# Patient Record
Sex: Female | Born: 1935
Health system: Southern US, Community
[De-identification: ages and names within clinical notes are randomized; demographics above are authoritative.]

## PROBLEM LIST (undated history)

## (undated) DIAGNOSIS — I82409 Acute embolism and thrombosis of unspecified deep veins of unspecified lower extremity: Secondary | ICD-10-CM

## (undated) DIAGNOSIS — S72009A Fracture of unspecified part of neck of unspecified femur, initial encounter for closed fracture: Secondary | ICD-10-CM

## (undated) DIAGNOSIS — M810 Age-related osteoporosis without current pathological fracture: Secondary | ICD-10-CM

## (undated) DIAGNOSIS — M51369 Other intervertebral disc degeneration, lumbar region without mention of lumbar back pain or lower extremity pain: Secondary | ICD-10-CM

## (undated) DIAGNOSIS — M5136 Other intervertebral disc degeneration, lumbar region: Secondary | ICD-10-CM

## (undated) DIAGNOSIS — K5730 Diverticulosis of large intestine without perforation or abscess without bleeding: Secondary | ICD-10-CM

## (undated) DIAGNOSIS — K56609 Unspecified intestinal obstruction, unspecified as to partial versus complete obstruction: Secondary | ICD-10-CM

## (undated) DIAGNOSIS — I2699 Other pulmonary embolism without acute cor pulmonale: Secondary | ICD-10-CM

## (undated) DIAGNOSIS — I10 Essential (primary) hypertension: Secondary | ICD-10-CM

## (undated) DIAGNOSIS — C55 Malignant neoplasm of uterus, part unspecified: Secondary | ICD-10-CM

---

## 1991-04-30 DIAGNOSIS — C55 Malignant neoplasm of uterus, part unspecified: Secondary | ICD-10-CM

## 1991-04-30 HISTORY — PX: ABDOMINAL HYSTERECTOMY: SHX81

## 1991-04-30 HISTORY — DX: Malignant neoplasm of uterus, part unspecified: C55

## 1997-10-18 ENCOUNTER — Other Ambulatory Visit: Admission: RE | Admit: 1997-10-18 | Discharge: 1997-10-18 | Payer: Self-pay | Admitting: *Deleted

## 2001-03-17 ENCOUNTER — Encounter: Payer: Self-pay | Admitting: Internal Medicine

## 2001-03-17 ENCOUNTER — Ambulatory Visit (HOSPITAL_COMMUNITY): Admission: RE | Admit: 2001-03-17 | Discharge: 2001-03-17 | Payer: Self-pay | Admitting: Internal Medicine

## 2001-05-05 ENCOUNTER — Encounter: Payer: Self-pay | Admitting: Internal Medicine

## 2001-05-05 ENCOUNTER — Ambulatory Visit (HOSPITAL_COMMUNITY): Admission: RE | Admit: 2001-05-05 | Discharge: 2001-05-05 | Payer: Self-pay | Admitting: Internal Medicine

## 2001-05-14 ENCOUNTER — Encounter: Payer: Self-pay | Admitting: Internal Medicine

## 2001-05-14 ENCOUNTER — Encounter: Admission: RE | Admit: 2001-05-14 | Discharge: 2001-05-14 | Payer: Self-pay | Admitting: Internal Medicine

## 2001-05-25 ENCOUNTER — Encounter: Admission: RE | Admit: 2001-05-25 | Discharge: 2001-05-25 | Payer: Self-pay | Admitting: Internal Medicine

## 2001-05-25 ENCOUNTER — Encounter: Payer: Self-pay | Admitting: Internal Medicine

## 2001-06-08 ENCOUNTER — Encounter: Payer: Self-pay | Admitting: Internal Medicine

## 2001-06-08 ENCOUNTER — Encounter: Admission: RE | Admit: 2001-06-08 | Discharge: 2001-06-08 | Payer: Self-pay | Admitting: Internal Medicine

## 2002-03-17 ENCOUNTER — Encounter: Payer: Self-pay | Admitting: Internal Medicine

## 2002-03-17 ENCOUNTER — Ambulatory Visit (HOSPITAL_COMMUNITY): Admission: RE | Admit: 2002-03-17 | Discharge: 2002-03-17 | Payer: Self-pay | Admitting: Internal Medicine

## 2002-09-01 ENCOUNTER — Encounter: Payer: Self-pay | Admitting: Radiology

## 2002-09-01 ENCOUNTER — Encounter: Payer: Self-pay | Admitting: Internal Medicine

## 2002-09-01 ENCOUNTER — Encounter: Admission: RE | Admit: 2002-09-01 | Discharge: 2002-09-01 | Payer: Self-pay | Admitting: Internal Medicine

## 2002-09-15 ENCOUNTER — Encounter: Admission: RE | Admit: 2002-09-15 | Discharge: 2002-09-15 | Payer: Self-pay | Admitting: Internal Medicine

## 2002-09-15 ENCOUNTER — Encounter: Payer: Self-pay | Admitting: Internal Medicine

## 2002-09-29 ENCOUNTER — Encounter: Admission: RE | Admit: 2002-09-29 | Discharge: 2002-09-29 | Payer: Self-pay | Admitting: Internal Medicine

## 2002-09-29 ENCOUNTER — Encounter: Payer: Self-pay | Admitting: Internal Medicine

## 2003-03-21 ENCOUNTER — Ambulatory Visit (HOSPITAL_COMMUNITY): Admission: RE | Admit: 2003-03-21 | Discharge: 2003-03-21 | Payer: Self-pay | Admitting: Internal Medicine

## 2003-03-30 ENCOUNTER — Ambulatory Visit (HOSPITAL_COMMUNITY): Admission: RE | Admit: 2003-03-30 | Discharge: 2003-03-30 | Payer: Self-pay | Admitting: Internal Medicine

## 2004-04-19 ENCOUNTER — Ambulatory Visit (HOSPITAL_COMMUNITY): Admission: RE | Admit: 2004-04-19 | Discharge: 2004-04-19 | Payer: Self-pay | Admitting: Internal Medicine

## 2004-04-29 HISTORY — PX: CHOLECYSTECTOMY: SHX55

## 2004-10-04 ENCOUNTER — Ambulatory Visit (HOSPITAL_COMMUNITY): Admission: RE | Admit: 2004-10-04 | Discharge: 2004-10-04 | Payer: Self-pay | Admitting: Internal Medicine

## 2004-11-14 ENCOUNTER — Ambulatory Visit: Payer: Self-pay | Admitting: Internal Medicine

## 2004-11-19 ENCOUNTER — Ambulatory Visit (HOSPITAL_COMMUNITY): Admission: RE | Admit: 2004-11-19 | Discharge: 2004-11-19 | Payer: Self-pay | Admitting: Internal Medicine

## 2004-12-17 ENCOUNTER — Ambulatory Visit: Payer: Self-pay | Admitting: Internal Medicine

## 2004-12-17 ENCOUNTER — Observation Stay (HOSPITAL_COMMUNITY): Admission: AD | Admit: 2004-12-17 | Discharge: 2004-12-18 | Payer: Self-pay | Admitting: Internal Medicine

## 2004-12-17 ENCOUNTER — Ambulatory Visit (HOSPITAL_COMMUNITY): Admission: RE | Admit: 2004-12-17 | Discharge: 2004-12-17 | Payer: Self-pay | Admitting: Ophthalmology

## 2004-12-21 ENCOUNTER — Emergency Department (HOSPITAL_COMMUNITY): Admission: EM | Admit: 2004-12-21 | Discharge: 2004-12-22 | Payer: Self-pay | Admitting: *Deleted

## 2004-12-26 ENCOUNTER — Ambulatory Visit: Payer: Self-pay | Admitting: Internal Medicine

## 2005-01-21 ENCOUNTER — Encounter (HOSPITAL_COMMUNITY): Admission: RE | Admit: 2005-01-21 | Discharge: 2005-01-21 | Payer: Self-pay | Admitting: Surgery

## 2005-02-11 ENCOUNTER — Ambulatory Visit (HOSPITAL_COMMUNITY): Admission: RE | Admit: 2005-02-11 | Discharge: 2005-02-11 | Payer: Self-pay | Admitting: Ophthalmology

## 2005-03-08 ENCOUNTER — Ambulatory Visit (HOSPITAL_COMMUNITY): Admission: RE | Admit: 2005-03-08 | Discharge: 2005-03-09 | Payer: Self-pay | Admitting: Surgery

## 2005-03-08 ENCOUNTER — Encounter (INDEPENDENT_AMBULATORY_CARE_PROVIDER_SITE_OTHER): Payer: Self-pay | Admitting: *Deleted

## 2005-04-25 ENCOUNTER — Ambulatory Visit (HOSPITAL_COMMUNITY): Admission: RE | Admit: 2005-04-25 | Discharge: 2005-04-25 | Payer: Self-pay | Admitting: Internal Medicine

## 2005-04-26 ENCOUNTER — Ambulatory Visit (HOSPITAL_COMMUNITY): Admission: RE | Admit: 2005-04-26 | Discharge: 2005-04-26 | Payer: Self-pay | Admitting: Internal Medicine

## 2005-04-29 DIAGNOSIS — K56609 Unspecified intestinal obstruction, unspecified as to partial versus complete obstruction: Secondary | ICD-10-CM

## 2005-04-29 HISTORY — PX: ABDOMINAL ADHESION SURGERY: SHX90

## 2005-04-29 HISTORY — PX: BOWEL RESECTION: SHX1257

## 2005-04-29 HISTORY — PX: APPENDECTOMY: SHX54

## 2005-04-29 HISTORY — DX: Unspecified intestinal obstruction, unspecified as to partial versus complete obstruction: K56.609

## 2005-04-29 HISTORY — PX: HIP SURGERY: SHX245

## 2005-08-27 ENCOUNTER — Ambulatory Visit (HOSPITAL_COMMUNITY): Admission: RE | Admit: 2005-08-27 | Discharge: 2005-08-27 | Payer: Self-pay | Admitting: Internal Medicine

## 2005-09-04 ENCOUNTER — Ambulatory Visit: Payer: Self-pay | Admitting: Internal Medicine

## 2005-09-12 ENCOUNTER — Ambulatory Visit: Payer: Self-pay | Admitting: Internal Medicine

## 2005-09-12 ENCOUNTER — Ambulatory Visit (HOSPITAL_COMMUNITY): Admission: RE | Admit: 2005-09-12 | Discharge: 2005-09-12 | Payer: Self-pay | Admitting: Internal Medicine

## 2005-09-17 ENCOUNTER — Ambulatory Visit (HOSPITAL_COMMUNITY): Admission: RE | Admit: 2005-09-17 | Discharge: 2005-09-17 | Payer: Self-pay | Admitting: Internal Medicine

## 2005-10-01 ENCOUNTER — Ambulatory Visit (HOSPITAL_COMMUNITY): Admission: RE | Admit: 2005-10-01 | Discharge: 2005-10-01 | Payer: Self-pay | Admitting: Internal Medicine

## 2005-10-02 ENCOUNTER — Ambulatory Visit (HOSPITAL_COMMUNITY): Admission: RE | Admit: 2005-10-02 | Discharge: 2005-10-02 | Payer: Self-pay | Admitting: Internal Medicine

## 2005-11-21 ENCOUNTER — Ambulatory Visit: Payer: Self-pay | Admitting: Internal Medicine

## 2006-02-03 ENCOUNTER — Encounter (INDEPENDENT_AMBULATORY_CARE_PROVIDER_SITE_OTHER): Payer: Self-pay | Admitting: *Deleted

## 2006-02-03 ENCOUNTER — Inpatient Hospital Stay (HOSPITAL_COMMUNITY): Admission: RE | Admit: 2006-02-03 | Discharge: 2006-02-10 | Payer: Self-pay | Admitting: Surgery

## 2006-03-10 IMAGING — NM NM HEPATO W/GB/PHARM/[PERSON_NAME]
2 series · 12 of 12 positions shown · non-contrast
Comparison: none

CLINICAL DATA: Epigastric pain.
 NUCLEAR MEDICINE HEPATOBILIARY SCAN WITH EJECTION FRACTION:
TECHNIQUE: Sequential abdominal images were obtained following intravenous injection of radiopharmaceutical.  Sequential images were continued following oral ingestion of 8 oz. half-and-half, and the gallbladder ejection fraction was calculated.
 Radiopharmaceutical:   5.0 mCi 2c-77m Choletec.

[Series 1: hepatobiliary · 3.20mm/px · 6 of 60 frames shown (1 of 2)]
[frame 6/60]
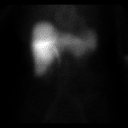
[frame 16/60]
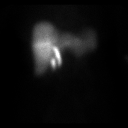
[frame 26/60]
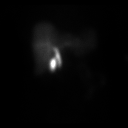
[frame 36/60]
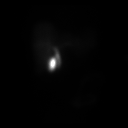
[frame 46/60]
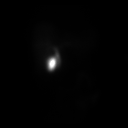
[frame 56/60]
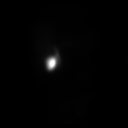

[Series 1: hepatobiliary · 3.20mm/px · 6 of 60 frames shown (2 of 2)]
[frame 6/60]
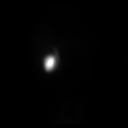
[frame 16/60]
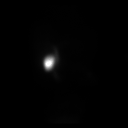
[frame 26/60]
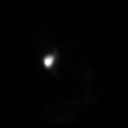
[frame 36/60]
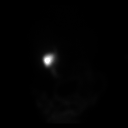
[frame 46/60]
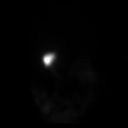
[frame 56/60]
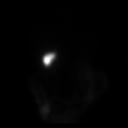

[12 of 12 positions shown; findings below may reference images not displayed]

FINDINGS: Prompt tracer extraction from blood stream indicating normal hepatocellular function.  
 Prompt excretion of tracer into the biliary tree.
 Gallbladder visualized by 15 minutes.
 Small bowel visualized by 25 minutes.
 No hepatic retention of tracer.  
 At one hour, the patient ingested Half and Half and imaging was continued for 60 minutes.
 Old mild emptying of tracer occurs from gallbladder following fatty meal stimulation.  
 Calculated gallbladder ejection fraction is decreased at 32%.
IMPRESSION: Patent biliary tree.
 Abnormal gallbladder response to fatty meal stimulation with decreased ejection fraction of 32%.

## 2006-04-25 ENCOUNTER — Ambulatory Visit: Payer: Self-pay | Admitting: Internal Medicine

## 2006-04-28 ENCOUNTER — Ambulatory Visit (HOSPITAL_COMMUNITY): Admission: RE | Admit: 2006-04-28 | Discharge: 2006-04-28 | Payer: Self-pay | Admitting: Internal Medicine

## 2006-08-27 ENCOUNTER — Ambulatory Visit: Payer: Self-pay | Admitting: Internal Medicine

## 2006-09-24 ENCOUNTER — Ambulatory Visit: Payer: Self-pay | Admitting: Internal Medicine

## 2007-05-21 ENCOUNTER — Ambulatory Visit (HOSPITAL_COMMUNITY): Admission: RE | Admit: 2007-05-21 | Discharge: 2007-05-21 | Payer: Self-pay | Admitting: Internal Medicine

## 2008-05-23 ENCOUNTER — Ambulatory Visit (HOSPITAL_COMMUNITY): Admission: RE | Admit: 2008-05-23 | Discharge: 2008-05-23 | Payer: Self-pay | Admitting: Internal Medicine

## 2008-12-31 ENCOUNTER — Inpatient Hospital Stay (HOSPITAL_COMMUNITY): Admission: EM | Admit: 2008-12-31 | Discharge: 2009-01-05 | Payer: Self-pay | Admitting: Orthopedic Surgery

## 2008-12-31 ENCOUNTER — Encounter: Payer: Self-pay | Admitting: Emergency Medicine

## 2009-01-05 ENCOUNTER — Inpatient Hospital Stay: Admission: AD | Admit: 2009-01-05 | Discharge: 2009-01-17 | Payer: Self-pay | Admitting: Internal Medicine

## 2009-06-05 ENCOUNTER — Ambulatory Visit (HOSPITAL_COMMUNITY): Admission: RE | Admit: 2009-06-05 | Discharge: 2009-06-05 | Payer: Self-pay | Admitting: Internal Medicine

## 2009-06-06 ENCOUNTER — Encounter (HOSPITAL_COMMUNITY): Admission: RE | Admit: 2009-06-06 | Discharge: 2009-07-06 | Payer: Self-pay | Admitting: Internal Medicine

## 2009-06-06 ENCOUNTER — Inpatient Hospital Stay (HOSPITAL_COMMUNITY): Admission: AD | Admit: 2009-06-06 | Discharge: 2009-06-10 | Payer: Self-pay | Admitting: Internal Medicine

## 2010-05-19 ENCOUNTER — Encounter: Payer: Self-pay | Admitting: Internal Medicine

## 2010-05-20 ENCOUNTER — Encounter: Payer: Self-pay | Admitting: Internal Medicine

## 2010-07-18 LAB — BETA-2-GLYCOPROTEIN I ABS, IGG/M/A
Beta-2 Glyco I IgG: <3 U/mL (ref <=15)
Beta-2-Glycoprotein I IgA: <3 U/mL (ref <=15)

## 2010-07-18 LAB — LUPUS ANTICOAGULANT PANEL: PTT Lupus Anticoagulant: 38.3 secs (ref 32.0–43.4)

## 2010-07-18 LAB — PROTIME-INR
INR: 1.1 (ref 0.00–1.49)
INR: 1.16 (ref 0.00–1.49)
INR: 2.14 — ABNORMAL HIGH (ref 0.00–1.49)
Prothrombin Time: 14.1 seconds (ref 11.6–15.2)
Prothrombin Time: 14.7 seconds (ref 11.6–15.2)
Prothrombin Time: 23.4 seconds — ABNORMAL HIGH (ref 11.6–15.2)

## 2010-07-18 LAB — BASIC METABOLIC PANEL
BUN: 25 mg/dL — ABNORMAL HIGH (ref 6–23)
CO2: 24 mEq/L (ref 19–32)
Calcium: 8.4 mg/dL (ref 8.4–10.5)
Chloride: 103 mEq/L (ref 96–112)
Creatinine, Ser: 1.27 mg/dL — ABNORMAL HIGH (ref 0.4–1.2)
GFR calc Af Amer: 48 mL/min — ABNORMAL LOW (ref >60)
Glucose, Bld: 103 mg/dL — ABNORMAL HIGH (ref 70–99)

## 2010-07-18 LAB — CBC
MCHC: 33.3 g/dL (ref 30.0–36.0)
MCV: 87.9 fL (ref 78.0–100.0)
RDW: 13.8 % (ref 11.5–15.5)

## 2010-07-18 LAB — PROTEIN C ACTIVITY: Protein C Activity: 160 % — ABNORMAL HIGH (ref 75–133)

## 2010-07-18 LAB — PROTEIN C, TOTAL: Protein C, Total: 98 % (ref 70–140)

## 2010-07-18 LAB — PROTEIN S, TOTAL: Protein S Ag, Total: 105 % (ref 70–140)

## 2010-07-18 LAB — PROTEIN S ACTIVITY: Protein S Activity: 87 % (ref 69–129)

## 2010-07-18 LAB — FACTOR 5 LEIDEN

## 2010-07-18 LAB — HOMOCYSTEINE: Homocysteine: 12.7 umol/L (ref 4.0–15.4)

## 2010-07-18 LAB — ANTITHROMBIN III: AntiThromb III Func: 95 % (ref 76–126)

## 2010-08-03 LAB — BASIC METABOLIC PANEL
BUN: 12 mg/dL (ref 6–23)
BUN: 13 mg/dL (ref 6–23)
CO2: 25 mEq/L (ref 19–32)
Chloride: 108 mEq/L (ref 96–112)
Creatinine, Ser: 1.24 mg/dL — ABNORMAL HIGH (ref 0.4–1.2)
Creatinine, Ser: 1.24 mg/dL — ABNORMAL HIGH (ref 0.4–1.2)
GFR calc non Af Amer: 41 mL/min — ABNORMAL LOW (ref >60)
Glucose, Bld: 96 mg/dL (ref 70–99)
Potassium: 3.8 mEq/L (ref 3.5–5.1)

## 2010-08-03 LAB — COMPREHENSIVE METABOLIC PANEL
AST: 35 U/L (ref 0–37)
Albumin: 3.9 g/dL (ref 3.5–5.2)
Alkaline Phosphatase: 48 U/L (ref 39–117)
BUN: 19 mg/dL (ref 6–23)
CO2: 20 mEq/L (ref 19–32)
Chloride: 107 mEq/L (ref 96–112)
Creatinine, Ser: 1.58 mg/dL — ABNORMAL HIGH (ref 0.4–1.2)
GFR calc Af Amer: 37 mL/min — ABNORMAL LOW (ref >60)
GFR calc non Af Amer: 31 mL/min — ABNORMAL LOW (ref >60)
Potassium: 3.9 mEq/L (ref 3.5–5.1)
Total Bilirubin: 0.7 mg/dL (ref 0.3–1.2)

## 2010-08-03 LAB — POCT CARDIAC MARKERS
CKMB, poc: 2.3 ng/mL (ref 1.0–8.0)
Myoglobin, poc: 286 ng/mL (ref 12–200)

## 2010-08-03 LAB — CBC
HCT: 26.4 % — ABNORMAL LOW (ref 36.0–46.0)
HCT: 36.7 % (ref 36.0–46.0)
MCHC: 33.3 g/dL (ref 30.0–36.0)
MCHC: 34.1 g/dL (ref 30.0–36.0)
MCV: 90.9 fL (ref 78.0–100.0)
MCV: 92 fL (ref 78.0–100.0)
MCV: 92.7 fL (ref 78.0–100.0)
Platelets: 161 10*3/uL (ref 150–400)
Platelets: 165 10*3/uL (ref 150–400)
Platelets: 189 10*3/uL (ref 150–400)
Platelets: 223 10*3/uL (ref 150–400)
RBC: 4.04 MIL/uL (ref 3.87–5.11)
WBC: 16.8 10*3/uL — ABNORMAL HIGH (ref 4.0–10.5)
WBC: 8.8 10*3/uL (ref 4.0–10.5)
WBC: 9.2 10*3/uL (ref 4.0–10.5)
WBC: 9.4 10*3/uL (ref 4.0–10.5)

## 2010-08-03 LAB — TYPE AND SCREEN: ABO/RH(D): AB POS

## 2010-08-03 LAB — URINALYSIS, ROUTINE W REFLEX MICROSCOPIC
Bilirubin Urine: NEGATIVE
Glucose, UA: NEGATIVE mg/dL
Ketones, ur: NEGATIVE mg/dL
Protein, ur: NEGATIVE mg/dL
pH: 5 (ref 5.0–8.0)

## 2010-08-03 LAB — DIFFERENTIAL
Basophils Absolute: 0 10*3/uL (ref 0.0–0.1)
Basophils Relative: 0 % (ref 0–1)
Eosinophils Relative: 0 % (ref 0–5)
Monocytes Absolute: 0.8 10*3/uL (ref 0.1–1.0)

## 2010-08-03 LAB — URINE MICROSCOPIC-ADD ON

## 2010-09-11 NOTE — Assessment & Plan Note (Signed)
NAME:  [name], GREENSPAN                 CHART#:  [id number]   DATE:  09/24/2006                       DOB:  Nov 24, 1918   PRESENTING COMPLAINT:  Follow up for diarrhea.   SUBJECTIVE:  The patient is an 77 year old Caucasian female who was last  seen on 08/27/2006 for diarrhea. She has had this problem since she had  resection of one foot of distal small bowel back in August 2007.  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  She has  been gradually losing weight.  She has lost 13 pounds in two years and 2  pounds in the last four weeks.   She had normal cbc, serum albumin and TSH levels.  She was given  prescription for Lomotil.  She was also given AnaMantle HC forte for  perianal application because of irritation due to her diarrhea.   She states she is starting to feel better.  She is still having as many  as six stools on her worst days.  She has had three on her best days and  averages three to four.  The first stool is always formed, second stool  is semi-formed and subsequent stools may be loose to watery.  She has  not passed any blood.  She denies abdominal pain.  She feels she has a  good appetite.  She is on low acid diet.  She has not experienced any  nausea or vomiting or abdominal pain that she was experiencing  intermittently prior to the surgery.  She reports no side effects with  Lomotil.   She is on MVI q.d., vitamin D 400 unites daily, calcium 1.8 gm daily,  ASA 81 mg daily, Benicar HCT 20 daily, Lomotil 1 b.i.d. p.r.n.   OBJECTIVE:  Weight 102 pounds, she is 5 feet 3 inches tall, pulse 88 per  minute, blood pressure 120/60, temperature 98.1. conjunctiva is pink,  sclera is nonicteric, oropharyngeal mucosa is normal.  No thyromegaly or  lymphadenopathy.  Bowel sounds are hyperactive.  Abdomen is soft and  nontender.  Aorta is easily palpated at epigastrium but not expanded.  She does not have clubbing, peripheral edema or skin rash.   ASSESSMENT:  Diarrhea since she had one foot of her distal small bowel  resected over six months ago.  Typically removal of this long a segment  should not cause diarrhea.  I believe the reason she has developed  diarrhea is because of underlying radiation enteritis.  It is reassuring  to know that she is improving with minimal intervention.  She is on low  dose and Lomotil and not having any problems.  I doubt that she will  need it chronically.  My only concern is gradual weight loss.  I would  like to see the trend reverse before we say that everything is back to  normal.  If weight loss continues, she may have to be evaluated for  malabsorption.   PLAN:  1. I would like for her to try to increase calorie intake.  She can      try yogurt, cheese, etc.  2. New prescription given for Lomotil 1 b.i.d. p.r.n. #60 with 1      refill.  She will stop by for a      weight check in one  month.  If weight loss continues, we will      proceed with B12, folate levels as well as trial with Xifaxan for      ten days prior to considering quantitative fecal fat analysis.       Hildred Laser, M.D.  Electronically Signed     NR/MEDQ  D:  09/24/2006  T:  09/25/2006  Job:  [id number]   cc:   Paula Compton. Willey Blade, MD

## 2010-09-14 NOTE — Op Note (Signed)
NAMELAKYSHA, RIVELLO                ACCOUNT NO.:  [id number]   MEDICAL RECORD NO.:  [id number]          PATIENT TYPE:  INP   LOCATION:  1410                         FACILITY:  [facility]   PHYSICIAN:  Haywood Lasso, M.D.DATE OF BIRTH:  Apr 05, 1919   DATE OF PROCEDURE:  02/03/2006  DATE OF DISCHARGE:                                 OPERATIVE REPORT   OFFICE MEDICAL RECORD NUMBER [id number] [phone].   PREOPERATIVE DIAGNOSIS:  Intermittent partial small bowel obstruction  secondary to adhesions.   POSTOPERATIVE DIAGNOSIS:  Intermittent partial small bowel obstruction  secondary to adhesions.  Plus radiation enteritis causing additional partial  small-bowel obstruction.   OPERATION:  Diagnostic laparoscopy with lysis of adhesions, accidental  enterotomy, exploratory laparotomy with repair of enterotomy, lysis of  additional adhesions, resection of terminal ileum with primary anastomosis,  incidental appendectomy.   SURGEON:  Dr. Margot Chimes   ASSISTANT:  Dr. Rise Patience   ANESTHESIA:  General endotracheal.   CLINICAL HISTORY:  Ms. Niebur is a 77 year old lady with intermittent  episodes that sounded suggestive of partial small-bowel obstruction and in  fact a series had shown a transition zone.  However, she would  intermittently be well and then had these episodes perhaps once every 6-8  weeks.  After having her most recent episode.  She elected to proceed to  surgical intervention.  We plan to do a diagnostic laparoscopy, lysis of  adhesions with possible laparotomy should that be needed.   DESCRIPTION OF PROCEDURE:  The patient was seen in the holding area.  She  had no further questions and we reviewed again the plans for surgery.  She  is taken to the operating room after satisfactory general endotracheal  anesthesia had been obtained, a Foley catheter was placed and the abdomen  prepped and draped.  The time-out occurred.   A small umbilical incision was made, a pursestring placed, Hasson  introduced, and the abdomen insufflated to 15.  The camera was placed.  I  could see multiple omental adhesions to the lower midline from the umbilicus  down.  I placed a 5-mm trocar in the right upper quadrant and one in the  right lower quadrant and put the camera with a 5 mm camera in the right  upper quadrant.  We began taking these down with the harmonic scalpel.  As  we got some of these down I went ahead and put a left lower quadrant 5 mm  trocar in so we could have better direction to work with and eventually got  all of these omental adhesions down.   At this point using [facility] dissectors, I was able to identify the terminal  ileum and went down to the pelvis and there seemed to be some adhesions down  there which initially I could not really evaluate any further.  I then began  to run the small bowel very gradually using the Glassman's in a hand-over-  hand technique.  Unfortunately about halfway up the small bowel we made a  tiny enterotomy.  At that point I elected to go ahead and open because I was  unclear about what I was going to need to do in the right lower quadrant and  felt this would be the best way to repair the small enterotomy.  A short  midline laparotomy was made.  The small area in the small bowel was  immediately identified and repaired with three sutures of 3-0 silk  Lambert's.   I continued running the small bowel proximally and found no other problems.  I then ran it distally and we encountered what we had noticed initially  which was radiation enteritis.  Near the terminal ileum there is a loop of  small bowel stuck down on another loop and appeared to be tethered.  Using  scissors I sharply divided the tether and freed up that loop and there was  what appeared to be a band-like across the small bowel here with an  impression on the small bowel. I think this was probably the majority of her  symptoms and intermittent obstruction at this point.  However  second loop of  more distal terminal ileum was also stuck down to what looked like the old  vaginal cuff or the bladder flap from her prior hysterectomy.  I went ahead  and sharply freed that up and it was very densely adherent to that bladder  flap.  Having gotten this up, this was very diseased bowel with severe  radiation enteritis and I thought probably was not best left in situ.  I  therefore decided to resect about a foot of her terminal ileum to get the  worst of this bowel out.   I made a small window in the mesentery of the small bowel at the ileocecal  junction and divided the small bowel with the GIA.  I then picked a site  proximal to where the tethering had been noted and divided the mesentery  with the LigaSure to that point.  I divided small bowel again with the GIA.   The appendix was sclerotic but I elected to go ahead and take this out at  this point and freed it up and divided it over a Kelly clamp, ligating the  base with 2-0 chromic and inverting it with a 3-0 silk.   Attention was then turned to doing an anastomosis.  I tacked the  antimesenteric border of the small bowel up against the cecum along the  tinea, impacted again further distally.  I opened up both the small bowel  and the colon, inserted the GIA and fired it.  This produced a nice long  staple line and there is no bleeding.   The common defect was tacked closed with a couple of 3-0 silks and the TA-60  used to close the common defect.  It was reinforced with some Lambert  sutures tucking this nicely.   At this point instruments and gloves were changed.  The mesenteric defect  was closed with some 3-0 silks.   I copiously irrigated, but there had been basically minimal contamination  with less than a mL of small bowel content having spilled from the  enterotomy that we had made earlier and essentially no enteric contents  spilled when we did the anastomosis.   I looked back at the raw surface down  by bladder flap and tried to  reperitonealize this with some 3-0 silk also to be sure we had good  hemostasis there.  At this point everything appeared to be dry.  We had a  nice patent anastomosis.   I used some  Seprafilm type material to place a small sheet up against the  raw surface where we had taken the small bowel off the bladder to see if we  could prevent another loop of small bowel sticking to that.  I then laid the  bowel gently back in its normal anatomic position and put the omentum down  over the small bowel so that this would protect the wound.  I also put the  other portion of the Seprafilm material across and on top of the omentum.  I  also inspected the omentum and there was no bleeding.   The wound was then closed with a running #1 fascial suture of Prolene.  The  wound was closed with staples on the skin with some Telfa wicks.   The patient tolerated procedure well.  There were no complications except  the single enterotomy made as mentioned above.  Estimated blood loss 150 mL.  All counts were correct.      Haywood Lasso, M.D.  Electronically Signed     CJS/MEDQ  D:  02/03/2006  T:  02/05/2006  Job:  [id number]   cc:   Paula Compton. Willey Blade, MD  [phone]   Hildred Laser, M.D.  [id number]  [place]  [place] [address]

## 2010-09-14 NOTE — Op Note (Signed)
NAMETARRIN, LECKNER                ACCOUNT NO.:  [id number]   MEDICAL RECORD NO.:  [id number]          PATIENT TYPE:  OIB   LOCATION:  [id number]                         FACILITY:  [facility]   PHYSICIAN:  Haywood Lasso, M.D.DATE OF BIRTH:  1918-12-31   DATE OF PROCEDURE:  03/08/2005  DATE OF DISCHARGE:                                 OPERATIVE REPORT   Office medical record #[id number].   PREOP DIAGNOSIS:  Biliary dyskinesia and biliary colic.   POSTOPERATIVE DIAGNOSIS:  Biliary dyskinesia and biliary colic.   OPERATION:  Laparoscopic cholecystectomy with operative cholangiogram.   SURGEON:  Haywood Lasso, M.D.   ASSISTANT:  Edsel Petrin. Dalbert Batman, M.D.   ANESTHESIA:  General.   CLINICAL HISTORY:  This is an 15-year lady who has intermittently been  having problems with abdominal pain, not too long ago.  Admitted, apparent  workup really could not confirmed gallstones, but she has had some decreased  gallbladder ejection fraction; and it was thought perhaps some of her  symptoms were biliary in origin. After discussion with the patient she  wished to proceed to cholecystectomy.   DESCRIPTION OF PROCEDURE:  The patient was seen in the holding area; and she  had no further questions. We confirmed that cholecystectomy was the planned  procedure.  The patient was taken to the operating room and after  satisfactory general endotracheal anesthesia had been obtained the abdomen  was prepped and draped. The time-out occurred.   I used 0.25% plain Marcaine for each incision. The umbilical incision was  made, the fascia opened; and the peritoneal cavity entered. A pursestring  was placed, the Hasson introduced; and the abdomen insufflated to 15. A  10/11 trocar was placed in the epigastrium and two 5 mm laterally under  direct vision. I was able to check and see that there were a few midline  adhesions but these had not interfered with the abdominal entry and did  appear to have any  bowel involved, just a few omental adhesions from her  prior surgery.   With the patient placed in reverse Trendelenburg and tilted to the left, the  gallbladder was grasped and retracted over the liver. The peritoneum over  the triangle of Calot was opened and the duct and artery dissected out; and  a large window made. There was a little delay waiting for the radiology tech  to come, so I freed up the peritoneum of the gallbladder in both directions;  and made a very large window while we were waiting on radiology. Once they  appeared, I went ahead and put a clip on the cystic duct at its junction  with the gallbladder, having dissected that out for its length. The main  cystic artery was also clipped and a small branch higher up also clipped.   The cystic duct was opened and a Cook catheter introduced percutaneously and  slipped into the cystic duct. It was held with a clip and operative  angiography done. There appeared to be some narrowing or spasm of the distal  duct or  perhaps a little bit of a  stricture, but ready flow of dye into the  duodenum. There were no stones noted; and no filling defects in the liver.   The cystic duct catheter was removed and three clips placed on the cystic  duct and it was divided. The cystic artery had additional clips placed on it  and it was divided. The small branch that we had previously clipped was then  divided. The gallbladder was then removed from below-to-above using  coagulation current of the cautery. Once this was disconnected, we irrigated  and made sure that everything was dry. The gallbladder was then brought out  the umbilical port. The abdomen was reinsufflated and the final check for  hemostasis made; and, again, everything appeared fine. The lateral ports  were removed under direct vision. The umbilical port was closed with a  pursestring. The abdomen was deflated through the epigastric port. Skin was  closed with 4-0 Monocryl  subcuticular plus Dermabond.   The patient tolerated the procedure well. There no operative complications.  All counts were correct.      Haywood Lasso, M.D.  Electronically Signed     CJS/MEDQ  D:  03/08/2005  T:  03/08/2005  Job:  [id number]   cc:   Paula Compton. Willey Blade, MD  Fax: [phone]

## 2010-09-14 NOTE — Op Note (Signed)
[name], [name]                ACCOUNT NO.:  [id number]   MEDICAL RECORD NO.:  [id number]          PATIENT TYPE:  AMB   LOCATION:  DAY                           FACILITY:  [facility]   PHYSICIAN:  Hildred Laser, M.D.    DATE OF BIRTH:  1919-02-26   DATE OF PROCEDURE:  09/12/2005  DATE OF DISCHARGE:                                 OPERATIVE REPORT   PROCEDURE:  Colonoscopy.   INDICATION:  [name] is an 76 year old Caucasian female who is undergoing  colonoscopy primarily for screening purposes.  Her last colonoscopy was over  ten years ago.  She is having intermittent episodes of severe abdominal pain  with nausea and vomiting.  She has undergone multiple studies without a  definite diagnosis.  She also had a laparoscopic cholecystectomy last year.  She had CT angio recently. She has disease to the celiac trunk and SMA,  possibly insignificant.  She also had dilated loops of small bowel with a  transition zone. Prior small bowel study was normal except for mildly  dilated loops of small bowel but this was over six months ago.  She will  have this study repeated in the future.  The procedure was reviewed with the  patient, informed consent was obtained.   MEDS FOR CONSCIOUS SEDATION:  Demerol 25 mg IV and Versed 3 mg IV.   FINDINGS:  The procedure was performed in the endoscopy suite.  The  patient's vital signs and O2 sat were monitored during the procedure and  remained stable.  The patient was placed in the left lateral position.  A  rectal examination was performed.  No abnormality was noted on external or  digital exam.  The Olympus videoscope was placed in the rectum and advanced  under vision into the sigmoid colon and beyond.  She had a few small  diverticula at the sigmoid colon.  Preparation was satisfactory to fair.  She had some areas noted with stool that had to be washed away.  The scope  was passed into the cecum which was identified by the ileocecal valve.  She  had to  be turned on the right side in order to look beyond the cecum.  A  picture of the ileocecal valve.  She had to be turned on the right side in  order to see beyond the cecum which was normal.  A picture of the  appendiceal orifice was taken for the record.  As the scope was withdrawn,  the colonic mucosa was examined for a second time and was normal throughout.  The rectal mucosa similarly was normal.  The scope was retroflexed to  examine the anorectal junction which was unremarkable.  The endoscope was  then withdrawn.  The patient tolerated the procedure well.   FINAL DIAGNOSIS:  A few tiny diverticula of the sigmoid colon, otherwise,  normal colonoscopy.   RECOMMENDATIONS:  She will have a small bowel follow through to further  evaluate the dilated loops of small bowel on recent CT angio.  I will also  have one of the interventional radiologists at [facility] to review  her CT angio  and see if it would be worthwhile to consider angiogram.      Hildred Laser, M.D.  Electronically Signed     NR/MEDQ  D:  09/12/2005  T:  09/12/2005  Job:  [id number]   cc:   Paula Compton. Willey Blade, MD  Fax: [phone]

## 2010-09-14 NOTE — H&P (Signed)
NAME:  [name], KOHEN                ACCOUNT NO.:  [id number]   MEDICAL RECORD NO.:  [id number]          PATIENT TYPE:  OBV   LOCATION:  A330                          FACILITY:  [facility]   PHYSICIAN:  Hildred Laser, M.D.    DATE OF BIRTH:  04/21/1919   DATE OF ADMISSION:  12/17/2004  DATE OF DISCHARGE:  LH                                HISTORY & PHYSICAL   PRESENTING COMPLAINT:  Abdominal pain and nausea and vomiting starting at 2  a.m. this morning.   HISTORY OF PRESENT ILLNESS:  [name] is an 77 year old Caucasian female  patient of Dr. Ria Comment who was last evaluated on November 14, 2004 for recurrent  episodes of upper and mid abdominal pain associated with nausea and  vomiting.  She has been getting these episodes every four-six months and  they have been going on for the last few years.  A prior evaluation by Dr.  Willey Blade had included an ultrasound on October 04, 2004, which was negative for  cholelithiasis or choledocholithiasis and she had mild fatty change  involving the liver and pancreas.  On October 04, 2004 she had had an MRI of her  brain because of an imbalance and she had extensive perivascular white  matter disease along with some lesions in the brain stem and cerebellum felt  to be microvascular ischemic change.  She did not have any intracranial  mass, etc.  Her CBC, comprehensive chemistry panel, and amylase were normal.  Following a visit to her office she had an upper GI with a small bowel  follow through on November 19, 2004, which was within normal limits.   The patient had cataract surgery by Dr. Iona Hansen this morning.  The patient  woke up around 2 a.m. with gripping pain in her upper and mid abdomen.  By  two hours later she started vomiting.  She has noticed some relief of her  discomfort every time she throws up.  She has been vomiting fluid and some  greenish-yellow material.  She has had chills previously but not with this  episode.  She had three normal bowel movements this  morning.  She denies  melena or rectal bleeding.  She states with previous episodes she may  actually develop constipation.  Each episode has lasted for several hours.   REVIEW OF SYSTEMS:  Negative for chest pain, dyspnea, or headache.  As far  as her weight is concerned, it has been unchanged since her last visit.   CURRENT MEDICATIONS:  She is on:  1.  MVI daily.  2.  Vitamin D 400 units daily.  3.  Calcium 500 or 600 mg daily.  4.  ASA 81 mg daily.   PAST MEDICAL HISTORY:  1.  History of uterine cancer status post hysterectomy and radiation therapy      in 1993.  2.  She had a colonoscopy by me in 1996 for heme-positive stools.  3.  She had two polyps removed, both of which are hyperplastic.  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   ALLERGIES:  NKDA.   FAMILY HISTORY:  Negative for colorectal carcinoma.   SOCIAL HISTORY:  She is widowed.  She is retired.  She has 3 children.  She  does not smoke cigarettes and does not drink alcohol.   PHYSICAL EXAMINATION:  GENERAL:  She does not appear to be in any distress.  She is holding a large pan just in case she vomits again.  VITAL SIGNS:  She weighs 116 pounds.  She is 6 feet 2-1/2 inches tall.  Pulse 100/minute, blood pressure 186/90, temperature is 98.  HEENT:  Conjunctivae are pink.  Sclerae is nonicteric.  Oral pharyngeal  mucosa is dry, otherwise normal.  NECK:  No neck masses or thyromegaly noted.  CARDIAC:  Regular rhythm.  Normal S1 and S2.  No murmur or gallop noted.  LUNGS:  Clear to auscultation.  ABDOMEN:  Symmetrical.  Bowel sounds are hyperactive.  On palpation she has  what appears to be a distended loop in the left mid abdomen.  She has mild-  to-moderate tenderness but no guarding or rebound noted.  No organomegaly.  RECTAL:  Reveals soft stool in the vault, which is guaiac-negative.  No  peripheral edema or clubbing noted.   ASSESSMENT:  [name] is an 77 year old  Caucasian female who presents with yet  another episode of upper and mid abdominal pain associated with nausea and  vomiting.  She has had these episodes every four-six months for the last few  years.  A small bowel obstruction is suspected secondary to adhesions and  possibly related to prior radiation therapy.  An evaluation in between these  episodes have been normal.  Now that she is symptomatic, maybe we can prove  that she has a partial small bowel obstruction.   PLAN:  1.  She will be admitted as an observation for IV fluids and pain control.  2.  We will try obtain an acute abdominal series as soon as possible.  We      will also check a CBC and Chem-20.  Further studies that are      interventional will depend on radiologic findings and her clinical      course.      Hildred Laser, M.D.  Electronically Signed     NR/MEDQ  D:  12/17/2004  T:  12/17/2004  Job:  [id number]   cc:   Paula Compton. Willey Blade, [place]  [place] [address]  Fax: [phone]

## 2010-09-14 NOTE — Discharge Summary (Signed)
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   CLINICAL HISTORY:  [name] is an 77 year old lady who has been having  intermittent episodes of abdominal pain with a very detailed workup.  Hour  while pain had been somewhat nonspecific, and she had a previous  laparoscopic cholecystectomy but no improvement in her symptoms.  One study  suggested that she had a partial distal small-bowel obstruction.  Because of  ongoing episodes of pain, she was admitted for laparoscopy and possible  laparotomy.   HOSPITAL COURSE:  The patient was admitted and taken to the operating room.  At the time of surgery, a laparoscopy was performed, but she had a very  dense adhesions in the pelvis and the right lower quadrant with two loops of  bowel stuck down, one tethered over the other, producing a partial small-  bowel obstruction.  In addition, this particular area had significant  radiation fibrosis-type changes secondary to her prior radiation therapy.   Once this was freed up, we resected from the segment terminal ileum that was  involved with the radiation enteritis and the partial obstruction and did a  primary ileocecal anastomosis.  The patient tolerated the procedure well.  At the time of surgery, there was a small accidental enterotomy made that  was repaired with a couple of sutures and produced no significant problem.   Postoperative course was fairly benign.  She had some usual postop soreness  and pain.  She was able to be ambulating.   Her NG was able to be  discontinued on the fourth postoperative day, and she was begun on liquids  and advanced diet.  She did develop some multiple loose stools, but this  thought most likely physiologic.  Two C. difficile tests were negative.  By  October 15, she was feeling better and wanted to go home.  She was  tolerating a solid diet.  She has only had a couple of stools over the last  12 hours and was having no abdominal pain.  On exam, she was afebrile, alert  and comfortable.  Her lungs were clear.  Her abdomen was soft and benign,  and her wound was healing nicely.   The patient was thought ready to be discharged.  She is to go home on her  usual home medications.  She is to follow up in my office in 2 weeks.  I  told her she can take some Kaopectate if she continues to have loose stools  and to call for any other problems or issues.   DATA REVIEWED:  Laboratory studies included preop hemoglobin of 13.8 and  postoperative of 10.8.  Electrolytes were basically unremarkable, and  urinalysis had been clear.   Pathology showed benign small bowel with luminal narrowing and adhesions.   Chest x-ray preoperatively had been normal.   EKG showed a normal EKG.   Of note is that we did take her appendix out at the time of her surgery.      Haywood Lasso, M.D.  Electronically Signed     CJS/MEDQ  D:  02/10/2006  T:  02/10/2006  Job:  [id number]   cc:   Paula Compton. Willey Blade, MD  [phone]   Hildred Laser, M.D.  [address]  [place]  [place] [address]

## 2010-09-14 NOTE — H&P (Signed)
[name], [name]                ACCOUNT NO.:  [id number]   MEDICAL RECORD NO.:  [id number]            PATIENT TYPE:   LOCATION:                                 FACILITY:   PHYSICIAN:  Hildred Laser, M.D.    DATE OF BIRTH:  12-Jul-1918   DATE OF ADMISSION:  DATE OF DISCHARGE:  LH                                HISTORY & PHYSICAL   PRESENTING COMPLAINT:  Follow-up for bouts of abdominal pain, nausea and  vomiting.   HISTORY OF PRESENT ILLNESS:  [name] is an 77 year old Caucasian female  patient of Dr. Ria Comment who is here for a scheduled visit.  She was initially  seen in July of2006 for these episodes.  The patient's symptoms started a  couple of years ago, but the beginning of last year she began to have them  on a monthly basis.  In between these episodes, she would be completely  asymptomatic.  She has undergone multiple studies which were negative  including ultrasound, abdominal CT, small-bowel follow-through.  Imaging  studies suggested mildly dilated small bowel without transition.  Hepatobiliary scan was abnormal.  She was sent over for a surgical  consultation.  She was seen by Dr. Margot Chimes in September of last year.  She  had a laparoscopic cholecystectomy.  He did not see any other abnormalities  at the time of surgery.  Pathology revealed the gallbladder to be normal.   The patient called 3 weeks ago stating that she was still having these  bouts.  She had one in [facility] 2006, another one in February of2007, and  most recently she had one episode about 4 weeks ago.  She says she has had a  few minor episodes in between which do not last long.  She indicated her  symptoms have not changed.  When she gets these spells, they may last for  several hours to a day or two.  Prior evaluations in the emergency room have  been non-revealing.  We decided to do a CT angio of the abdomen followed by  this visit.   Today, she is feeling fine.  She has a good appetite.  She denies  heartburn,  nausea, vomiting, melena or rectal bleeding.  Her bowels have changed since  she was on antihypertensives.  She has been having 4-5 semi-formed mushy  stools per day.  She feels that this is due to lisinopril.  She has cut back  the dose to 5 mg and states things are better, but the stools are still not  normal.  She is interested in having a colonoscopy.  Her last one was in  [place].  She is maintaining her weight.   MEDICATIONS:  1.  MVI daily.  2.  Vitamin D 400 units daily.  3.  Calcium daily.  4.  Aspirin 81 mg daily.  5.  Lisinopril 5 mg daily.   PAST MEDICAL HISTORY:  1.  She has hypertension.  2.  History of uterine cancer treated with hysterectomy followed by      radiation in 1993.  3.  She had  colonoscopy in 1996 which revealed 2 small polyps, but these      were non-edematous.  She had mild changes of irradiation colitis in the      sigmoid colon, but there was no stricture.  4.  She had a cholecystectomy in [facility] 2006.   ALLERGIES:  No known drug allergies.   FAMILY HISTORY:  Negative for colorectal carcinoma.   OBJECTIVE:  VITAL SIGNS:  Weight 115.5 pounds.  She is 5 feet, 3 inches  tall.  Pulse 84 per minute, blood pressure 120/70, temperature is 97.6.  HEENT:  Conjunctivae are pink.  Sclerae are nonicteric.  Oropharyngeal  mucosa is normal.  NECK:  No neck masses or thyromegaly noted.  CARDIAC:  Regular rhythm.  Normal S1 and S2.  No murmur or gallop noted.  LUNGS:  Clear to auscultation.  ABDOMEN:  Symmetrical.  Bowel sounds are normal.  No bruits noted.  On  palpation, it is soft and nontender.  No organomegaly or masses noted.  RECTAL:  Examination is deferred.  EXTREMITIES:  She does not have peripheral edema or clubbing.   CT angiogram of the abdomen was done Aug 27, 2005.  She has dilated loops of  small bowel within upper and mid abdomen, and transition appears to be a  distal small bowel.  High-grade narrowing to the origin of  the celiac artery  measuring 50-60% and narrowing to SMA proximally felt to be insignificant  and a patent IMA.  She had 2 small hypodense foci within the right _________  felt to be cysts.   She had a small-bowel study on December 22, 2004 which revealed mildly  prominent loops of small bowel with gradual transition to normal caliber but  no distinct lesion noted.   ASSESSMENT:  [name] is an 77 year old year old Caucasian female with intermittent  episodes of upper and mid abdominal pain with nausea and vomiting who has  undergone extensive evaluation without a definite diagnosis.  She even had a  cholecystectomy but without symptomatic improvement.  I suspect that she has  intermittent partial small bowel obstruction.  Unfortunately, we have not  been able to document that.  A recent CT angiogram shows narrowing to the  origin of the celiac artery, as well as SMA, but these lesions are not felt  to be critical.  Furthermore, her symptoms are not suggestive of abdominal  angina.   She is average risk for colon carcinoma.  The last colonoscopy was in  [phone], and I agree that this needs to be repeated.   PLAN:  Colonoscopy to be performed within the next 2 weeks, primarily for  screening purposes.   Should she have another episode of abdominal pain with nausea or vomiting,  she should report to the emergency room immediately for evaluation.      Hildred Laser, M.D.  Electronically Signed     NR/MEDQ  D:  09/04/2005  T:  09/05/2005  Job:  [id number]   cc:   Paula Compton. Willey Blade, MD  Fax: [phone]

## 2012-08-16 ENCOUNTER — Encounter (HOSPITAL_COMMUNITY): Payer: Self-pay | Admitting: *Deleted

## 2012-08-16 ENCOUNTER — Emergency Department (HOSPITAL_COMMUNITY): Payer: Medicare Other

## 2012-08-16 ENCOUNTER — Emergency Department (HOSPITAL_COMMUNITY)
Admission: EM | Admit: 2012-08-16 | Discharge: 2012-08-16 | Disposition: A | Discharge status: Home / Self Care | Payer: Medicare Other | Attending: Emergency Medicine | Admitting: Emergency Medicine

## 2012-08-16 DIAGNOSIS — Z79899 Other long term (current) drug therapy: Secondary | ICD-10-CM | POA: Insufficient documentation

## 2012-08-16 DIAGNOSIS — R109 Unspecified abdominal pain: Secondary | ICD-10-CM

## 2012-08-16 DIAGNOSIS — Z86711 Personal history of pulmonary embolism: Secondary | ICD-10-CM | POA: Insufficient documentation

## 2012-08-16 DIAGNOSIS — Z7901 Long term (current) use of anticoagulants: Secondary | ICD-10-CM | POA: Insufficient documentation

## 2012-08-16 DIAGNOSIS — M5137 Other intervertebral disc degeneration, lumbosacral region: Secondary | ICD-10-CM | POA: Insufficient documentation

## 2012-08-16 DIAGNOSIS — I10 Essential (primary) hypertension: Secondary | ICD-10-CM | POA: Insufficient documentation

## 2012-08-16 DIAGNOSIS — M810 Age-related osteoporosis without current pathological fracture: Secondary | ICD-10-CM | POA: Insufficient documentation

## 2012-08-16 DIAGNOSIS — N289 Disorder of kidney and ureter, unspecified: Secondary | ICD-10-CM

## 2012-08-16 DIAGNOSIS — Z8719 Personal history of other diseases of the digestive system: Secondary | ICD-10-CM | POA: Insufficient documentation

## 2012-08-16 DIAGNOSIS — Z8781 Personal history of (healed) traumatic fracture: Secondary | ICD-10-CM | POA: Insufficient documentation

## 2012-08-16 DIAGNOSIS — Z8542 Personal history of malignant neoplasm of other parts of uterus: Secondary | ICD-10-CM | POA: Insufficient documentation

## 2012-08-16 DIAGNOSIS — M51379 Other intervertebral disc degeneration, lumbosacral region without mention of lumbar back pain or lower extremity pain: Secondary | ICD-10-CM | POA: Insufficient documentation

## 2012-08-16 DIAGNOSIS — R110 Nausea: Secondary | ICD-10-CM | POA: Insufficient documentation

## 2012-08-16 DIAGNOSIS — R1032 Left lower quadrant pain: Secondary | ICD-10-CM | POA: Insufficient documentation

## 2012-08-16 HISTORY — DX: Essential (primary) hypertension: I10

## 2012-08-16 HISTORY — DX: Malignant neoplasm of uterus, part unspecified: C55

## 2012-08-16 HISTORY — DX: Other pulmonary embolism without acute cor pulmonale: I26.99

## 2012-08-16 HISTORY — DX: Diverticulosis of large intestine without perforation or abscess without bleeding: K57.30

## 2012-08-16 HISTORY — DX: Fracture of unspecified part of neck of unspecified femur, initial encounter for closed fracture: S72.009A

## 2012-08-16 HISTORY — DX: Unspecified intestinal obstruction, unspecified as to partial versus complete obstruction: K56.609

## 2012-08-16 HISTORY — DX: Other intervertebral disc degeneration, lumbar region: M51.36

## 2012-08-16 HISTORY — DX: Acute embolism and thrombosis of unspecified deep veins of unspecified lower extremity: I82.409

## 2012-08-16 HISTORY — DX: Other intervertebral disc degeneration, lumbar region without mention of lumbar back pain or lower extremity pain: M51.369

## 2012-08-16 HISTORY — DX: Age-related osteoporosis without current pathological fracture: M81.0

## 2012-08-16 LAB — CBC WITH DIFFERENTIAL/PLATELET
Basophils Absolute: 0 10*3/uL (ref 0.0–0.1)
Basophils Relative: 0 % (ref 0–1)
Eosinophils Relative: 0 % (ref 0–5)
HCT: 33 % — ABNORMAL LOW (ref 36.0–46.0)
MCHC: 34.5 g/dL (ref 30.0–36.0)
MCV: 86.6 fL (ref 78.0–100.0)
Monocytes Absolute: 0.3 10*3/uL (ref 0.1–1.0)
Neutro Abs: 7.4 10*3/uL (ref 1.7–7.7)
RDW: 13 % (ref 11.5–15.5)

## 2012-08-16 LAB — URINE MICROSCOPIC-ADD ON

## 2012-08-16 LAB — COMPREHENSIVE METABOLIC PANEL
AST: 25 U/L (ref 0–37)
Albumin: 3.6 g/dL (ref 3.5–5.2)
Calcium: 8.5 mg/dL (ref 8.4–10.5)
Creatinine, Ser: 1.86 mg/dL — ABNORMAL HIGH (ref 0.50–1.10)
Total Protein: 6.7 g/dL (ref 6.0–8.3)

## 2012-08-16 LAB — URINALYSIS, ROUTINE W REFLEX MICROSCOPIC
Bilirubin Urine: NEGATIVE
Glucose, UA: NEGATIVE mg/dL
Nitrite: NEGATIVE
Specific Gravity, Urine: 1.02 (ref 1.005–1.030)
pH: 5.5 (ref 5.0–8.0)

## 2012-08-16 LAB — LACTIC ACID, PLASMA: Lactic Acid, Venous: 1.4 mmol/L (ref 0.5–2.2)

## 2012-08-16 MED ORDER — SODIUM CHLORIDE 0.9 % IV SOLN
INTRAVENOUS | Status: DC
  Administered 2012-08-16: 09:00:00 via INTRAVENOUS

## 2012-08-16 MED ORDER — IOHEXOL 300 MG/ML  SOLN: 50.0000 mL | Freq: Once | INTRAMUSCULAR | Status: DC | PRN

## 2012-08-16 MED ORDER — IOHEXOL 300 MG/ML  SOLN: 100.0000 mL | Freq: Once | INTRAMUSCULAR | Status: DC | PRN

## 2012-08-16 MED ORDER — IOHEXOL 300 MG/ML  SOLN
50.0000 mL | Freq: Once | INTRAMUSCULAR | Status: AC | PRN
  Administered 2012-08-16: 50 mL via ORAL

## 2012-08-16 NOTE — ED Notes (Signed)
PO fluids offered, drinking without difficulty.  No complaints of nausea.

## 2012-08-16 NOTE — ED Notes (Signed)
[  pt c/o left lower abd pain associated with nausea, last bowel movement was during the night and "a little bit", also had a normal bowel movement yesterday per pt

## 2012-08-16 NOTE — ED Provider Notes (Signed)
History     CSN: [id number]  Arrival date & time 08/16/12  0827   First MD Initiated Contact with Patient 08/16/12 [phone]      Chief Complaint  Patient presents with  . Abdominal Pain     HPI Pt was seen at [facility].  Per pt, c/o gradual onset and resolution of one episode of left sided abd "pain" that began last night.  Pt describes the pain as "dull."  Abd pain was associated with nausea. States her last BM yesterday was normal for her. Denies any symptoms currently. Denies vomiting/diarrhea, no fevers, no back pain, no rash, no CP/SOB.    Past Medical History  Diagnosis Date  . Hypertension   . DVT (deep venous thrombosis)   . PE (pulmonary embolism)   . Hip fracture     left  . DDD (degenerative disc disease), lumbar   . Sigmoid diverticulosis   . SBO (small bowel obstruction) 2007  . Uterine cancer 1993    s/p radiation therapy and hysterectomy  . Osteoporosis     Past Surgical History  Procedure Laterality Date  . Hip surgery Left 2007    Total hip replacement  . Bowel resection  2007    terminal ileum (Dr. Margot Chimes)  . Abdominal adhesion surgery  2007    Dr. Margot Chimes  . Appendectomy  2007  . Abdominal hysterectomy  1993  . Cholecystectomy  2006     History  Substance Use Topics  . Smoking status: Never Smoker   . Smokeless tobacco: Not on file  . Alcohol Use: No      Review of Systems ROS: Statement: All systems negative except as marked or noted in the HPI; Constitutional: Negative for fever and chills. ; ; Eyes: Negative for eye pain, redness and discharge. ; ; ENMT: Negative for ear pain, hoarseness, nasal congestion, sinus pressure and sore throat. ; ; Cardiovascular: Negative for chest pain, palpitations, diaphoresis, dyspnea and peripheral edema. ; ; Respiratory: Negative for cough, wheezing and stridor. ; ; Gastrointestinal: +nausea, abd pain. Negative for vomiting, diarrhea, blood in stool, hematemesis, jaundice and rectal bleeding. . ; ; Genitourinary:  Negative for dysuria, flank pain and hematuria. ; ; Musculoskeletal: Negative for back pain and neck pain. Negative for swelling and trauma.; ; Skin: Negative for pruritus, rash, abrasions, blisters, bruising and skin lesion.; ; Neuro: Negative for headache, lightheadedness and neck stiffness. Negative for weakness, altered level of consciousness , altered mental status, extremity weakness, paresthesias, involuntary movement, seizure and syncope.       Allergies  Review of patient's allergies indicates no known allergies.  Home Medications   Current Outpatient Rx  Name  Route  Sig  Dispense  Refill  . diltiazem (DILACOR XR) 180 MG 24 hr capsule   Oral   Take 180 mg by mouth daily.         Marland Kitchen losartan-hydrochlorothiazide (HYZAAR) 100-25 MG per tablet   Oral   Take 1 tablet by mouth daily.         Marland Kitchen warfarin (COUMADIN) 3 MG tablet   Oral   Take 3 mg by mouth daily. Take 1.5 tablets PO Tuesday,  Wednesday, Friday and Saturday.  Take 1 tablet Monday and Thursday.           BP 175/48  Pulse 77  Temp(Src) 97 F (36.1 C) (Oral)  Resp 20  Ht 5\' 3"  (1.6 m)  Wt 105 lb (47.628 kg)  BMI 18.6 kg/m2  SpO2 98%  Physical Exam  0845: Physical examination:  Nursing notes reviewed; Vital signs and O2 SAT reviewed;  Constitutional: Well developed, Well nourished, Well hydrated, In no acute distress; Head:  Normocephalic, atraumatic; Eyes: EOMI, PERRL, No scleral icterus; ENMT: Mouth and pharynx normal, Mucous membranes moist; Neck: Supple, Full range of motion, No lymphadenopathy; Cardiovascular: Regular rate and rhythm, No gallop; Respiratory: Breath sounds clear & equal bilaterally, No rales, rhonchi, wheezes.  Speaking full sentences with ease, Normal respiratory effort/excursion; Chest: Nontender, Movement normal; Abdomen: Soft, Nontender, Nondistended, Normal bowel sounds; Genitourinary: No CVA tenderness; Extremities: Pulses normal, No tenderness, No edema, No calf edema or asymmetry.;  Neuro: AA&Ox3, Major CN grossly intact.  Speech clear. No gross focal motor or sensory deficits in extremities.; Skin: Color normal, Warm, Dry.   ED Course  Procedures    MDM  MDM Reviewed: previous chart, nursing note and vitals Reviewed previous: labs Interpretation: labs and CT scan   Results for orders placed during the hospital encounter of 08/16/12  URINALYSIS, ROUTINE W REFLEX MICROSCOPIC      Result Value Range   Color, Urine YELLOW  YELLOW   APPearance CLEAR  CLEAR   Specific Gravity, Urine 1.020  1.005 - 1.030   pH 5.5  5.0 - 8.0   Glucose, UA NEGATIVE  NEGATIVE mg/dL   Hgb urine dipstick LARGE (*) NEGATIVE   Bilirubin Urine NEGATIVE  NEGATIVE   Ketones, ur NEGATIVE  NEGATIVE mg/dL   Protein, ur NEGATIVE  NEGATIVE mg/dL   Urobilinogen, UA 0.2  0.0 - 1.0 mg/dL   Nitrite NEGATIVE  NEGATIVE   Leukocytes, UA NEGATIVE  NEGATIVE  CBC WITH DIFFERENTIAL      Result Value Range   WBC 8.6  4.0 - 10.5 K/uL   RBC 3.81 (*) 3.87 - 5.11 MIL/uL   Hemoglobin 11.4 (*) 12.0 - 15.0 g/dL   HCT 33.0 (*) 36.0 - 46.0 %   MCV 86.6  78.0 - 100.0 fL   MCH 29.9  26.0 - 34.0 pg   MCHC 34.5  30.0 - 36.0 g/dL   RDW 13.0  11.5 - 15.5 %   Platelets 253  150 - 400 K/uL   Neutrophils Relative 86 (*) 43 - 77 %   Neutro Abs 7.4  1.7 - 7.7 K/uL   Lymphocytes Relative 11 (*) 12 - 46 %   Lymphs Abs 0.9  0.7 - 4.0 K/uL   Monocytes Relative 3  3 - 12 %   Monocytes Absolute 0.3  0.1 - 1.0 K/uL   Eosinophils Relative 0  0 - 5 %   Eosinophils Absolute 0.0  0.0 - 0.7 K/uL   Basophils Relative 0  0 - 1 %   Basophils Absolute 0.0  0.0 - 0.1 K/uL  COMPREHENSIVE METABOLIC PANEL      Result Value Range   Sodium 135  135 - 145 mEq/L   Potassium 4.1  3.5 - 5.1 mEq/L   Chloride 98  96 - 112 mEq/L   CO2 24  19 - 32 mEq/L   Glucose, Bld 110 (*) 70 - 99 mg/dL   BUN 24 (*) 6 - 23 mg/dL   Creatinine, Ser 1.86 (*) 0.50 - 1.10 mg/dL   Calcium 8.5  8.4 - 10.5 mg/dL   Total Protein 6.7  6.0 - 8.3 g/dL    Albumin 3.6  3.5 - 5.2 g/dL   AST 25  0 - 37 U/L   ALT 12  0 - 35 U/L   Alkaline Phosphatase 53  39 -  117 U/L   Total Bilirubin 0.5  0.3 - 1.2 mg/dL   GFR calc non Af Amer 22 (*) >90 mL/min   GFR calc Af Amer 26 (*) >90 mL/min  LIPASE, BLOOD      Result Value Range   Lipase 39  11 - 59 U/L  LACTIC ACID, PLASMA      Result Value Range   Lactic Acid, Venous 1.4  0.5 - 2.2 mmol/L  URINE MICROSCOPIC-ADD ON      Result Value Range   Squamous Epithelial / LPF RARE  RARE   WBC, UA 0-2  <3 WBC/hpf   RBC / HPF TOO NUMEROUS TO COUNT  <3 RBC/hpf   Bacteria, UA FEW (*) RARE  PROTIME-INR      Result Value Range   Prothrombin Time 21.3 (*) 11.6 - 15.2 seconds   INR 1.93 (*) 0.00 - 1.49   Ct Abdomen Pelvis Wo Contrast 08/16/2012  *RADIOLOGY REPORT*  Clinical Data: Abdominal pain.  History of uterine cancer.  CT ABDOMEN AND PELVIS WITHOUT CONTRAST  Technique:  Multidetector CT imaging of the abdomen and pelvis was performed following the standard protocol without intravenous contrast.  Comparison: 06/09/2009  Findings: Scarring in the left lung base/lingula.  No pleural effusions.  Heart is normal size.  Prior cholecystectomy.  Small low-density lesion posteriorly in the right hepatic lobe is stable since prior study, likely small cyst. Spleen, pancreas, adrenals and kidneys have an unremarkable unenhanced appearance.  No renal or ureteral stones.  No hydronephrosis.  Prior hysterectomy and lymph node dissection.  No adnexal masses. No free fluid, free air or adenopathy.  Sigmoid diverticulosis.  No active diverticulitis.  Small bowel is decompressed.  Degenerative changes in the lumbar spine.  No acute bony abnormality.  IMPRESSION: No acute findings in the abdomen or pelvis.  Chronic changes as above.   Original Report Authenticated By: Rolm Baptise, M.D.    Results for METTA, PINGREE (MRN [id number]) as of 08/16/2012 13:31  Ref. Range 01/02/2009 05:42 06/07/2009 12:09 08/16/2012 08:55  BUN Latest Range: 6-23  mg/dL 12 25 (H) 24 (H)  Creatinine Latest Range: 0.50-1.10 mg/dL 1.24 (H) 1.27 (H) 1.86 (H)     1330:  BUN/Cr mildly elevated from previous.  Will need recheck of same by PMD this week.  Pt has tol PO well while in the ED without N/V.  Had one brown stool while in the ED.  Abd remains benign, VSS. Wants to go home now. Dx and testing d/w pt and family.  Questions answered.  Verb understanding, agreeable to d/c home with outpt f/u this week with PMD to recheck BUN/Cr.        Alfonzo Feller, DO 08/20/12 1646

## 2012-08-18 LAB — URINE CULTURE

## 2012-12-14 DIAGNOSIS — N200 Calculus of kidney: Secondary | ICD-10-CM | POA: Insufficient documentation

## 2012-12-14 DIAGNOSIS — I2699 Other pulmonary embolism without acute cor pulmonale: Secondary | ICD-10-CM | POA: Insufficient documentation

## 2012-12-14 DIAGNOSIS — J189 Pneumonia, unspecified organism: Secondary | ICD-10-CM | POA: Insufficient documentation

## 2012-12-14 DIAGNOSIS — R319 Hematuria, unspecified: Secondary | ICD-10-CM | POA: Insufficient documentation

## 2012-12-14 DIAGNOSIS — H9319 Tinnitus, unspecified ear: Secondary | ICD-10-CM | POA: Insufficient documentation

## 2012-12-14 DIAGNOSIS — R109 Unspecified abdominal pain: Secondary | ICD-10-CM | POA: Insufficient documentation

## 2012-12-14 DIAGNOSIS — I10 Essential (primary) hypertension: Secondary | ICD-10-CM | POA: Insufficient documentation

## 2012-12-15 ENCOUNTER — Other Ambulatory Visit (HOSPITAL_COMMUNITY): Payer: Self-pay | Admitting: Orthopedic Surgery

## 2012-12-15 DIAGNOSIS — M25511 Pain in right shoulder: Secondary | ICD-10-CM

## 2012-12-17 ENCOUNTER — Ambulatory Visit (HOSPITAL_COMMUNITY)
Admission: RE | Admit: 2012-12-17 | Discharge: 2012-12-17 | Disposition: A | Discharge status: Home / Self Care | Payer: Medicare Other | Source: Ambulatory Visit | Attending: Orthopedic Surgery | Admitting: Orthopedic Surgery

## 2012-12-17 DIAGNOSIS — M25619 Stiffness of unspecified shoulder, not elsewhere classified: Secondary | ICD-10-CM | POA: Insufficient documentation

## 2012-12-17 DIAGNOSIS — M25519 Pain in unspecified shoulder: Secondary | ICD-10-CM | POA: Insufficient documentation

## 2012-12-17 DIAGNOSIS — M25511 Pain in right shoulder: Secondary | ICD-10-CM

## 2012-12-17 DIAGNOSIS — M67919 Unspecified disorder of synovium and tendon, unspecified shoulder: Secondary | ICD-10-CM | POA: Insufficient documentation

## 2012-12-17 DIAGNOSIS — M719 Bursopathy, unspecified: Secondary | ICD-10-CM | POA: Insufficient documentation

## 2013-01-04 ENCOUNTER — Ambulatory Visit (HOSPITAL_COMMUNITY)
Admission: RE | Admit: 2013-01-04 | Discharge: 2013-01-04 | Disposition: A | Discharge status: Home / Self Care | Payer: Medicare Other | Source: Ambulatory Visit | Attending: Orthopedic Surgery | Admitting: Orthopedic Surgery

## 2013-01-04 DIAGNOSIS — I10 Essential (primary) hypertension: Secondary | ICD-10-CM | POA: Insufficient documentation

## 2013-01-04 DIAGNOSIS — IMO0001 Reserved for inherently not codable concepts without codable children: Secondary | ICD-10-CM | POA: Insufficient documentation

## 2013-01-04 DIAGNOSIS — M6281 Muscle weakness (generalized): Secondary | ICD-10-CM | POA: Insufficient documentation

## 2013-01-04 DIAGNOSIS — M75101 Unspecified rotator cuff tear or rupture of right shoulder, not specified as traumatic: Secondary | ICD-10-CM | POA: Insufficient documentation

## 2013-01-04 DIAGNOSIS — M25519 Pain in unspecified shoulder: Secondary | ICD-10-CM | POA: Insufficient documentation

## 2013-01-04 NOTE — Evaluation (Signed)
Occupational Therapy Evaluation  Patient Details  Name: [name] MRN: [id number] Date of Birth: 12-Mar-1919  Today's Date: 01/04/2013 Time: 1110-1140 OT Time Calculation (min): 30 min OT Evaluation 30' Visit#: 1 of 16  Re-eval: 02/01/13  Assessment Diagnosis: Right Chronic RTC Tear Prior Therapy: n/a  Authorization: [employer]  Authorization Time Period: before 10th visit  Authorization Visit#: 1 of 10   Past Medical History:  Past Medical History  Diagnosis Date  . Hypertension   . DVT (deep venous thrombosis)   . PE (pulmonary embolism)   . Hip fracture     left  . DDD (degenerative disc disease), lumbar   . Sigmoid diverticulosis   . SBO (small bowel obstruction) 2007  . Uterine cancer 1993    s/p radiation therapy and hysterectomy  . Osteoporosis    Past Surgical History:  Past Surgical History  Procedure Laterality Date  . Hip surgery Left 2007    Total hip replacement  . Bowel resection  2007    terminal ileum (Dr. Margot Chimes)  . Abdominal adhesion surgery  2007    Dr. Margot Chimes  . Appendectomy  2007  . Abdominal hysterectomy  1993  . Cholecystectomy  2006    Subjective S:  I fell against a chest of drawers over a year ago and hurt my shoulder.   Pertinent History: [name] has been experiencing decreased mobilty and increased pain in her right shoulder for more than a year.  She consulted with her PCP and was referred to Dr. Noemi Chapel.  Dr. Noemi Chapel has referred her to occupational therapy for evaluation and treatment.  Special Tests: FOTO Risk Adjusted Score is 51 Patient Stated Goals: " I want to get the pain out of my shoulder." Pain Assessment Currently in Pain?: Yes Pain Score: 7  Pain Location: Shoulder Pain Orientation: Right Pain Type: Acute pain  Precautions/Restrictions  Precautions Precautions: None Restrictions Weight Bearing Restrictions: No  Balance Screening Balance Screen Has the patient fallen in the past 6 months: No Has the  patient had a decrease in activity level because of a fear of falling? : No Is the patient reluctant to leave their home because of a fear of falling? 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   Dominant Hand: Right Vision - History Baseline Vision: Wears glasses all the time  Cognition/Observation Cognition Overall Cognitive Status: Within Functional Limits for tasks assessed  Sensation/Coordination/Edema Sensation Light Touch: Appears Intact Coordination Gross Motor Movements are Fluid and Coordinated: Yes Fine Motor Movements are Fluid and Coordinated: Yes  Additional Assessments RUE AROM (degrees) RUE Overall AROM Comments: assessed in supine, ER/IR with shoulder adducted Right Shoulder Flexion: 0 Degrees Right Shoulder ABduction: 0 Degrees Right Shoulder Internal Rotation: 60 Degrees Right Shoulder External Rotation: 60 Degrees RUE PROM (degrees) RUE Overall PROM Comments: assessed in supine ER/IR with shoulder adducted Right Shoulder Flexion: 95 Degrees Right Shoulder ABduction: 155 Degrees Right Shoulder Internal Rotation: 60 Degrees Right Shoulder External Rotation: 60 Degrees RUE Strength RUE Overall Strength Comments: strength not assessed this date.  Palpation Palpation: mod fascial restrictions in her right shoulder and scapular region.  Occupational Therapy Assessment and Plan OT Assessment and Plan Clinical Impression Statement: A: Patient presents with decreased use of her RUE due to decreased  mobility, strength, and increased pain and tightness in her RUE.   Pt will benefit from skilled therapeutic intervention in order to improve on the following deficits: Decreased strength;Decreased range of motion;Increased muscle spasms;Increased fascial restricitons;Pain Rehab Potential: Good OT Frequency: Min 2X/week OT Duration: 8 weeks OT Treatment/Interventions: Self-care/ADL training;Therapeutic exercise;Patient/family education;Manual therapy;Therapeutic activities OT Plan: P:  Skilled OT intervention to decrease pain and restrictions and improve pain free mobility needed to return to prior level of I with all B/IADLs, and leisure activities.  Treatment Plan:  MFR and manual stretching, supine PROM progress to AAROM, therapy ball, elev, ext, row.  Progress as tolerated.    Goals Short Term Goals Time to Complete Short Term Goals: 4 weeks Short Term Goal 1: Patient will be educated on a HEP. Short Term Goal 2: Patient will increase PROM to [facility] in her right shoulder for increased ability to complete activities at waist height.  Short Term Goal 3: Patient will have 3+/5 strength in her right shoulder region in order to complete yard work. Short Term Goal 4: Patient will decrease right shoulder pain to 5/10 during functional activities.  Short Term Goal 5: Patient will decrease fascial restrictions to min-mod in her right shoulder region.  Long Term Goals Time to Complete Long Term Goals: 8 weeks Long Term Goal 1: Patient will use her right arm as dominant with all functional activities.  Long Term Goal 2: Patient will increase AROM to [facility] in her right shoulder for increased ability to complete activities at shoulder height and above.  Long Term Goal 3: Patient will have 4/5 strength in her right shoulder region in order to complete yard work. Long Term Goal 4: Patient will decrease right shoulder pain to 2/10 during functional activities.  Long Term Goal 5: Patient will decrease fascial  restrictions to min in her right shoulder region.   Problem List Patient Active Problem List   Diagnosis Date Noted  . Right rotator cuff tear 01/04/2013  . Pain in joint, shoulder region 01/04/2013  . Muscle weakness (generalized) 01/04/2013    End of Session Activity Tolerance: Patient tolerated treatment well General Behavior During Therapy: [facility] for tasks assessed/performed OT Plan of Care OT Home Exercise Plan: towel slides.  Consulted and Agree with Plan of Care: Patient  GO Functional Assessment Tool Used: FOTO Risk Adjusted Score 51 Functional Limitation: Carrying, moving and handling objects Carrying, Moving and Handling Objects Current Status [id number]): At least 40 percent but less than 60 percent impaired, limited or restricted Carrying, Moving and Handling Objects Goal Status [phone]): At least 1 percent but less than 20 percent impaired, limited or restricted  Vangie Bicker, OTR/L  01/04/2013, 5:45 PM  Physician Documentation Your signature is required to indicate approval of the treatment plan as stated above.  Please sign and either send electronically or make a copy of this report for your files and return this physician signed original.  Please mark one 1.__approve of plan  2. ___approve of plan with the following conditions.   ______________________________  _____________________ Physician Signature                                                                                                             Date

## 2013-01-06 ENCOUNTER — Ambulatory Visit (HOSPITAL_COMMUNITY)
Admission: RE | Admit: 2013-01-06 | Discharge: 2013-01-06 | Disposition: A | Discharge status: Home / Self Care | Payer: Medicare Other | Source: Ambulatory Visit | Attending: Orthopedic Surgery | Admitting: Orthopedic Surgery

## 2013-01-06 DIAGNOSIS — M6281 Muscle weakness (generalized): Secondary | ICD-10-CM

## 2013-01-06 DIAGNOSIS — M75101 Unspecified rotator cuff tear or rupture of right shoulder, not specified as traumatic: Secondary | ICD-10-CM

## 2013-01-06 DIAGNOSIS — M25511 Pain in right shoulder: Secondary | ICD-10-CM

## 2013-01-06 NOTE — Progress Notes (Signed)
Occupational Therapy Treatment Patient Details  Name: [name] MRN: [id number] Date of Birth: 14-Feb-1919  Today's Date: 01/06/2013 Time: [id number] OT Time Calculation (min): 36 min MFR [id number] 11' Therex [id number] 25'  Visit#: 2 of 16  Re-eval: 02/01/13    Authorization: [employer]  Authorization Time Period: before 10th visit  Authorization Visit#: 2 of 10  Subjective Symptoms/Limitations Symptoms: S: I try to relax. I think I'm doing it but I'm not. Pain Assessment Currently in Pain?: Yes Pain Score: 7  Pain Location: Shoulder Pain Orientation: Right Pain Type: Acute pain  Precautions/Restrictions  Precautions Precautions: None  Exercise/Treatments Supine Protraction: PROM;10 reps Horizontal ABduction: PROM;10 reps External Rotation: PROM;10 reps Internal Rotation: PROM;10 reps Flexion: PROM;10 reps ABduction: PROM;10 reps Seated Elevation: AROM;10 reps Extension: AROM;10 reps Row: AROM;10 reps Therapy Ball Flexion: 10 reps ABduction: 10 reps ROM / Strengthening / Isometric Strengthening Thumb Tacks: 1' with therapist providing support Prot/Ret//Elev/Dep: 1' tactile cues needed       Manual Therapy Manual Therapy: Myofascial release Myofascial Release: MFR and manual stretching to right upper arm, scapularis, and trapezius to decrease fascial restrictions and increase joint mobility in a pain free zone.   Occupational Therapy Assessment and Plan OT Assessment and Plan Clinical Impression Statement: A: Pt with increased pain at end stretch. Patient required assist during thumb tacks and pro/ret/elev/dep for proper form and technique.  Rehab Potential: Good OT Frequency: Min 2X/week OT Duration: 8 weeks OT Treatment/Interventions: Self-care/ADL training;Therapeutic exercise;Patient/family education;Manual therapy;Therapeutic activities OT Plan: P: Add bridges and isometric exercises supine.    Goals Short Term Goals Time to Complete Short  Term Goals: 4 weeks Short Term Goal 1: Patient will be educated on a HEP. Short Term Goal 1 Progress: Progressing toward goal Short Term Goal 2: Patient will increase PROM to [facility] in her right shoulder for increased ability to complete activities at waist height.  Short Term Goal 2 Progress: Progressing toward goal Short Term Goal 3: Patient will have 3+/5 strength in her right shoulder region in order to complete yard work. Short Term Goal 3 Progress: Progressing toward goal Short Term Goal 4: Patient will decrease right shoulder pain to 5/10 during functional activities.  Short Term Goal 4 Progress: Progressing toward goal Short Term Goal 5: Patient will decrease fascial restrictions to min-mod in her right shoulder region.  Short Term Goal 5 Progress: Progressing toward goal Long Term Goals Time to Complete Long Term Goals: 8 weeks Long Term Goal 1: Patient will use her right arm as dominant with all functional activities.  Long Term Goal 1 Progress: Progressing toward goal Long Term Goal 2: Patient will increase AROM to [facility] in her right shoulder for increased ability to complete activities at shoulder height and above.  Long Term Goal 2 Progress: Progressing toward goal Long Term Goal 3: Patient will have 4/5 strength in her right shoulder region in order to complete yard work. Long Term Goal 3 Progress: Progressing toward goal Long Term Goal 4: Patient will decrease right shoulder pain to 2/10 during functional activities.  Long Term Goal 4 Progress: Progressing toward goal Long Term Goal 5: Patient will decrease fascial restrictions to min in her right shoulder region.  Long Term Goal 5 Progress: Progressing toward goal  Problem List Patient Active Problem List   Diagnosis Date Noted  . Right rotator cuff tear 01/04/2013  . Pain in joint, shoulder region 01/04/2013  . Muscle weakness (generalized) 01/04/2013    End of Session Activity Tolerance: Patient tolerated  treatment  well General Behavior During Therapy: [facility] for tasks assessed/performed   Ailene Ravel, OTR/L,CBIS   01/06/2013, 1:45 PM

## 2013-01-11 ENCOUNTER — Ambulatory Visit (HOSPITAL_COMMUNITY)
Admission: RE | Admit: 2013-01-11 | Discharge: 2013-01-11 | Disposition: A | Discharge status: Home / Self Care | Payer: Medicare Other | Source: Ambulatory Visit | Attending: Orthopedic Surgery | Admitting: Orthopedic Surgery

## 2013-01-11 DIAGNOSIS — M75101 Unspecified rotator cuff tear or rupture of right shoulder, not specified as traumatic: Secondary | ICD-10-CM

## 2013-01-11 DIAGNOSIS — M6281 Muscle weakness (generalized): Secondary | ICD-10-CM

## 2013-01-11 NOTE — Progress Notes (Signed)
Occupational Therapy Treatment Patient Details  Name: MAECIE KILZER MRN: [id number] Date of Birth: 02-Apr-1919  Today's Date: 01/11/2013 Time: [id number] OT Time Calculation (min): 31 min Manual Therapy [id number] 10' THerapetuic exercisese [id number] 21' Visit#: 3 of 16  Re-eval: 02/01/13    Authorization: [employer]  Authorization Time Period: before 10th visit  Authorization Visit#: 3 of 10  Subjective  S:  Its not a sharp pain any longer.  Pain Assessment Currently in Pain?: Yes Pain Score: 5  Pain Location: Shoulder Pain Orientation: Right Pain Type: Acute pain  Precautions/Restrictions   progress as tolerated  Exercise/Treatments Supine Protraction: PROM;AAROM;10 reps (AAROM for all supine given by OT rather than dowel rod ) Horizontal ABduction: PROM;AAROM;10 reps External Rotation: PROM;AAROM;10 reps Internal Rotation: PROM;AAROM;10 reps Flexion: PROM;10 reps;AAROM;5 reps ABduction: PROM;10 reps Seated Elevation: AROM;15 reps Extension: AROM;15 reps Row: AROM;15 reps Pulleys Flexion: 1 minute Therapy Ball Flexion: 15 reps ABduction: 15 reps ROM / Strengthening / Isometric Strengthening Thumb Tacks: 1' with therapist providing support Prot/Ret//Elev/Dep: 1' tactile cues needed      Manual Therapy Manual Therapy: Myofascial release Myofascial Release: MFR and manual stretching to right upper arm, scapularis, and trapezius to decrease fascial restrictions and increase joint mobility in a pain free zone.  Occupational Therapy Assessment and Plan OT Assessment and Plan Clinical Impression Statement: A:  Increased pain with eccentric flexion and abduction contraction in supine PROM and AAROM.  OT Plan: P: Add bridges and isometric exercises supine.    Goals Short Term Goals Time to Complete Short Term Goals: 4 weeks Short Term Goal 1: Patient will be educated on a HEP. Short Term Goal 1 Progress: Progressing toward goal Short Term Goal 2: Patient will  increase PROM to [facility] in her right shoulder for increased ability to complete activities at waist height.  Short Term Goal 2 Progress: Progressing toward goal Short Term Goal 3: Patient will have 3+/5 strength in her right shoulder region in order to complete yard work. Short Term Goal 3 Progress: Progressing toward goal Short Term Goal 4: Patient will decrease right shoulder pain to 5/10 during functional activities.  Short Term Goal 4 Progress: Progressing toward goal Short Term Goal 5: Patient will decrease fascial restrictions to min-mod in her right shoulder region.  Short Term Goal 5 Progress: Progressing toward goal Long Term Goals Time to Complete Long Term Goals: 8 weeks Long Term Goal 1: Patient will use her right arm as dominant with all functional activities.  Long Term Goal 1 Progress: Progressing toward goal Long Term Goal 2: Patient will increase AROM to [facility] in her right shoulder for increased ability to complete activities at shoulder height and above.  Long Term Goal 2 Progress: Progressing toward goal Long Term Goal 3: Patient will have 4/5 strength in her right shoulder region in order to complete yard work. Long Term Goal 3 Progress: Progressing toward goal Long Term Goal 4: Patient will decrease right shoulder pain to 2/10 during functional activities.  Long Term Goal 4 Progress: Progressing toward goal Long Term Goal 5: Patient will decrease fascial restrictions to min in her right shoulder region.  Long Term Goal 5 Progress: Progressing toward goal  Problem List Patient Active Problem List   Diagnosis Date Noted  . Right rotator cuff tear 01/04/2013  . Pain in joint, shoulder region 01/04/2013  . Muscle weakness (generalized) 01/04/2013    End of Session Activity Tolerance: Patient tolerated treatment well General Behavior During Therapy: [facility] for tasks assessed/performed  GO  Vangie Bicker, OTR/L  01/11/2013, 1:41 PM

## 2013-01-13 ENCOUNTER — Ambulatory Visit (HOSPITAL_COMMUNITY)
Admission: RE | Admit: 2013-01-13 | Discharge: 2013-01-13 | Disposition: A | Discharge status: Home / Self Care | Payer: Medicare Other | Source: Ambulatory Visit | Attending: Orthopedic Surgery | Admitting: Orthopedic Surgery

## 2013-01-13 DIAGNOSIS — M75101 Unspecified rotator cuff tear or rupture of right shoulder, not specified as traumatic: Secondary | ICD-10-CM

## 2013-01-13 DIAGNOSIS — M25511 Pain in right shoulder: Secondary | ICD-10-CM

## 2013-01-13 DIAGNOSIS — M6281 Muscle weakness (generalized): Secondary | ICD-10-CM

## 2013-01-13 NOTE — Progress Notes (Signed)
Occupational Therapy Treatment Patient Details  Name: [name] MRN: [id number] Date of Birth: 09/07/1918  Today's Date: 01/13/2013 Time: [id number] OT Time Calculation (min): 41 min MFR [id number] 27' Therex [id number] 14'  Visit#: 4 of 16  Re-eval: 02/01/13    Authorization: [employer]  Authorization Time Period: before 10th visit  Authorization Visit#: 4 of 10  Subjective Symptoms/Limitations Symptoms: S: I don't have any pain if I'm not moving it.  Pain Assessment Currently in Pain?: No/denies  Precautions/Restrictions  Precautions Precautions: None  Exercise/Treatments Supine Protraction: PROM;AAROM;10 reps Horizontal ABduction: PROM;AAROM;10 reps External Rotation: PROM;AAROM;10 reps Internal Rotation: PROM;AAROM;10 reps Flexion: PROM;10 reps;AAROM;5 reps ABduction: PROM;10 reps Other Supine Exercises: bridging 20X Seated Elevation: AROM;15 reps Extension: AROM;15 reps Row: AROM;15 reps Pulleys Flexion: 1 minute ABduction: 1 minute Therapy Ball Flexion: 15 reps ABduction: 15 reps ROM / Strengthening / Isometric Strengthening Thumb Tacks: 1' with therapist providing support Flexion: 3X3" Extension: 3X3" External Rotation: 3X3" Internal Rotation: 3X3" ABduction: 3X3" ADduction: 3X3"      Manual Therapy Manual Therapy: Myofascial release Myofascial Release: MFR and manual stretching to right upper arm, scapularis, and trapezius to decrease fascial restrictions and increase joint mobility in a pain free zone  Occupational Therapy Assessment and Plan OT Assessment and Plan Clinical Impression Statement: A: patient experienced a painful catching sensation when completing PROM and AAROM flexion and abduction. Pain slightly subsided when therapist applied gentle traction to shoulder during downward motion of movement. Added isometric exercises supine and bridging. Patient tolerated well.  OT Plan: P: work on independence with thumb tacks and  pro/ret/elev/dep exercises.    Goals Short Term Goals Time to Complete Short Term Goals: 4 weeks Short Term Goal 1: Patient will be educated on a HEP. Short Term Goal 2: Patient will increase PROM to [facility] in her right shoulder for increased ability to complete activities at waist height.  Short Term Goal 3: Patient will have 3+/5 strength in her right shoulder region in order to complete yard work. Short Term Goal 4: Patient will decrease right shoulder pain to 5/10 during functional activities.  Short Term Goal 5: Patient will decrease fascial restrictions to min-mod in her right shoulder region.  Long Term Goals Time to Complete Long Term Goals: 8 weeks Long Term Goal 1: Patient will use her right arm as dominant with all functional activities.  Long Term Goal 2: Patient will increase AROM to [facility] in her right shoulder for increased ability to complete activities at shoulder height and above.  Long Term Goal 3: Patient will have 4/5 strength in her right shoulder region in order to complete yard work. Long Term Goal 4: Patient will decrease right shoulder pain to 2/10 during functional activities.  Long Term Goal 5: Patient will decrease fascial restrictions to min in her right shoulder region.   Problem List Patient Active Problem List   Diagnosis Date Noted  . Right rotator cuff tear 01/04/2013  . Pain in joint, shoulder region 01/04/2013  . Muscle weakness (generalized) 01/04/2013    End of Session Activity Tolerance: Patient tolerated treatment well General Behavior During Therapy: [facility] for tasks assessed/performed   Ailene Ravel, OTR/L,CBIS   01/13/2013, 3:42 PM

## 2013-01-18 ENCOUNTER — Ambulatory Visit (HOSPITAL_COMMUNITY)
Admission: RE | Admit: 2013-01-18 | Discharge: 2013-01-18 | Disposition: A | Discharge status: Home / Self Care | Payer: Medicare Other | Source: Ambulatory Visit | Attending: Internal Medicine | Admitting: Internal Medicine

## 2013-01-18 DIAGNOSIS — M75101 Unspecified rotator cuff tear or rupture of right shoulder, not specified as traumatic: Secondary | ICD-10-CM

## 2013-01-18 DIAGNOSIS — M25511 Pain in right shoulder: Secondary | ICD-10-CM

## 2013-01-18 DIAGNOSIS — M6281 Muscle weakness (generalized): Secondary | ICD-10-CM

## 2013-01-18 NOTE — Progress Notes (Signed)
Occupational Therapy Treatment Patient Details  Name: [name] MRN: [id number] Date of Birth: 1919-01-03  Today's Date: 01/18/2013 Time: [id number] OT Time Calculation (min): 41 min MFR [id number] 8' Therex [id number] 33'  Visit#: 5 of 16  Re-eval: 02/01/13    Authorization: [employer]  Authorization Time Period: before 10th visit  Authorization Visit#: 5 of 10  Subjective Symptoms/Limitations Symptoms: S: I can't rememeber if I ever had a catch in my shoulder before.   Precautions/Restrictions  Precautions Precautions: None  Exercise/Treatments Supine Protraction: PROM;AAROM;10 reps Horizontal ABduction: PROM;AAROM;10 reps External Rotation: PROM;AAROM;10 reps Internal Rotation: PROM;AAROM;10 reps Flexion: PROM;AAROM;10 reps ABduction: PROM;AAROM;10 reps (required therapist assistance) Other Supine Exercises: bridging 20X Seated Elevation: AROM;15 reps Extension: AROM;15 reps Row: AROM;15 reps Therapy Ball Flexion: 15 reps ABduction: 15 reps ROM / Strengthening / Isometric Strengthening Prot/Ret//Elev/Dep: 1' tactile cues needed       Manual Therapy Manual Therapy: Myofascial release Myofascial Release: MFR and manual stretching to right upper arm, scapularis, and trapezius to decrease fascial restrictions and increase joint mobility in a pain free zone  Occupational Therapy Assessment and Plan OT Assessment and Plan Clinical Impression Statement: A: Patient used dowel rod today to assist with supine AAROM. Patient needed therapist assist with AAROM abduction supine.  OT Plan: P: work on independence with thumb tacks.   Goals Short Term Goals Time to Complete Short Term Goals: 4 weeks Short Term Goal 1: Patient will be educated on a HEP. Short Term Goal 1 Progress: Progressing toward goal Short Term Goal 2: Patient will increase PROM to [facility] in her right shoulder for increased ability to complete activities at waist height.  Short Term Goal 2  Progress: Progressing toward goal Short Term Goal 3: Patient will have 3+/5 strength in her right shoulder region in order to complete yard work. Short Term Goal 3 Progress: Progressing toward goal Short Term Goal 4: Patient will decrease right shoulder pain to 5/10 during functional activities.  Short Term Goal 4 Progress: Progressing toward goal Short Term Goal 5: Patient will decrease fascial restrictions to min-mod in her right shoulder region.  Short Term Goal 5 Progress: Progressing toward goal Long Term Goals Time to Complete Long Term Goals: 8 weeks Long Term Goal 1: Patient will use her right arm as dominant with all functional activities.  Long Term Goal 1 Progress: Progressing toward goal Long Term Goal 2: Patient will increase AROM to [facility] in her right shoulder for increased ability to complete activities at shoulder height and above.  Long Term Goal 2 Progress: Progressing toward goal Long Term Goal 3: Patient will have 4/5 strength in her right shoulder region in order to complete yard work. Long Term Goal 3 Progress: Progressing toward goal Long Term Goal 4: Patient will decrease right shoulder pain to 2/10 during functional activities.  Long Term Goal 4 Progress: Progressing toward goal Long Term Goal 5: Patient will decrease fascial restrictions to min in her right shoulder region.  Long Term Goal 5 Progress: Progressing toward goal  Problem List Patient Active Problem List   Diagnosis Date Noted  . Right rotator cuff tear 01/04/2013  . Pain in joint, shoulder region 01/04/2013  . Muscle weakness (generalized) 01/04/2013    End of Session Activity Tolerance: Patient tolerated treatment well General Behavior During Therapy: [facility] for tasks assessed/performed   Ailene Ravel, OTR/L,CBIS   01/18/2013, 2:16 PM

## 2013-01-20 ENCOUNTER — Ambulatory Visit (HOSPITAL_COMMUNITY)
Admission: RE | Admit: 2013-01-20 | Discharge: 2013-01-20 | Disposition: A | Discharge status: Home / Self Care | Payer: Medicare Other | Source: Ambulatory Visit | Attending: Orthopedic Surgery | Admitting: Orthopedic Surgery

## 2013-01-20 DIAGNOSIS — M75101 Unspecified rotator cuff tear or rupture of right shoulder, not specified as traumatic: Secondary | ICD-10-CM

## 2013-01-20 DIAGNOSIS — M6281 Muscle weakness (generalized): Secondary | ICD-10-CM

## 2013-01-20 DIAGNOSIS — M25511 Pain in right shoulder: Secondary | ICD-10-CM

## 2013-01-20 NOTE — Progress Notes (Signed)
Occupational Therapy Treatment Patient Details  Name: [name] MRN: [id number] Date of Birth: 1919-01-29  Today's Date: 01/20/2013 Time: [id number] OT Time Calculation (min): 38 min MFR [id number] 13' Therex [id number] 25'  Visit#: 6 of 16  Re-eval: 02/01/13    Authorization: [employer]  Authorization Time Period: before 10th visit  Authorization Visit#: 6 of 10  Subjective Symptoms/Limitations Symptoms: S: My shoulder only hurts me when I'm moving it a certain way.  Pain Assessment Currently in Pain?: Yes Pain Score: 5  Pain Location: Shoulder Pain Orientation: Right Pain Type: Acute pain  Precautions/Restrictions  Precautions Precautions: None  Exercise/Treatments Supine Protraction: PROM;AAROM;10 reps Horizontal ABduction: PROM;AAROM;10 reps External Rotation: PROM;AAROM;10 reps Internal Rotation: PROM;AAROM;10 reps Flexion: PROM;AAROM;10 reps ABduction: PROM;AAROM;10 reps (therapist assisted) Other Supine Exercises: bridging 20X Seated Elevation: AROM;15 reps Extension: AROM;15 reps Row: AROM;15 reps Therapy Ball Flexion: 15 reps ABduction: 15 reps ROM / Strengthening / Isometric Strengthening         Manual Therapy Manual Therapy: Myofascial release Myofascial Release: MFR and manual stretching to right upper arm, scapularis, and trapezius to decrease fascial restrictions and increase joint mobility in a pain free zone  Occupational Therapy Assessment and Plan OT Assessment and Plan Clinical Impression Statement: A: Patient performed pulleys with less difficulty this date. Still experiencing occassional "popping and catching" in shoulder during active and passive movement.  OT Plan: P: work on independence with thumb tacks.   Goals Short Term Goals Time to Complete Short Term Goals: 4 weeks Short Term Goal 1: Patient will be educated on a HEP. Short Term Goal 2: Patient will increase PROM to [facility] in her right shoulder for increased ability to  complete activities at waist height.  Short Term Goal 3: Patient will have 3+/5 strength in her right shoulder region in order to complete yard work. Short Term Goal 4: Patient will decrease right shoulder pain to 5/10 during functional activities.  Short Term Goal 5: Patient will decrease fascial restrictions to min-mod in her right shoulder region.  Long Term Goals Time to Complete Long Term Goals: 8 weeks Long Term Goal 1: Patient will use her right arm as dominant with all functional activities.  Long Term Goal 2: Patient will increase AROM to [facility] in her right shoulder for increased ability to complete activities at shoulder height and above.  Long Term Goal 3: Patient will have 4/5 strength in her right shoulder region in order to complete yard work. Long Term Goal 4: Patient will decrease right shoulder pain to 2/10 during functional activities.  Long Term Goal 5: Patient will decrease fascial restrictions to min in her right shoulder region.   Problem List Patient Active Problem List   Diagnosis Date Noted  . Right rotator cuff tear 01/04/2013  . Pain in joint, shoulder region 01/04/2013  . Muscle weakness (generalized) 01/04/2013    End of Session Activity Tolerance: Patient tolerated treatment well General Behavior During Therapy: [facility] for tasks assessed/performed   Ailene Ravel, OTR/L,CBIS   01/20/2013, 2:18 PM

## 2013-01-25 ENCOUNTER — Ambulatory Visit (HOSPITAL_COMMUNITY)
Admission: RE | Admit: 2013-01-25 | Discharge: 2013-01-25 | Disposition: A | Discharge status: Home / Self Care | Payer: Medicare Other | Source: Ambulatory Visit | Attending: Orthopedic Surgery | Admitting: Orthopedic Surgery

## 2013-01-25 DIAGNOSIS — M75101 Unspecified rotator cuff tear or rupture of right shoulder, not specified as traumatic: Secondary | ICD-10-CM

## 2013-01-25 DIAGNOSIS — M6281 Muscle weakness (generalized): Secondary | ICD-10-CM

## 2013-01-25 DIAGNOSIS — M25511 Pain in right shoulder: Secondary | ICD-10-CM

## 2013-01-25 NOTE — Progress Notes (Signed)
Occupational Therapy Treatment Patient Details  Name: [name] MRN: [id number] Date of Birth: [birth year]  Today's Date: 01/25/2013 Time: [id number] OT Time Calculation (min): 35 min Manual Therapy 1305 - 1320 15' Therapeutic Exercises  1320-1340 20'  Visit#: 7 of 16  Re-eval: 02/01/13    Authorization: [employer]  Authorization Time Period: before 10th visit  Authorization Visit#: 7 of 10  Subjective  S:  Its feeling alot better. Pain Assessment Currently in Pain?: Yes Pain Score: 3  Pain Location: Shoulder Pain Orientation: Right Pain Type: Acute pain  Precautions/Restrictions   progress as tolerated  Exercise/Treatments Supine Protraction: PROM;AROM;10 reps Horizontal ABduction: PROM;AROM;10 reps External Rotation: PROM;AROM;10 reps Internal Rotation: PROM;AROM;10 reps Flexion: PROM;AROM;10 reps ABduction: PROM;AROM;10 reps Seated Elevation: AROM;15 reps Extension: AROM;15 reps Row: AROM;15 reps Protraction: AAROM;10 reps Horizontal ABduction: AAROM;10 reps External Rotation: AAROM;10 reps Internal Rotation: AAROM;10 reps Flexion: AAROM;10 reps Abduction: AAROM;10 reps Therapy Ball Flexion: 20 reps ABduction: 20 reps ROM / Strengthening / Isometric Strengthening Wall Wash: 1' with hand over hand facilitation from OTR/L Thumb Tacks: 1' with hand over hand assistance      Manual Therapy Manual Therapy: Myofascial release Myofascial Release: MFR and manual stretching to right upper arm, scapularis, and trapezius to decrease fascial restrictions and increase joint mobility in a pain free zone  Occupational Therapy Assessment and Plan OT Assessment and Plan Clinical Impression Statement: A:  Patient completed wall wash for 1' and required mod facilitation to maintain positioning by end of 1'.  Transitioned from [facility] to AROM in supine and added AAROM in seated.  OT Plan: P:  Reassess, improve I with AAROM in seated.     Goals Short Term Goals Time  to Complete Short Term Goals: 4 weeks Short Term Goal 1: Patient will be educated on a HEP. Short Term Goal 1 Progress: Progressing toward goal Short Term Goal 2: Patient will increase PROM to [facility] in her right shoulder for increased ability to complete activities at waist height.  Short Term Goal 2 Progress: Progressing toward goal Short Term Goal 3: Patient will have 3+/5 strength in her right shoulder region in order to complete yard work. Short Term Goal 3 Progress: Progressing toward goal Short Term Goal 4: Patient will decrease right shoulder pain to 5/10 during functional activities.  Short Term Goal 4 Progress: Progressing toward goal Short Term Goal 5: Patient will decrease fascial restrictions to min-mod in her right shoulder region.  Short Term Goal 5 Progress: Progressing toward goal Long Term Goals Time to Complete Long Term Goals: 8 weeks Long Term Goal 1: Patient will use her right arm as dominant with all functional activities.  Long Term Goal 1 Progress: Progressing toward goal Long Term Goal 2: Patient will increase AROM to [facility] in her right shoulder for increased ability to complete activities at shoulder height and above.  Long Term Goal 2 Progress: Progressing toward goal Long Term Goal 3: Patient will have 4/5 strength in her right shoulder region in order to complete yard work. Long Term Goal 3 Progress: Progressing toward goal Long Term Goal 4: Patient will decrease right shoulder pain to 2/10 during functional activities.  Long Term Goal 4 Progress: Progressing toward goal Long Term Goal 5: Patient will decrease fascial restrictions to min in her right shoulder region.  Long Term Goal 5 Progress: Progressing toward goal  Problem List Patient Active Problem List   Diagnosis Date Noted  . Right rotator cuff tear 01/04/2013  . Pain in joint, shoulder  region 01/04/2013  . Muscle weakness (generalized) 01/04/2013    End of Session Activity Tolerance: Patient  tolerated treatment well General Behavior During Therapy: [facility] for tasks assessed/performed  [place], OTR/L  01/25/2013, 1:46 PM

## 2013-01-27 ENCOUNTER — Ambulatory Visit (HOSPITAL_COMMUNITY)
Admission: RE | Admit: 2013-01-27 | Discharge: 2013-01-27 | Disposition: A | Discharge status: Home / Self Care | Payer: Medicare Other | Source: Ambulatory Visit | Attending: Internal Medicine | Admitting: Internal Medicine

## 2013-01-27 DIAGNOSIS — M75101 Unspecified rotator cuff tear or rupture of right shoulder, not specified as traumatic: Secondary | ICD-10-CM

## 2013-01-27 DIAGNOSIS — M25519 Pain in unspecified shoulder: Secondary | ICD-10-CM | POA: Insufficient documentation

## 2013-01-27 DIAGNOSIS — I10 Essential (primary) hypertension: Secondary | ICD-10-CM | POA: Insufficient documentation

## 2013-01-27 DIAGNOSIS — M6281 Muscle weakness (generalized): Secondary | ICD-10-CM | POA: Insufficient documentation

## 2013-01-27 DIAGNOSIS — IMO0001 Reserved for inherently not codable concepts without codable children: Secondary | ICD-10-CM | POA: Insufficient documentation

## 2013-01-27 DIAGNOSIS — M25511 Pain in right shoulder: Secondary | ICD-10-CM

## 2013-01-27 NOTE — Evaluation (Addendum)
Occupational Therapy Reassessment and Discharge  Patient Details  Name: [name] MRN: [id number] Date of Birth: 07/07/1918  Today's Date: 01/27/2013 Time: [id number] OT Time Calculation (min): 42 min MFR 1353-1406 13' Therex [id number] 10' Reassess 1426-1435 19'  Visit#: 8 of 16  Re-eval: 02/01/13  Assessment Diagnosis: Right Chronic RTC Tear  Authorization: [employer]  Authorization Time Period: before 10th visit  Authorization Visit#: 8 of 10   Past Medical History:  Past Medical History  Diagnosis Date  . Hypertension   . DVT (deep venous thrombosis)   . PE (pulmonary embolism)   . Hip fracture     left  . DDD (degenerative disc disease), lumbar   . Sigmoid diverticulosis   . SBO (small bowel obstruction) 2007  . Uterine cancer 1993    s/p radiation therapy and hysterectomy  . Osteoporosis    Past Surgical History:  Past Surgical History  Procedure Laterality Date  . Hip surgery Left 2007    Total hip replacement  . Bowel resection  2007    terminal ileum (Dr. Margot Chimes)  . Abdominal adhesion surgery  2007    Dr. Margot Chimes  . Appendectomy  2007  . Abdominal hysterectomy  1993  . Cholecystectomy  2006    Subjective Symptoms/Limitations Symptoms: S: Today I'm having difficulty with my arm. I have good days and bad days.  Pain Assessment Currently in Pain?: No/denies  Precautions/Restrictions  Precautions Precautions: None  Assessment Additional Assessments RUE AROM (degrees) RUE Overall AROM Comments: assessed in supine, ER/IR with shoulder adducted Right Shoulder Flexion: 10 Degrees (on eval: 0) Right Shoulder ABduction: 10 Degrees (on eval: 0) Right Shoulder Internal Rotation: 90 Degrees (on eval: 60) Right Shoulder External Rotation: 90 Degrees (on eval; 60) RUE PROM (degrees) Right Shoulder Flexion: 165 Degrees (on eval: 95) Right Shoulder ABduction: 170 Degrees (on eval: 170) Right Shoulder Internal Rotation: 90 Degrees (on eval: 90) Right  Shoulder External Rotation: 90 Degrees (on eval: 90) RUE Strength RUE Overall Strength Comments: strength not assessed this date. Unable to perform against gravity.  Palpation Palpation: Min fascial restrictions in right upper arm region.      Exercise/Treatments Supine Protraction: PROM;AAROM;10 reps Horizontal ABduction: PROM;AAROM;10 reps External Rotation: PROM;AAROM;10 reps Internal Rotation: PROM;AAROM;10 reps Flexion: PROM;AAROM;10 reps ABduction: PROM;AAROM;10 reps     Manual Therapy Manual Therapy: Myofascial release Myofascial Release: MFR and manual stretching to right upper arm, scapularis, and trapezius to decrease fascial restrictions and increase joint mobility in a pain free zone  Occupational Therapy Assessment and Plan OT Assessment and Plan Clinical Impression Statement: A: Patient has met 4/5 short term goals. No long term goals met this date. Patient was unable to complete AROM supine as she did at last tx session. Patient requests to be discharged this date as she was hoping for more improvement at 4 weeks.  OT Plan: P: D/C from therapy per patient's request.   Goals Short Term Goals Time to Complete Short Term Goals: 4 weeks Short Term Goal 1: Patient will be educated on a HEP. Short Term Goal 1 Progress: Met Short Term Goal 2: Patient will increase PROM to [facility] in her right shoulder for increased ability to complete activities at waist height.  Short Term Goal 2 Progress: Met Short Term Goal 3: Patient will have 3+/5 strength in her right shoulder region in order to complete yard work. Short Term Goal 3 Progress: Not met Short Term Goal 4: Patient will decrease right shoulder pain to 5/10 during functional activities.  Short Term Goal 4 Progress: Met Short Term Goal 5: Patient will decrease fascial restrictions to min-mod in her right shoulder region.  Short Term Goal 5 Progress: Met Long Term Goals Time to Complete Long Term Goals: 8 weeks Long Term  Goal 1: Patient will use her right arm as dominant with all functional activities.  Long Term Goal 1 Progress: Not met Long Term Goal 2: Patient will increase AROM to [facility] in her right shoulder for increased ability to complete activities at shoulder height and above.  Long Term Goal 2 Progress: Not met Long Term Goal 3: Patient will have 4/5 strength in her right shoulder region in order to complete yard work. Long Term Goal 3 Progress: Not met Long Term Goal 4: Patient will decrease right shoulder pain to 2/10 during functional activities.  Long Term Goal 4 Progress: Not met Long Term Goal 5: Patient will decrease fascial restrictions to min in her right shoulder region.  Long Term Goal 5 Progress: Not met  Problem List Patient Active Problem List   Diagnosis Date Noted  . Right rotator cuff tear 01/04/2013  . Pain in joint, shoulder region 01/04/2013  . Muscle weakness (generalized) 01/04/2013    End of Session Activity Tolerance: Patient tolerated treatment well General Behavior During Therapy: WFL for tasks assessed/performed  GO Functional Assessment Tool Used: FOTO score: 35/100. (on eval: 27/100) Functional Limitation: Carrying, moving and handling objects Carrying, Moving and Handling Objects Current Status [phone]): At least 40 percent but less than 60 percent impaired, limited or restricted Carrying, Moving and Handling Objects Goal Status [phone]): At least 1 percent but less than 20 percent impaired, limited or restricted Carrying, Moving and Handling Objects Discharge Status [phone]): At least 40 percent but less than 60 percent impaired, limited or restricted  Ailene Ravel, OTR/L,CBIS   01/27/2013, 3:09 PM  Physician Documentation Your signature is required to indicate approval of the treatment plan as stated above.  Please sign and either send electronically or make a copy of this report for your files and return this physician signed original.  Please mark one  1.__approve of plan  2. ___approve of plan with the following conditions.   ______________________________                                                          _____________________ Physician Signature                                                                                                             Date

## 2014-02-25 ENCOUNTER — Other Ambulatory Visit (HOSPITAL_COMMUNITY): Payer: Self-pay | Admitting: Internal Medicine

## 2014-02-25 ENCOUNTER — Ambulatory Visit (HOSPITAL_COMMUNITY)
Admission: RE | Admit: 2014-02-25 | Discharge: 2014-02-25 | Disposition: A | Discharge status: Home / Self Care | Payer: Commercial Managed Care - HMO | Source: Ambulatory Visit | Attending: Internal Medicine | Admitting: Internal Medicine

## 2014-02-25 DIAGNOSIS — M25562 Pain in left knee: Secondary | ICD-10-CM

## 2014-09-19 ENCOUNTER — Other Ambulatory Visit (HOSPITAL_COMMUNITY): Payer: Self-pay | Admitting: Internal Medicine

## 2014-09-19 ENCOUNTER — Ambulatory Visit (HOSPITAL_COMMUNITY)
Admission: RE | Admit: 2014-09-19 | Discharge: 2014-09-19 | Disposition: A | Discharge status: Home / Self Care | Payer: Medicare HMO | Source: Ambulatory Visit | Attending: Internal Medicine | Admitting: Internal Medicine

## 2014-09-19 DIAGNOSIS — M545 Low back pain: Secondary | ICD-10-CM

## 2014-10-04 ENCOUNTER — Ambulatory Visit (INDEPENDENT_AMBULATORY_CARE_PROVIDER_SITE_OTHER): Payer: Medicare HMO | Admitting: Orthopedic Surgery

## 2014-10-04 ENCOUNTER — Encounter: Payer: Self-pay | Admitting: Orthopedic Surgery

## 2014-10-04 VITALS — BP 137/72 | Ht 63.0 in | Wt 98.0 lb

## 2014-10-04 DIAGNOSIS — M545 Low back pain: Secondary | ICD-10-CM

## 2014-10-04 MED ORDER — IBUPROFEN 600 MG PO TABS: 600.0000 mg | ORAL_TABLET | Freq: Three times a day (TID) | ORAL | Status: DC

## 2014-10-04 NOTE — Patient Instructions (Signed)
Ibuprofen sent to pharmacy  Call [facility] therapy dept to schedule therapy

## 2014-10-04 NOTE — Progress Notes (Signed)
Patient ID: JENISA LAUBSCHER, female   DOB: [birth year], 81 y.o.   MRN: [id number] Patient ID: NIYONNA MCFATE, female   DOB: 1919-02-17, 81 y.o.   MRN: [id number] New patient new problem Chief Complaint  Patient presents with  . Back Pain    back pain X 1 month, no known injury, refer [facility]   Referring physician Dr. Willey [facility]  GREELEY MIXSON is a 81 y.o. female.   HPI Today we have a 81 year old female who was treated for lumbar disc disease and radiculopathy back in 2003 which included injections of epidural steroid-induced. Her MRI at that time showed a paracentral disc herniation on the left at L4-5 and spinal stenosis at L2 and 3 and L3 and 4 she had multiple areas of ligamentum flavum hypertrophy and facet disease.  She presents now with a four-week history of increasing lower back pain across the small of her back with no history of trauma and no history of leg pain or bowel or bladder dysfunction. She has a dull sharp aching pain which she rates a 10 and is unrelieved by hydrocodone although somewhat better with lying flat. Sitting seems to hurt the worst.  She does remember leaning over to get something off the floor and after that her pain started  She has an extensive positive review of systems pertinent areas include numbness tingling burning pain in the legs dizziness weakness and lightheadedness, joint pain limb pain muscle weakness back pain, she had a recent bout of urinary tract infection including hematuria. Frequent urination excessive nighttime urination difficulty urination and loss of bladder control was noted at that time but the urinary tract infection cleared as did those symptoms. She has chronic distention and abdominal pain constipation diarrhea nausea vomiting.  We do not note any extensive fevers after the bout of urinary tract infection she does note a recent weight loss and some night sweats Review of Systems See hpi  Past Medical History  Diagnosis  Date  . Hypertension   . DVT (deep venous thrombosis)   . PE (pulmonary embolism)   . Hip fracture     left  . DDD (degenerative disc disease), lumbar   . Sigmoid diverticulosis   . SBO (small bowel obstruction) 2007  . Uterine cancer 1993    s/p radiation therapy and hysterectomy  . Osteoporosis     Past Surgical History  Procedure Laterality Date  . Hip surgery Left 2007    Total hip replacement  . Bowel resection  2007    terminal ileum (Dr. Margot Chimes)  . Abdominal adhesion surgery  2007    Dr. Margot Chimes  . Appendectomy  2007  . Abdominal hysterectomy  1993  . Cholecystectomy  2006    History reviewed. No pertinent family history.  Social History History  Substance Use Topics  . Smoking status: Never Smoker   . Smokeless tobacco: Not on file  . Alcohol Use: No    No Known Allergies  Current Outpatient Prescriptions  Medication Sig Dispense Refill  . Calcium Carbonate-Vitamin D (CALCIUM + D PO) Take 1 tablet by mouth daily.    . cyanocobalamin (,VITAMIN B-12,) 1000 MCG/ML injection Inject 1,000 mcg into the muscle every 30 (thirty) days.    Marland Kitchen diltiazem (DILACOR XR) 180 MG 24 hr capsule Take 180 mg by mouth daily.    Marland Kitchen ibuprofen (ADVIL,MOTRIN) 600 MG tablet Take 1 tablet (600 mg total) by mouth 3 (three) times daily. 90 tablet 2  . loperamide (IMODIUM) 2  MG capsule Take 2 mg by mouth daily.    Marland Kitchen losartan-hydrochlorothiazide (HYZAAR) 100-25 MG per tablet Take 1 tablet by mouth daily.    Marland Kitchen warfarin (COUMADIN) 3 MG tablet Take 3-4.5 mg by mouth daily. Take 1.5 tablets PO Tuesday,  Wednesday, Friday and Saturday.  Take 1 tablet Monday and Thursday.     No current facility-administered medications for this visit.       Physical Exam Blood pressure 137/72, height 5\' 3"  (1.6 m), weight 98 lb (44.453 kg). Physical Exam The patient is well developed well nourished and well groomed. Orientation to person place and time is normal  Mood is pleasant. Ambulatory status she  ambulates with a walker. She has been fairly recumbent during this bout of back pain and recently over the last 2 days has been able to get up Lower extremities no abnormalities or visual or palpable leg lengths are equal. Hip flexion is normal knee range of motion is functional. Knees and hips are both stable on both sides. Motor exam is normal without atrophy or weakness. Skin normal sensation normal reflexes normal pulses normal  Back pain noted with tenderness over the lower back. Straight leg raises are negative.    Data Reviewed #1 imaging May 23 x-ray shows ALT levels of degenerative disc disease with scoliosis. Report read as degenerative disc disease diffuse with L4-L5 particularly bad and the convex lumbar curvature with a 16 curve associated with loss of bone density and osteopenia.  2 I described the MRI that I reviewed and is report in the history.    Assessment Encounter Diagnosis  Name Primary?  . Low back pain without sciatica, unspecified back pain laterality Yes    Plan At this point on medicine her to physical therapy on all see any surgical lesion. If she continues to have problems after therapy then MRI would be warranted and referral to spine surgeon would be appropriate at that point but at this point ibuprofen 600 mg 3 times a day #92 refills and physical therapy should be sufficient

## 2014-11-17 ENCOUNTER — Ambulatory Visit: Payer: Medicare HMO | Admitting: Orthopedic Surgery

## 2015-02-09 DIAGNOSIS — Z79899 Other long term (current) drug therapy: Secondary | ICD-10-CM | POA: Diagnosis not present

## 2015-02-09 DIAGNOSIS — I2699 Other pulmonary embolism without acute cor pulmonale: Secondary | ICD-10-CM | POA: Diagnosis not present

## 2015-02-09 DIAGNOSIS — I10 Essential (primary) hypertension: Secondary | ICD-10-CM | POA: Diagnosis not present

## 2015-02-16 DIAGNOSIS — N184 Chronic kidney disease, stage 4 (severe): Secondary | ICD-10-CM | POA: Diagnosis not present

## 2015-02-16 DIAGNOSIS — Z23 Encounter for immunization: Secondary | ICD-10-CM | POA: Diagnosis not present

## 2015-02-16 DIAGNOSIS — I10 Essential (primary) hypertension: Secondary | ICD-10-CM | POA: Diagnosis not present

## 2015-02-16 DIAGNOSIS — Z681 Body mass index (BMI) 19 or less, adult: Secondary | ICD-10-CM | POA: Diagnosis not present

## 2015-02-20 DIAGNOSIS — E538 Deficiency of other specified B group vitamins: Secondary | ICD-10-CM | POA: Diagnosis not present

## 2015-03-30 DIAGNOSIS — E538 Deficiency of other specified B group vitamins: Secondary | ICD-10-CM | POA: Diagnosis not present

## 2015-04-10 DIAGNOSIS — R69 Illness, unspecified: Secondary | ICD-10-CM | POA: Diagnosis not present

## 2015-05-02 DIAGNOSIS — E538 Deficiency of other specified B group vitamins: Secondary | ICD-10-CM | POA: Diagnosis not present

## 2015-06-06 DIAGNOSIS — E538 Deficiency of other specified B group vitamins: Secondary | ICD-10-CM | POA: Diagnosis not present

## 2015-06-13 DIAGNOSIS — Z79899 Other long term (current) drug therapy: Secondary | ICD-10-CM | POA: Diagnosis not present

## 2015-06-13 DIAGNOSIS — I10 Essential (primary) hypertension: Secondary | ICD-10-CM | POA: Diagnosis not present

## 2015-06-20 DIAGNOSIS — Z681 Body mass index (BMI) 19 or less, adult: Secondary | ICD-10-CM | POA: Diagnosis not present

## 2015-06-20 DIAGNOSIS — I10 Essential (primary) hypertension: Secondary | ICD-10-CM | POA: Diagnosis not present

## 2015-06-20 DIAGNOSIS — N184 Chronic kidney disease, stage 4 (severe): Secondary | ICD-10-CM | POA: Diagnosis not present

## 2015-07-06 DIAGNOSIS — E538 Deficiency of other specified B group vitamins: Secondary | ICD-10-CM | POA: Diagnosis not present

## 2015-08-07 DIAGNOSIS — E538 Deficiency of other specified B group vitamins: Secondary | ICD-10-CM | POA: Diagnosis not present

## 2015-09-11 DIAGNOSIS — E538 Deficiency of other specified B group vitamins: Secondary | ICD-10-CM | POA: Diagnosis not present

## 2015-10-10 DIAGNOSIS — N184 Chronic kidney disease, stage 4 (severe): Secondary | ICD-10-CM | POA: Diagnosis not present

## 2015-10-10 DIAGNOSIS — I10 Essential (primary) hypertension: Secondary | ICD-10-CM | POA: Diagnosis not present

## 2015-10-10 DIAGNOSIS — Z79899 Other long term (current) drug therapy: Secondary | ICD-10-CM | POA: Diagnosis not present

## 2015-10-12 DIAGNOSIS — E538 Deficiency of other specified B group vitamins: Secondary | ICD-10-CM | POA: Diagnosis not present

## 2015-10-19 DIAGNOSIS — Z681 Body mass index (BMI) 19 or less, adult: Secondary | ICD-10-CM | POA: Diagnosis not present

## 2015-10-19 DIAGNOSIS — M21372 Foot drop, left foot: Secondary | ICD-10-CM | POA: Diagnosis not present

## 2015-10-19 DIAGNOSIS — I10 Essential (primary) hypertension: Secondary | ICD-10-CM | POA: Diagnosis not present

## 2015-10-19 DIAGNOSIS — N184 Chronic kidney disease, stage 4 (severe): Secondary | ICD-10-CM | POA: Diagnosis not present

## 2015-10-23 DIAGNOSIS — N184 Chronic kidney disease, stage 4 (severe): Secondary | ICD-10-CM | POA: Diagnosis not present

## 2015-10-23 DIAGNOSIS — D649 Anemia, unspecified: Secondary | ICD-10-CM | POA: Diagnosis not present

## 2015-10-23 DIAGNOSIS — M150 Primary generalized (osteo)arthritis: Secondary | ICD-10-CM | POA: Diagnosis not present

## 2015-10-23 DIAGNOSIS — M2170 Unequal limb length (acquired), unspecified site: Secondary | ICD-10-CM | POA: Diagnosis not present

## 2015-10-23 DIAGNOSIS — M419 Scoliosis, unspecified: Secondary | ICD-10-CM | POA: Diagnosis not present

## 2015-10-23 DIAGNOSIS — I129 Hypertensive chronic kidney disease with stage 1 through stage 4 chronic kidney disease, or unspecified chronic kidney disease: Secondary | ICD-10-CM | POA: Diagnosis not present

## 2015-10-23 DIAGNOSIS — M21371 Foot drop, right foot: Secondary | ICD-10-CM | POA: Diagnosis not present

## 2015-10-26 DIAGNOSIS — I129 Hypertensive chronic kidney disease with stage 1 through stage 4 chronic kidney disease, or unspecified chronic kidney disease: Secondary | ICD-10-CM | POA: Diagnosis not present

## 2015-10-26 DIAGNOSIS — M150 Primary generalized (osteo)arthritis: Secondary | ICD-10-CM | POA: Diagnosis not present

## 2015-10-26 DIAGNOSIS — N184 Chronic kidney disease, stage 4 (severe): Secondary | ICD-10-CM | POA: Diagnosis not present

## 2015-10-26 DIAGNOSIS — M21371 Foot drop, right foot: Secondary | ICD-10-CM | POA: Diagnosis not present

## 2015-10-26 DIAGNOSIS — D649 Anemia, unspecified: Secondary | ICD-10-CM | POA: Diagnosis not present

## 2015-10-26 DIAGNOSIS — M419 Scoliosis, unspecified: Secondary | ICD-10-CM | POA: Diagnosis not present

## 2015-10-30 DIAGNOSIS — M419 Scoliosis, unspecified: Secondary | ICD-10-CM | POA: Diagnosis not present

## 2015-10-30 DIAGNOSIS — D649 Anemia, unspecified: Secondary | ICD-10-CM | POA: Diagnosis not present

## 2015-10-30 DIAGNOSIS — M150 Primary generalized (osteo)arthritis: Secondary | ICD-10-CM | POA: Diagnosis not present

## 2015-10-30 DIAGNOSIS — N184 Chronic kidney disease, stage 4 (severe): Secondary | ICD-10-CM | POA: Diagnosis not present

## 2015-10-30 DIAGNOSIS — M21371 Foot drop, right foot: Secondary | ICD-10-CM | POA: Diagnosis not present

## 2015-10-30 DIAGNOSIS — I129 Hypertensive chronic kidney disease with stage 1 through stage 4 chronic kidney disease, or unspecified chronic kidney disease: Secondary | ICD-10-CM | POA: Diagnosis not present

## 2015-11-01 DIAGNOSIS — I129 Hypertensive chronic kidney disease with stage 1 through stage 4 chronic kidney disease, or unspecified chronic kidney disease: Secondary | ICD-10-CM | POA: Diagnosis not present

## 2015-11-01 DIAGNOSIS — D649 Anemia, unspecified: Secondary | ICD-10-CM | POA: Diagnosis not present

## 2015-11-01 DIAGNOSIS — M150 Primary generalized (osteo)arthritis: Secondary | ICD-10-CM | POA: Diagnosis not present

## 2015-11-01 DIAGNOSIS — M419 Scoliosis, unspecified: Secondary | ICD-10-CM | POA: Diagnosis not present

## 2015-11-01 DIAGNOSIS — M21371 Foot drop, right foot: Secondary | ICD-10-CM | POA: Diagnosis not present

## 2015-11-01 DIAGNOSIS — N184 Chronic kidney disease, stage 4 (severe): Secondary | ICD-10-CM | POA: Diagnosis not present

## 2015-11-06 DIAGNOSIS — D649 Anemia, unspecified: Secondary | ICD-10-CM | POA: Diagnosis not present

## 2015-11-06 DIAGNOSIS — M21371 Foot drop, right foot: Secondary | ICD-10-CM | POA: Diagnosis not present

## 2015-11-06 DIAGNOSIS — M419 Scoliosis, unspecified: Secondary | ICD-10-CM | POA: Diagnosis not present

## 2015-11-06 DIAGNOSIS — I129 Hypertensive chronic kidney disease with stage 1 through stage 4 chronic kidney disease, or unspecified chronic kidney disease: Secondary | ICD-10-CM | POA: Diagnosis not present

## 2015-11-06 DIAGNOSIS — M150 Primary generalized (osteo)arthritis: Secondary | ICD-10-CM | POA: Diagnosis not present

## 2015-11-06 DIAGNOSIS — N184 Chronic kidney disease, stage 4 (severe): Secondary | ICD-10-CM | POA: Diagnosis not present

## 2015-11-08 DIAGNOSIS — R69 Illness, unspecified: Secondary | ICD-10-CM | POA: Diagnosis not present

## 2015-11-09 DIAGNOSIS — D649 Anemia, unspecified: Secondary | ICD-10-CM | POA: Diagnosis not present

## 2015-11-09 DIAGNOSIS — M21371 Foot drop, right foot: Secondary | ICD-10-CM | POA: Diagnosis not present

## 2015-11-09 DIAGNOSIS — M150 Primary generalized (osteo)arthritis: Secondary | ICD-10-CM | POA: Diagnosis not present

## 2015-11-09 DIAGNOSIS — M419 Scoliosis, unspecified: Secondary | ICD-10-CM | POA: Diagnosis not present

## 2015-11-09 DIAGNOSIS — I129 Hypertensive chronic kidney disease with stage 1 through stage 4 chronic kidney disease, or unspecified chronic kidney disease: Secondary | ICD-10-CM | POA: Diagnosis not present

## 2015-11-09 DIAGNOSIS — N184 Chronic kidney disease, stage 4 (severe): Secondary | ICD-10-CM | POA: Diagnosis not present

## 2015-11-13 DIAGNOSIS — E538 Deficiency of other specified B group vitamins: Secondary | ICD-10-CM | POA: Diagnosis not present

## 2015-11-14 DIAGNOSIS — M21371 Foot drop, right foot: Secondary | ICD-10-CM | POA: Diagnosis not present

## 2015-11-14 DIAGNOSIS — I129 Hypertensive chronic kidney disease with stage 1 through stage 4 chronic kidney disease, or unspecified chronic kidney disease: Secondary | ICD-10-CM | POA: Diagnosis not present

## 2015-11-14 DIAGNOSIS — N184 Chronic kidney disease, stage 4 (severe): Secondary | ICD-10-CM | POA: Diagnosis not present

## 2015-11-14 DIAGNOSIS — D649 Anemia, unspecified: Secondary | ICD-10-CM | POA: Diagnosis not present

## 2015-11-14 DIAGNOSIS — M150 Primary generalized (osteo)arthritis: Secondary | ICD-10-CM | POA: Diagnosis not present

## 2015-11-14 DIAGNOSIS — M419 Scoliosis, unspecified: Secondary | ICD-10-CM | POA: Diagnosis not present

## 2015-11-16 DIAGNOSIS — I129 Hypertensive chronic kidney disease with stage 1 through stage 4 chronic kidney disease, or unspecified chronic kidney disease: Secondary | ICD-10-CM | POA: Diagnosis not present

## 2015-11-16 DIAGNOSIS — M419 Scoliosis, unspecified: Secondary | ICD-10-CM | POA: Diagnosis not present

## 2015-11-16 DIAGNOSIS — M21371 Foot drop, right foot: Secondary | ICD-10-CM | POA: Diagnosis not present

## 2015-11-16 DIAGNOSIS — N184 Chronic kidney disease, stage 4 (severe): Secondary | ICD-10-CM | POA: Diagnosis not present

## 2015-11-16 DIAGNOSIS — M150 Primary generalized (osteo)arthritis: Secondary | ICD-10-CM | POA: Diagnosis not present

## 2015-11-16 DIAGNOSIS — D649 Anemia, unspecified: Secondary | ICD-10-CM | POA: Diagnosis not present

## 2015-11-20 DIAGNOSIS — M150 Primary generalized (osteo)arthritis: Secondary | ICD-10-CM | POA: Diagnosis not present

## 2015-11-20 DIAGNOSIS — M21371 Foot drop, right foot: Secondary | ICD-10-CM | POA: Diagnosis not present

## 2015-11-20 DIAGNOSIS — N184 Chronic kidney disease, stage 4 (severe): Secondary | ICD-10-CM | POA: Diagnosis not present

## 2015-11-20 DIAGNOSIS — I129 Hypertensive chronic kidney disease with stage 1 through stage 4 chronic kidney disease, or unspecified chronic kidney disease: Secondary | ICD-10-CM | POA: Diagnosis not present

## 2015-11-20 DIAGNOSIS — D649 Anemia, unspecified: Secondary | ICD-10-CM | POA: Diagnosis not present

## 2015-11-20 DIAGNOSIS — M419 Scoliosis, unspecified: Secondary | ICD-10-CM | POA: Diagnosis not present

## 2015-11-23 DIAGNOSIS — M21371 Foot drop, right foot: Secondary | ICD-10-CM | POA: Diagnosis not present

## 2015-11-23 DIAGNOSIS — N184 Chronic kidney disease, stage 4 (severe): Secondary | ICD-10-CM | POA: Diagnosis not present

## 2015-11-23 DIAGNOSIS — M150 Primary generalized (osteo)arthritis: Secondary | ICD-10-CM | POA: Diagnosis not present

## 2015-11-23 DIAGNOSIS — M419 Scoliosis, unspecified: Secondary | ICD-10-CM | POA: Diagnosis not present

## 2015-11-23 DIAGNOSIS — D649 Anemia, unspecified: Secondary | ICD-10-CM | POA: Diagnosis not present

## 2015-11-23 DIAGNOSIS — I129 Hypertensive chronic kidney disease with stage 1 through stage 4 chronic kidney disease, or unspecified chronic kidney disease: Secondary | ICD-10-CM | POA: Diagnosis not present

## 2015-11-28 DIAGNOSIS — E538 Deficiency of other specified B group vitamins: Secondary | ICD-10-CM | POA: Diagnosis not present

## 2016-01-02 DIAGNOSIS — E538 Deficiency of other specified B group vitamins: Secondary | ICD-10-CM | POA: Diagnosis not present

## 2016-01-12 DIAGNOSIS — Z79899 Other long term (current) drug therapy: Secondary | ICD-10-CM | POA: Diagnosis not present

## 2016-01-12 DIAGNOSIS — N184 Chronic kidney disease, stage 4 (severe): Secondary | ICD-10-CM | POA: Diagnosis not present

## 2016-01-19 DIAGNOSIS — Z23 Encounter for immunization: Secondary | ICD-10-CM | POA: Diagnosis not present

## 2016-01-19 DIAGNOSIS — N2581 Secondary hyperparathyroidism of renal origin: Secondary | ICD-10-CM | POA: Diagnosis not present

## 2016-01-19 DIAGNOSIS — N184 Chronic kidney disease, stage 4 (severe): Secondary | ICD-10-CM | POA: Diagnosis not present

## 2016-01-19 DIAGNOSIS — E875 Hyperkalemia: Secondary | ICD-10-CM | POA: Diagnosis not present

## 2016-02-06 DIAGNOSIS — E538 Deficiency of other specified B group vitamins: Secondary | ICD-10-CM | POA: Diagnosis not present

## 2016-03-05 DIAGNOSIS — E538 Deficiency of other specified B group vitamins: Secondary | ICD-10-CM | POA: Diagnosis not present

## 2016-04-18 DIAGNOSIS — E538 Deficiency of other specified B group vitamins: Secondary | ICD-10-CM | POA: Diagnosis not present

## 2016-05-06 DIAGNOSIS — I10 Essential (primary) hypertension: Secondary | ICD-10-CM | POA: Diagnosis not present

## 2016-05-06 DIAGNOSIS — Z79899 Other long term (current) drug therapy: Secondary | ICD-10-CM | POA: Diagnosis not present

## 2016-05-06 DIAGNOSIS — N184 Chronic kidney disease, stage 4 (severe): Secondary | ICD-10-CM | POA: Diagnosis not present

## 2016-05-10 DIAGNOSIS — E875 Hyperkalemia: Secondary | ICD-10-CM | POA: Diagnosis not present

## 2016-05-10 DIAGNOSIS — N184 Chronic kidney disease, stage 4 (severe): Secondary | ICD-10-CM | POA: Diagnosis not present

## 2016-05-10 DIAGNOSIS — Z681 Body mass index (BMI) 19 or less, adult: Secondary | ICD-10-CM | POA: Diagnosis not present

## 2016-05-10 DIAGNOSIS — I10 Essential (primary) hypertension: Secondary | ICD-10-CM | POA: Diagnosis not present

## 2016-05-20 DIAGNOSIS — E538 Deficiency of other specified B group vitamins: Secondary | ICD-10-CM | POA: Diagnosis not present

## 2016-06-24 DIAGNOSIS — E538 Deficiency of other specified B group vitamins: Secondary | ICD-10-CM | POA: Diagnosis not present

## 2016-07-23 DIAGNOSIS — E538 Deficiency of other specified B group vitamins: Secondary | ICD-10-CM | POA: Diagnosis not present

## 2016-08-06 DIAGNOSIS — E875 Hyperkalemia: Secondary | ICD-10-CM | POA: Diagnosis not present

## 2016-08-06 DIAGNOSIS — I10 Essential (primary) hypertension: Secondary | ICD-10-CM | POA: Diagnosis not present

## 2016-08-06 DIAGNOSIS — N184 Chronic kidney disease, stage 4 (severe): Secondary | ICD-10-CM | POA: Diagnosis not present

## 2016-08-06 DIAGNOSIS — Z79899 Other long term (current) drug therapy: Secondary | ICD-10-CM | POA: Diagnosis not present

## 2016-08-15 DIAGNOSIS — N184 Chronic kidney disease, stage 4 (severe): Secondary | ICD-10-CM | POA: Diagnosis not present

## 2016-08-15 DIAGNOSIS — E875 Hyperkalemia: Secondary | ICD-10-CM | POA: Diagnosis not present

## 2016-08-15 DIAGNOSIS — I10 Essential (primary) hypertension: Secondary | ICD-10-CM | POA: Diagnosis not present

## 2016-08-26 DIAGNOSIS — E538 Deficiency of other specified B group vitamins: Secondary | ICD-10-CM | POA: Diagnosis not present

## 2016-09-05 DIAGNOSIS — R69 Illness, unspecified: Secondary | ICD-10-CM | POA: Diagnosis not present

## 2016-10-03 DIAGNOSIS — E538 Deficiency of other specified B group vitamins: Secondary | ICD-10-CM | POA: Diagnosis not present

## 2016-10-22 DIAGNOSIS — R69 Illness, unspecified: Secondary | ICD-10-CM | POA: Diagnosis not present

## 2016-11-15 DIAGNOSIS — Z79899 Other long term (current) drug therapy: Secondary | ICD-10-CM | POA: Diagnosis not present

## 2016-11-15 DIAGNOSIS — E875 Hyperkalemia: Secondary | ICD-10-CM | POA: Diagnosis not present

## 2016-11-15 DIAGNOSIS — E538 Deficiency of other specified B group vitamins: Secondary | ICD-10-CM | POA: Diagnosis not present

## 2016-11-15 DIAGNOSIS — N184 Chronic kidney disease, stage 4 (severe): Secondary | ICD-10-CM | POA: Diagnosis not present

## 2016-11-15 DIAGNOSIS — I10 Essential (primary) hypertension: Secondary | ICD-10-CM | POA: Diagnosis not present

## 2016-11-26 DIAGNOSIS — E875 Hyperkalemia: Secondary | ICD-10-CM | POA: Diagnosis not present

## 2016-11-26 DIAGNOSIS — N184 Chronic kidney disease, stage 4 (severe): Secondary | ICD-10-CM | POA: Diagnosis not present

## 2016-12-05 DIAGNOSIS — Z79899 Other long term (current) drug therapy: Secondary | ICD-10-CM | POA: Diagnosis not present

## 2016-12-05 DIAGNOSIS — E875 Hyperkalemia: Secondary | ICD-10-CM | POA: Diagnosis not present

## 2016-12-05 DIAGNOSIS — N184 Chronic kidney disease, stage 4 (severe): Secondary | ICD-10-CM | POA: Diagnosis not present

## 2016-12-10 DIAGNOSIS — N184 Chronic kidney disease, stage 4 (severe): Secondary | ICD-10-CM | POA: Diagnosis not present

## 2016-12-10 DIAGNOSIS — I10 Essential (primary) hypertension: Secondary | ICD-10-CM | POA: Diagnosis not present

## 2016-12-10 DIAGNOSIS — E875 Hyperkalemia: Secondary | ICD-10-CM | POA: Diagnosis not present

## 2016-12-17 DIAGNOSIS — E538 Deficiency of other specified B group vitamins: Secondary | ICD-10-CM | POA: Diagnosis not present

## 2016-12-31 DIAGNOSIS — N184 Chronic kidney disease, stage 4 (severe): Secondary | ICD-10-CM | POA: Diagnosis not present

## 2016-12-31 DIAGNOSIS — Z79899 Other long term (current) drug therapy: Secondary | ICD-10-CM | POA: Diagnosis not present

## 2017-01-07 DIAGNOSIS — E875 Hyperkalemia: Secondary | ICD-10-CM | POA: Diagnosis not present

## 2017-01-07 DIAGNOSIS — I10 Essential (primary) hypertension: Secondary | ICD-10-CM | POA: Diagnosis not present

## 2017-01-21 DIAGNOSIS — E538 Deficiency of other specified B group vitamins: Secondary | ICD-10-CM | POA: Diagnosis not present

## 2017-02-26 DIAGNOSIS — E538 Deficiency of other specified B group vitamins: Secondary | ICD-10-CM | POA: Diagnosis not present

## 2017-03-10 DIAGNOSIS — E875 Hyperkalemia: Secondary | ICD-10-CM | POA: Diagnosis not present

## 2017-03-10 DIAGNOSIS — I10 Essential (primary) hypertension: Secondary | ICD-10-CM | POA: Diagnosis not present

## 2017-03-18 DIAGNOSIS — E875 Hyperkalemia: Secondary | ICD-10-CM | POA: Diagnosis not present

## 2017-03-18 DIAGNOSIS — N184 Chronic kidney disease, stage 4 (severe): Secondary | ICD-10-CM | POA: Diagnosis not present

## 2017-03-18 DIAGNOSIS — Z23 Encounter for immunization: Secondary | ICD-10-CM | POA: Diagnosis not present

## 2017-03-31 DIAGNOSIS — D538 Other specified nutritional anemias: Secondary | ICD-10-CM | POA: Diagnosis not present

## 2017-05-05 DIAGNOSIS — E538 Deficiency of other specified B group vitamins: Secondary | ICD-10-CM | POA: Diagnosis not present

## 2017-06-05 DIAGNOSIS — E538 Deficiency of other specified B group vitamins: Secondary | ICD-10-CM | POA: Diagnosis not present

## 2017-06-05 DIAGNOSIS — N184 Chronic kidney disease, stage 4 (severe): Secondary | ICD-10-CM | POA: Diagnosis not present

## 2017-06-05 DIAGNOSIS — Z79899 Other long term (current) drug therapy: Secondary | ICD-10-CM | POA: Diagnosis not present

## 2017-06-05 DIAGNOSIS — E875 Hyperkalemia: Secondary | ICD-10-CM | POA: Diagnosis not present

## 2017-06-12 DIAGNOSIS — Z6820 Body mass index (BMI) 20.0-20.9, adult: Secondary | ICD-10-CM | POA: Diagnosis not present

## 2017-06-12 DIAGNOSIS — E875 Hyperkalemia: Secondary | ICD-10-CM | POA: Diagnosis not present

## 2017-06-12 DIAGNOSIS — N184 Chronic kidney disease, stage 4 (severe): Secondary | ICD-10-CM | POA: Diagnosis not present

## 2017-07-07 DIAGNOSIS — E539 Vitamin B deficiency, unspecified: Secondary | ICD-10-CM | POA: Diagnosis not present

## 2017-07-30 DIAGNOSIS — M10042 Idiopathic gout, left hand: Secondary | ICD-10-CM | POA: Diagnosis not present

## 2017-08-11 DIAGNOSIS — E539 Vitamin B deficiency, unspecified: Secondary | ICD-10-CM | POA: Diagnosis not present

## 2017-09-02 DIAGNOSIS — N184 Chronic kidney disease, stage 4 (severe): Secondary | ICD-10-CM | POA: Diagnosis not present

## 2017-09-02 DIAGNOSIS — E875 Hyperkalemia: Secondary | ICD-10-CM | POA: Diagnosis not present

## 2017-09-02 DIAGNOSIS — I10 Essential (primary) hypertension: Secondary | ICD-10-CM | POA: Diagnosis not present

## 2017-09-02 DIAGNOSIS — Z79899 Other long term (current) drug therapy: Secondary | ICD-10-CM | POA: Diagnosis not present

## 2017-09-11 DIAGNOSIS — I10 Essential (primary) hypertension: Secondary | ICD-10-CM | POA: Diagnosis not present

## 2017-09-11 DIAGNOSIS — N184 Chronic kidney disease, stage 4 (severe): Secondary | ICD-10-CM | POA: Diagnosis not present

## 2017-09-15 DIAGNOSIS — E539 Vitamin B deficiency, unspecified: Secondary | ICD-10-CM | POA: Diagnosis not present

## 2017-09-17 DIAGNOSIS — R269 Unspecified abnormalities of gait and mobility: Secondary | ICD-10-CM | POA: Diagnosis not present

## 2017-09-17 DIAGNOSIS — H5200 Hypermetropia, unspecified eye: Secondary | ICD-10-CM | POA: Diagnosis not present

## 2017-09-17 DIAGNOSIS — R2681 Unsteadiness on feet: Secondary | ICD-10-CM | POA: Diagnosis not present

## 2017-09-17 DIAGNOSIS — R69 Illness, unspecified: Secondary | ICD-10-CM | POA: Diagnosis not present

## 2017-10-27 DIAGNOSIS — E539 Vitamin B deficiency, unspecified: Secondary | ICD-10-CM | POA: Diagnosis not present

## 2017-11-13 DIAGNOSIS — R197 Diarrhea, unspecified: Secondary | ICD-10-CM | POA: Diagnosis not present

## 2017-11-13 DIAGNOSIS — J069 Acute upper respiratory infection, unspecified: Secondary | ICD-10-CM | POA: Diagnosis not present

## 2017-11-27 DIAGNOSIS — L570 Actinic keratosis: Secondary | ICD-10-CM | POA: Diagnosis not present

## 2017-11-27 DIAGNOSIS — E538 Deficiency of other specified B group vitamins: Secondary | ICD-10-CM | POA: Diagnosis not present

## 2017-11-27 DIAGNOSIS — X32XXXD Exposure to sunlight, subsequent encounter: Secondary | ICD-10-CM | POA: Diagnosis not present

## 2017-12-09 DIAGNOSIS — I10 Essential (primary) hypertension: Secondary | ICD-10-CM | POA: Diagnosis not present

## 2017-12-09 DIAGNOSIS — Z79899 Other long term (current) drug therapy: Secondary | ICD-10-CM | POA: Diagnosis not present

## 2017-12-09 DIAGNOSIS — N184 Chronic kidney disease, stage 4 (severe): Secondary | ICD-10-CM | POA: Diagnosis not present

## 2017-12-18 DIAGNOSIS — N184 Chronic kidney disease, stage 4 (severe): Secondary | ICD-10-CM | POA: Diagnosis not present

## 2017-12-18 DIAGNOSIS — I10 Essential (primary) hypertension: Secondary | ICD-10-CM | POA: Diagnosis not present

## 2017-12-31 DIAGNOSIS — E538 Deficiency of other specified B group vitamins: Secondary | ICD-10-CM | POA: Diagnosis not present

## 2018-01-12 DIAGNOSIS — I8001 Phlebitis and thrombophlebitis of superficial vessels of right lower extremity: Secondary | ICD-10-CM | POA: Diagnosis not present

## 2018-02-05 DIAGNOSIS — Z23 Encounter for immunization: Secondary | ICD-10-CM | POA: Diagnosis not present

## 2018-02-05 DIAGNOSIS — E538 Deficiency of other specified B group vitamins: Secondary | ICD-10-CM | POA: Diagnosis not present

## 2018-02-12 DIAGNOSIS — H353131 Nonexudative age-related macular degeneration, bilateral, early dry stage: Secondary | ICD-10-CM | POA: Diagnosis not present

## 2018-02-12 DIAGNOSIS — Z961 Presence of intraocular lens: Secondary | ICD-10-CM | POA: Diagnosis not present

## 2018-03-17 DIAGNOSIS — N184 Chronic kidney disease, stage 4 (severe): Secondary | ICD-10-CM | POA: Diagnosis not present

## 2018-03-17 DIAGNOSIS — I10 Essential (primary) hypertension: Secondary | ICD-10-CM | POA: Diagnosis not present

## 2018-03-17 DIAGNOSIS — E538 Deficiency of other specified B group vitamins: Secondary | ICD-10-CM | POA: Diagnosis not present

## 2018-03-17 DIAGNOSIS — Z79899 Other long term (current) drug therapy: Secondary | ICD-10-CM | POA: Diagnosis not present

## 2018-03-24 DIAGNOSIS — N184 Chronic kidney disease, stage 4 (severe): Secondary | ICD-10-CM | POA: Diagnosis not present

## 2018-03-24 DIAGNOSIS — I129 Hypertensive chronic kidney disease with stage 1 through stage 4 chronic kidney disease, or unspecified chronic kidney disease: Secondary | ICD-10-CM | POA: Diagnosis not present

## 2018-04-20 DIAGNOSIS — E538 Deficiency of other specified B group vitamins: Secondary | ICD-10-CM | POA: Diagnosis not present

## 2018-04-20 DIAGNOSIS — H6120 Impacted cerumen, unspecified ear: Secondary | ICD-10-CM | POA: Diagnosis not present

## 2018-05-25 DIAGNOSIS — E538 Deficiency of other specified B group vitamins: Secondary | ICD-10-CM | POA: Diagnosis not present

## 2018-06-12 DIAGNOSIS — R69 Illness, unspecified: Secondary | ICD-10-CM | POA: Diagnosis not present

## 2018-06-15 DIAGNOSIS — R69 Illness, unspecified: Secondary | ICD-10-CM | POA: Diagnosis not present

## 2018-06-20 DIAGNOSIS — Z79899 Other long term (current) drug therapy: Secondary | ICD-10-CM | POA: Diagnosis not present

## 2018-06-20 DIAGNOSIS — N184 Chronic kidney disease, stage 4 (severe): Secondary | ICD-10-CM | POA: Diagnosis not present

## 2018-06-20 DIAGNOSIS — I10 Essential (primary) hypertension: Secondary | ICD-10-CM | POA: Diagnosis not present

## 2018-06-25 DIAGNOSIS — N184 Chronic kidney disease, stage 4 (severe): Secondary | ICD-10-CM | POA: Diagnosis not present

## 2018-06-25 DIAGNOSIS — I129 Hypertensive chronic kidney disease with stage 1 through stage 4 chronic kidney disease, or unspecified chronic kidney disease: Secondary | ICD-10-CM | POA: Diagnosis not present

## 2018-06-25 DIAGNOSIS — Z681 Body mass index (BMI) 19 or less, adult: Secondary | ICD-10-CM | POA: Diagnosis not present

## 2018-07-28 DIAGNOSIS — E538 Deficiency of other specified B group vitamins: Secondary | ICD-10-CM | POA: Diagnosis not present

## 2018-09-03 DIAGNOSIS — E538 Deficiency of other specified B group vitamins: Secondary | ICD-10-CM | POA: Diagnosis not present

## 2018-09-18 DIAGNOSIS — N184 Chronic kidney disease, stage 4 (severe): Secondary | ICD-10-CM | POA: Diagnosis not present

## 2018-09-18 DIAGNOSIS — Z79899 Other long term (current) drug therapy: Secondary | ICD-10-CM | POA: Diagnosis not present

## 2018-09-18 DIAGNOSIS — I10 Essential (primary) hypertension: Secondary | ICD-10-CM | POA: Diagnosis not present

## 2018-09-25 DIAGNOSIS — N184 Chronic kidney disease, stage 4 (severe): Secondary | ICD-10-CM | POA: Diagnosis not present

## 2018-09-25 DIAGNOSIS — I129 Hypertensive chronic kidney disease with stage 1 through stage 4 chronic kidney disease, or unspecified chronic kidney disease: Secondary | ICD-10-CM | POA: Diagnosis not present

## 2018-09-30 DIAGNOSIS — H524 Presbyopia: Secondary | ICD-10-CM | POA: Diagnosis not present

## 2018-09-30 DIAGNOSIS — Z9849 Cataract extraction status, unspecified eye: Secondary | ICD-10-CM | POA: Diagnosis not present

## 2018-09-30 DIAGNOSIS — H52223 Regular astigmatism, bilateral: Secondary | ICD-10-CM | POA: Diagnosis not present

## 2018-09-30 DIAGNOSIS — H3530 Unspecified macular degeneration: Secondary | ICD-10-CM | POA: Diagnosis not present

## 2018-09-30 DIAGNOSIS — H5212 Myopia, left eye: Secondary | ICD-10-CM | POA: Diagnosis not present

## 2018-09-30 DIAGNOSIS — Z961 Presence of intraocular lens: Secondary | ICD-10-CM | POA: Diagnosis not present

## 2018-09-30 DIAGNOSIS — H3552 Pigmentary retinal dystrophy: Secondary | ICD-10-CM | POA: Diagnosis not present

## 2018-09-30 DIAGNOSIS — H353131 Nonexudative age-related macular degeneration, bilateral, early dry stage: Secondary | ICD-10-CM | POA: Diagnosis not present

## 2018-09-30 DIAGNOSIS — H5201 Hypermetropia, right eye: Secondary | ICD-10-CM | POA: Diagnosis not present

## 2018-10-08 DIAGNOSIS — E538 Deficiency of other specified B group vitamins: Secondary | ICD-10-CM | POA: Diagnosis not present

## 2018-11-10 DIAGNOSIS — E538 Deficiency of other specified B group vitamins: Secondary | ICD-10-CM | POA: Diagnosis not present

## 2018-12-17 DIAGNOSIS — E538 Deficiency of other specified B group vitamins: Secondary | ICD-10-CM | POA: Diagnosis not present

## 2018-12-22 DIAGNOSIS — Z79899 Other long term (current) drug therapy: Secondary | ICD-10-CM | POA: Diagnosis not present

## 2018-12-22 DIAGNOSIS — I10 Essential (primary) hypertension: Secondary | ICD-10-CM | POA: Diagnosis not present

## 2018-12-22 DIAGNOSIS — N184 Chronic kidney disease, stage 4 (severe): Secondary | ICD-10-CM | POA: Diagnosis not present

## 2018-12-28 DIAGNOSIS — N184 Chronic kidney disease, stage 4 (severe): Secondary | ICD-10-CM | POA: Diagnosis not present

## 2018-12-28 DIAGNOSIS — E875 Hyperkalemia: Secondary | ICD-10-CM | POA: Diagnosis not present

## 2019-01-22 DIAGNOSIS — Z23 Encounter for immunization: Secondary | ICD-10-CM | POA: Diagnosis not present

## 2019-01-22 DIAGNOSIS — E538 Deficiency of other specified B group vitamins: Secondary | ICD-10-CM | POA: Diagnosis not present

## 2019-02-23 DIAGNOSIS — E538 Deficiency of other specified B group vitamins: Secondary | ICD-10-CM | POA: Diagnosis not present

## 2019-03-19 ENCOUNTER — Other Ambulatory Visit: Payer: Self-pay

## 2019-03-19 ENCOUNTER — Ambulatory Visit (HOSPITAL_COMMUNITY)
Admission: RE | Admit: 2019-03-19 | Discharge: 2019-03-19 | Disposition: A | Discharge status: Home / Self Care | Payer: Medicare HMO | Source: Ambulatory Visit | Attending: Internal Medicine | Admitting: Internal Medicine

## 2019-03-19 ENCOUNTER — Other Ambulatory Visit: Payer: Self-pay | Admitting: Internal Medicine

## 2019-03-19 DIAGNOSIS — M7989 Other specified soft tissue disorders: Secondary | ICD-10-CM | POA: Diagnosis not present

## 2019-03-19 DIAGNOSIS — M79605 Pain in left leg: Secondary | ICD-10-CM | POA: Diagnosis not present

## 2019-03-19 DIAGNOSIS — M79661 Pain in right lower leg: Secondary | ICD-10-CM | POA: Diagnosis not present

## 2019-03-31 DIAGNOSIS — Z79899 Other long term (current) drug therapy: Secondary | ICD-10-CM | POA: Diagnosis not present

## 2019-03-31 DIAGNOSIS — N184 Chronic kidney disease, stage 4 (severe): Secondary | ICD-10-CM | POA: Diagnosis not present

## 2019-03-31 DIAGNOSIS — E538 Deficiency of other specified B group vitamins: Secondary | ICD-10-CM | POA: Diagnosis not present

## 2019-04-08 DIAGNOSIS — R1319 Other dysphagia: Secondary | ICD-10-CM | POA: Diagnosis not present

## 2019-04-08 DIAGNOSIS — N184 Chronic kidney disease, stage 4 (severe): Secondary | ICD-10-CM | POA: Diagnosis not present

## 2019-04-08 DIAGNOSIS — I10 Essential (primary) hypertension: Secondary | ICD-10-CM | POA: Diagnosis not present

## 2019-05-04 DIAGNOSIS — E538 Deficiency of other specified B group vitamins: Secondary | ICD-10-CM | POA: Diagnosis not present

## 2019-05-12 DIAGNOSIS — I10 Essential (primary) hypertension: Secondary | ICD-10-CM | POA: Diagnosis not present

## 2019-05-12 DIAGNOSIS — R1310 Dysphagia, unspecified: Secondary | ICD-10-CM | POA: Diagnosis not present

## 2019-06-08 DIAGNOSIS — E538 Deficiency of other specified B group vitamins: Secondary | ICD-10-CM | POA: Diagnosis not present

## 2019-07-06 ENCOUNTER — Encounter: Payer: Self-pay | Admitting: Orthopaedic Surgery

## 2019-07-06 ENCOUNTER — Ambulatory Visit: Payer: Medicare HMO

## 2019-07-06 ENCOUNTER — Other Ambulatory Visit: Payer: Self-pay

## 2019-07-06 ENCOUNTER — Ambulatory Visit: Payer: Medicare HMO | Admitting: Orthopaedic Surgery

## 2019-07-06 VITALS — BP 150/83 | HR 80 | Temp 98.4°F | Wt 100.0 lb

## 2019-07-06 DIAGNOSIS — M25561 Pain in right knee: Secondary | ICD-10-CM | POA: Diagnosis not present

## 2019-07-06 DIAGNOSIS — G8929 Other chronic pain: Secondary | ICD-10-CM

## 2019-07-06 DIAGNOSIS — M25551 Pain in right hip: Secondary | ICD-10-CM | POA: Diagnosis not present

## 2019-07-06 NOTE — Progress Notes (Signed)
Subjective:    Patient ID: [name], female    DOB: 1918/08/31, 86 y.o.   MRN: [id number]  HPI She has several month history of right knee pain with no trauma no swelling.  She has some right hip pain as well.  She has no redness, no numbness.  The pain is worse with the cold weather.  She is very active and is accompanied by her son.  She does not like medicine.  I have reviewed Dr. Ria Comment notes.   Review of Systems  Constitutional: Positive for activity change.  Musculoskeletal: Positive for arthralgias, gait problem and joint swelling.  All other systems reviewed and are negative.  For Review of Systems, all other systems reviewed and are negative.  The following is a summary of the past history medically, past history surgically, known current medicines, social history and family history.  This information is gathered electronically by the computer from prior information and documentation.  I review this each visit and have found including this information at this point in the chart is beneficial and informative.   Past Medical History:  Diagnosis Date  . DDD (degenerative disc disease), lumbar   . DVT (deep venous thrombosis) ([place])   . Hip fracture ([place])    left  . Hypertension   . Osteoporosis   . PE (pulmonary embolism)   . SBO (small bowel obstruction) ([place]) 2007  . Sigmoid diverticulosis   . Uterine cancer ([place]) 1993   s/p radiation therapy and hysterectomy    Past Surgical History:  Procedure Laterality Date  . ABDOMINAL ADHESION SURGERY  2007   Dr. Margot Chimes  . ABDOMINAL HYSTERECTOMY  1993  . APPENDECTOMY  2007  . BOWEL RESECTION  2007   terminal ileum (Dr. Margot Chimes)  . CHOLECYSTECTOMY  2006  . HIP SURGERY Left 2007   Total hip replacement    Current Outpatient Medications on File Prior to Visit  Medication Sig Dispense Refill  . Calcium Carbonate-Vitamin D (CALCIUM + D PO) Take 1 tablet by mouth daily.    . cyanocobalamin (,VITAMIN B-12,) 1000 MCG/ML  injection Inject 1,000 mcg into the muscle every 30 (thirty) days.    Marland Kitchen diltiazem (DILACOR XR) 180 MG 24 hr capsule Take 180 mg by mouth daily.    Marland Kitchen ibuprofen (ADVIL,MOTRIN) 600 MG tablet Take 1 tablet (600 mg total) by mouth 3 (three) times daily. 90 tablet 2  . loperamide (IMODIUM) 2 MG capsule Take 2 mg by mouth daily.    Marland Kitchen losartan-hydrochlorothiazide (HYZAAR) 100-25 MG per tablet Take 1 tablet by mouth daily.    Marland Kitchen warfarin (COUMADIN) 3 MG tablet Take 3-4.5 mg by mouth daily. Take 1.5 tablets PO Tuesday,  Wednesday, Friday and Saturday.  Take 1 tablet Monday and Thursday.     No current facility-administered medications on file prior to visit.    Social History   Socioeconomic History  . Marital status: Widowed    Spouse name: Not on file  . Number of children: Not on file  . Years of education: Not on file  . Highest education level: Not on file  Occupational History  . Not on file  Tobacco Use  . Smoking status: Never Smoker  . Smokeless tobacco: Never Used  Substance and Sexual Activity  . Alcohol use: No  . Drug use: No  . Sexual activity: Not on file  Other Topics Concern  . Not on file  Social History Narrative  . Not on file   Social Determinants  of Health   Financial Resource Strain:   . Difficulty of Paying Living Expenses: Not on file  Food Insecurity:   . Worried About Charity fundraiser in the Last Year: Not on file  . Ran Out of Food in the Last Year: Not on file  Transportation Needs:   . Lack of Transportation (Medical): Not on file  . Lack of Transportation (Non-Medical): Not on file  Physical Activity:   . Days of Exercise per Week: Not on file  . Minutes of Exercise per Session: Not on file  Stress:   . Feeling of Stress : Not on file  Social Connections:   . Frequency of Communication with Friends and Family: Not on file  . Frequency of Social Gatherings with Friends and Family: Not on file  . Attends Religious Services: Not on file  . Active  Member of Clubs or Organizations: Not on file  . Attends Archivist Meetings: Not on file  . Marital Status: Not on file  Intimate Partner Violence:   . Fear of Current or Ex-Partner: Not on file  . Emotionally Abused: Not on file  . Physically Abused: Not on file  . Sexually Abused: Not on file    History reviewed. No pertinent family history.  BP (!) 150/83   Pulse 80   Temp 98.4 F (36.9 C)   Wt 100 lb (45.4 kg)   BMI 17.71 kg/m   Body mass index is 17.71 kg/m.      Objective:   Physical Exam Vitals and nursing note reviewed.  Constitutional:      Appearance: She is well-developed.  HENT:     Head: Normocephalic and atraumatic.  Eyes:     Conjunctiva/sclera: Conjunctivae normal.     Pupils: Pupils are equal, round, and reactive to light.  Cardiovascular:     Rate and Rhythm: Normal rate and regular rhythm.  Pulmonary:     Effort: Pulmonary effort is normal.  Abdominal:     Palpations: Abdomen is soft.  Musculoskeletal:     Cervical back: Normal range of motion and neck supple.       Legs:  Skin:    General: Skin is warm and dry.  Neurological:     Mental Status: She is alert and oriented to person, place, and time.     Cranial Nerves: No cranial nerve deficit.     Motor: No abnormal muscle tone.     Coordination: Coordination normal.     Deep Tendon Reflexes: Reflexes are normal and symmetric. Reflexes normal.  Psychiatric:        Behavior: Behavior normal.        Thought Content: Thought content normal.        Judgment: Judgment normal.    X-rays were done of the right hip and knee, reported separately.       Assessment & Plan:   Encounter Diagnoses  Name Primary?  . Chronic pain of right knee Yes  . Pain in right hip    PROCEDURE NOTE:  The patient requests injections of the right knee , verbal consent was obtained.  The right knee was prepped appropriately after time out was performed.   Sterile technique was observed and  injection of 1 cc of Depo-Medrol 40 mg with several cc's of plain xylocaine. Anesthesia was provided by ethyl chloride and a 20-gauge needle was used to inject the knee area. The injection was tolerated well.  A band aid dressing was applied.  The  patient was advised to apply ice later today and tomorrow to the injection sight as needed.  I have explained the injection should help but will not "cure" her arthritis.  Return in three to four weeks.  Call if any problem.  Precautions discussed.   Electronically Signed Sanjuana Kava, MD 3/9/20212:57 PM

## 2019-07-22 DIAGNOSIS — X32XXXD Exposure to sunlight, subsequent encounter: Secondary | ICD-10-CM | POA: Diagnosis not present

## 2019-07-22 DIAGNOSIS — L570 Actinic keratosis: Secondary | ICD-10-CM | POA: Diagnosis not present

## 2019-08-03 ENCOUNTER — Other Ambulatory Visit: Payer: Self-pay

## 2019-08-03 ENCOUNTER — Ambulatory Visit (INDEPENDENT_AMBULATORY_CARE_PROVIDER_SITE_OTHER): Payer: Medicare HMO | Admitting: Orthopaedic Surgery

## 2019-08-03 ENCOUNTER — Encounter: Payer: Self-pay | Admitting: Orthopaedic Surgery

## 2019-08-03 VITALS — BP 161/56 | HR 77 | Temp 98.3°F | Ht 62.0 in | Wt 100.0 lb

## 2019-08-03 DIAGNOSIS — G8929 Other chronic pain: Secondary | ICD-10-CM

## 2019-08-03 DIAGNOSIS — M7061 Trochanteric bursitis, right hip: Secondary | ICD-10-CM | POA: Diagnosis not present

## 2019-08-03 DIAGNOSIS — N184 Chronic kidney disease, stage 4 (severe): Secondary | ICD-10-CM | POA: Diagnosis not present

## 2019-08-03 DIAGNOSIS — M25561 Pain in right knee: Secondary | ICD-10-CM

## 2019-08-03 DIAGNOSIS — I10 Essential (primary) hypertension: Secondary | ICD-10-CM | POA: Diagnosis not present

## 2019-08-03 DIAGNOSIS — Z79899 Other long term (current) drug therapy: Secondary | ICD-10-CM | POA: Diagnosis not present

## 2019-08-03 DIAGNOSIS — M25551 Pain in right hip: Secondary | ICD-10-CM

## 2019-08-03 NOTE — Progress Notes (Signed)
Patient [name], female DOB:[birth year], 86 y.o. CZY:[id number]  Chief Complaint  Patient presents with  . Knee Pain    R/when walking it hurts/sitting doesn't bother it    HPI  [name] is a 86 y.o. female who had right knee pain.  That is better but her right hip is tender over the trochanteric area.  She is very thin.  She has no redness or swelling here.  The pain goes down the lateral side of the right thigh.    Body mass index is 18.29 kg/m.  ROS  Review of Systems  Constitutional: Positive for activity change.  Musculoskeletal: Positive for arthralgias, gait problem and joint swelling.  All other systems reviewed and are negative.   All other systems reviewed and are negative.  The following is a summary of the past history medically, past history surgically, known current medicines, social history and family history.  This information is gathered electronically by the computer from prior information and documentation.  I review this each visit and have found including this information at this point in the chart is beneficial and informative.    Past Medical History:  Diagnosis Date  . DDD (degenerative disc disease), lumbar   . DVT (deep venous thrombosis) ([place])   . Hip fracture ([place])    left  . Hypertension   . Osteoporosis   . PE (pulmonary embolism)   . SBO (small bowel obstruction) ([place]) 2007  . Sigmoid diverticulosis   . Uterine cancer ([place]) 1993   s/p radiation therapy and hysterectomy    Past Surgical History:  Procedure Laterality Date  . ABDOMINAL ADHESION SURGERY  2007   Dr. Margot Chimes  . ABDOMINAL HYSTERECTOMY  1993  . APPENDECTOMY  2007  . BOWEL RESECTION  2007   terminal ileum (Dr. Margot Chimes)  . CHOLECYSTECTOMY  2006  . HIP SURGERY Left 2007   Total hip replacement    History reviewed. No pertinent family history.  Social History Social History   Tobacco Use  . Smoking status: Never Smoker  . Smokeless tobacco: Never Used   Substance Use Topics  . Alcohol use: No  . Drug use: No    No Known Allergies  Current Outpatient Medications  Medication Sig Dispense Refill  . Calcium Carbonate-Vitamin D (CALCIUM + D PO) Take 1 tablet by mouth daily.    . cyanocobalamin (,VITAMIN B-12,) 1000 MCG/ML injection Inject 1,000 mcg into the muscle every 30 (thirty) days.    Marland Kitchen diltiazem (DILACOR XR) 180 MG 24 hr capsule Take 180 mg by mouth daily.    Marland Kitchen ibuprofen (ADVIL,MOTRIN) 600 MG tablet Take 1 tablet (600 mg total) by mouth 3 (three) times daily. 90 tablet 2  . loperamide (IMODIUM) 2 MG capsule Take 2 mg by mouth daily.    Marland Kitchen losartan-hydrochlorothiazide (HYZAAR) 100-25 MG per tablet Take 1 tablet by mouth daily.    Marland Kitchen warfarin (COUMADIN) 3 MG tablet Take 3-4.5 mg by mouth daily. Take 1.5 tablets PO Tuesday,  Wednesday, Friday and Saturday.  Take 1 tablet Monday and Thursday.     No current facility-administered medications for this visit.     Physical Exam  Blood pressure (!) 161/56, pulse 77, temperature 98.3 F (36.8 C), height 5\' 2"  (1.575 m), weight 100 lb (45.4 kg).  Constitutional: overall normal hygiene, normal nutrition, well developed, normal grooming, normal body habitus. Assistive device:none  Musculoskeletal: gait and station Limp limp right, muscle tone and strength are normal, no tremors or atrophy is present.  Marland Kitchen  Neurological: coordination overall normal.  Deep tendon reflex/nerve stretch intact.  Sensation normal.  Cranial nerves II-XII intact.   Skin:   Normal overall no scars, lesions, ulcers or rashes. No psoriasis.  Psychiatric: Alert and oriented x 3.  Recent memory intact, remote memory unclear.  Normal mood and affect. Well groomed.  Good eye contact.  Cardiovascular: overall no swelling, no varicosities, no edema bilaterally, normal temperatures of the legs and arms, no clubbing, cyanosis and good capillary refill.  Lymphatic: palpation is normal.  Right knee has good motion and no  pain today.  She is very tender over the right trochanteric area of the hip. ROM is good.  All other systems reviewed and are negative   The patient has been educated about the nature of the problem(s) and counseled on treatment options.  The patient appeared to understand what I have discussed and is in agreement with it.  Encounter Diagnoses  Name Primary?  . Pain in right hip Yes  . Trochanteric bursitis, right hip   . Chronic pain of right knee     PROCEDURE NOTE:  The patient request injection, verbal consent was obtained.  The right trochanteric area of the hip was prepped appropriately after time out was performed.   Sterile technique was observed and injection of 1 cc of Depo-Medrol 40 mg with several cc's of plain xylocaine. Anesthesia was provided by ethyl chloride and a 20-gauge needle was used to inject the hip area. The injection was tolerated well.  A band aid dressing was applied.  The patient was advised to apply ice later today and tomorrow to the injection sight as needed.  PLAN Call if any problems.  Precautions discussed.  Continue current medications.   Return to clinic 1 month   Electronically Signed Sanjuana Kava, MD 4/6/20212:54 PM

## 2019-08-10 DIAGNOSIS — E875 Hyperkalemia: Secondary | ICD-10-CM | POA: Diagnosis not present

## 2019-08-10 DIAGNOSIS — I129 Hypertensive chronic kidney disease with stage 1 through stage 4 chronic kidney disease, or unspecified chronic kidney disease: Secondary | ICD-10-CM | POA: Diagnosis not present

## 2019-08-10 DIAGNOSIS — N184 Chronic kidney disease, stage 4 (severe): Secondary | ICD-10-CM | POA: Diagnosis not present

## 2019-08-17 DIAGNOSIS — E538 Deficiency of other specified B group vitamins: Secondary | ICD-10-CM | POA: Diagnosis not present

## 2019-08-31 ENCOUNTER — Ambulatory Visit (INDEPENDENT_AMBULATORY_CARE_PROVIDER_SITE_OTHER): Payer: Medicare HMO | Admitting: Orthopaedic Surgery

## 2019-08-31 ENCOUNTER — Other Ambulatory Visit: Payer: Self-pay

## 2019-08-31 ENCOUNTER — Encounter: Payer: Self-pay | Admitting: Orthopaedic Surgery

## 2019-08-31 VITALS — BP 119/52 | HR 81 | Ht 62.0 in | Wt 100.0 lb

## 2019-08-31 DIAGNOSIS — M25551 Pain in right hip: Secondary | ICD-10-CM

## 2019-08-31 DIAGNOSIS — Z7901 Long term (current) use of anticoagulants: Secondary | ICD-10-CM

## 2019-08-31 DIAGNOSIS — M7061 Trochanteric bursitis, right hip: Secondary | ICD-10-CM | POA: Diagnosis not present

## 2019-08-31 NOTE — Progress Notes (Signed)
Patient [name], female DOB:1919/04/07, 86 y.o. [id number]  Chief Complaint  Patient presents with  . Hip Pain    right / injection did not help much     HPI  [name] is a 86 y.o. female who has pain of the right hip and trochanteric area.  The injection last time only helped a little.  She is on Coumadin and cannot take NSAIDs. She has no new trauma.  She has good and bad days.  I have told her to use Aspercreme, BioFreeze or Voltaren Gel as well as heat.  I will see her in one month.   Body mass index is 18.29 kg/m.  ROS  Review of Systems  Constitutional: Positive for activity change.  Musculoskeletal: Positive for arthralgias, gait problem and joint swelling.  All other systems reviewed and are negative.   All other systems reviewed and are negative.  The following is a summary of the past history medically, past history surgically, known current medicines, social history and family history.  This information is gathered electronically by the computer from prior information and documentation.  I review this each visit and have found including this information at this point in the chart is beneficial and informative.    Past Medical History:  Diagnosis Date  . DDD (degenerative disc disease), lumbar   . DVT (deep venous thrombosis) ([place])   . Hip fracture ([place])    left  . Hypertension   . Osteoporosis   . PE (pulmonary embolism)   . SBO (small bowel obstruction) ([place]) 2007  . Sigmoid diverticulosis   . Uterine cancer ([place]) 1993   s/p radiation therapy and hysterectomy    Past Surgical History:  Procedure Laterality Date  . ABDOMINAL ADHESION SURGERY  2007   Dr. Margot Chimes  . ABDOMINAL HYSTERECTOMY  1993  . APPENDECTOMY  2007  . BOWEL RESECTION  2007   terminal ileum (Dr. Margot Chimes)  . CHOLECYSTECTOMY  2006  . HIP SURGERY Left 2007   Total hip replacement    History reviewed. No pertinent family history.  Social History Social History    Tobacco Use  . Smoking status: Never Smoker  . Smokeless tobacco: Never Used  Substance Use Topics  . Alcohol use: No  . Drug use: No    No Known Allergies  Current Outpatient Medications  Medication Sig Dispense Refill  . Calcium Carbonate-Vitamin D (CALCIUM + D PO) Take 1 tablet by mouth daily.    . cyanocobalamin (,VITAMIN B-12,) 1000 MCG/ML injection Inject 1,000 mcg into the muscle every 30 (thirty) days.    Marland Kitchen diltiazem (DILACOR XR) 180 MG 24 hr capsule Take 180 mg by mouth daily.    Marland Kitchen ibuprofen (ADVIL,MOTRIN) 600 MG tablet Take 1 tablet (600 mg total) by mouth 3 (three) times daily. 90 tablet 2  . loperamide (IMODIUM) 2 MG capsule Take 2 mg by mouth daily.    Marland Kitchen losartan-hydrochlorothiazide (HYZAAR) 100-25 MG per tablet Take 1 tablet by mouth daily.    Marland Kitchen warfarin (COUMADIN) 3 MG tablet Take 3-4.5 mg by mouth daily. Take 1.5 tablets PO Tuesday,  Wednesday, Friday and Saturday.  Take 1 tablet Monday and Thursday.     No current facility-administered medications for this visit.     Physical Exam  Blood pressure (!) 119/52, pulse 81, height 5\' 2"  (1.575 m), weight 100 lb (45.4 kg).  Constitutional: overall normal hygiene, normal nutrition, well developed, normal grooming, normal body habitus. Assistive device:wheelchair  Musculoskeletal: gait and station  Limp she prefers not to stand, muscle tone and strength are normal, no tremors or atrophy is present.  .  Neurological: coordination overall normal.  Deep tendon reflex/nerve stretch intact.  Sensation normal.  Cranial nerves II-XII intact.   Skin:   Normal overall no scars, lesions, ulcers or rashes. No psoriasis.  Psychiatric: Alert and oriented x 3.  Recent memory intact, remote memory unclear.  Normal mood and affect. Well groomed.  Good eye contact.  Cardiovascular: overall no swelling, no varicosities, no edema bilaterally, normal temperatures of the legs and arms, no clubbing, cyanosis and good capillary  refill.  Lymphatic: palpation is normal.  Right hip is tender over the trochanteric area but not as much as before.  ROM is good  NV intact.  She is very hard of hearing.  All other systems reviewed and are negative   The patient has been educated about the nature of the problem(s) and counseled on treatment options.  The patient appeared to understand what I have discussed and is in agreement with it.  Encounter Diagnoses  Name Primary?  . Trochanteric bursitis, right hip Yes  . Pain in right hip   . Anticoagulated on Coumadin     PLAN Call if any problems.  Precautions discussed.  Continue current medications.   Return to clinic 1 month   Electronically Signed Sanjuana Kava, MD 5/4/202111:21 AM

## 2019-09-02 DIAGNOSIS — X32XXXD Exposure to sunlight, subsequent encounter: Secondary | ICD-10-CM | POA: Diagnosis not present

## 2019-09-02 DIAGNOSIS — L570 Actinic keratosis: Secondary | ICD-10-CM | POA: Diagnosis not present

## 2019-09-21 DIAGNOSIS — E538 Deficiency of other specified B group vitamins: Secondary | ICD-10-CM | POA: Diagnosis not present

## 2019-09-28 ENCOUNTER — Encounter: Payer: Self-pay | Admitting: Orthopaedic Surgery

## 2019-09-28 ENCOUNTER — Other Ambulatory Visit: Payer: Self-pay

## 2019-09-28 ENCOUNTER — Ambulatory Visit (INDEPENDENT_AMBULATORY_CARE_PROVIDER_SITE_OTHER): Payer: Medicare HMO | Admitting: Orthopaedic Surgery

## 2019-09-28 VITALS — Ht 62.0 in | Wt 100.0 lb

## 2019-09-28 DIAGNOSIS — M7061 Trochanteric bursitis, right hip: Secondary | ICD-10-CM

## 2019-09-28 DIAGNOSIS — Z7901 Long term (current) use of anticoagulants: Secondary | ICD-10-CM

## 2019-09-28 NOTE — Progress Notes (Signed)
Patient [name], female DOB:1918-07-26, 86 y.o. QIH:[id number]  Chief Complaint  Patient presents with  . Hip Pain    Rt hip pain     HPI  [name] is a 86 y.o. female who has chronic right hip pain trochanteric bursitis.  She is on Coumadin and cannot take NSAIDs.  The injection did not help much.  She is using Aspercreme which helps some.  I have recommended she use heat before using the Aspercreme rub.  I am limited in what I can do to treat her.     Body mass index is 18.29 kg/m.  ROS  Review of Systems  Constitutional: Positive for activity change.  Musculoskeletal: Positive for arthralgias, gait problem and joint swelling.  All other systems reviewed and are negative.   All other systems reviewed and are negative.  The following is a summary of the past history medically, past history surgically, known current medicines, social history and family history.  This information is gathered electronically by the computer from prior information and documentation.  I review this each visit and have found including this information at this point in the chart is beneficial and informative.    Past Medical History:  Diagnosis Date  . DDD (degenerative disc disease), lumbar   . DVT (deep venous thrombosis) ([place])   . Hip fracture ([place])    left  . Hypertension   . Osteoporosis   . PE (pulmonary embolism)   . SBO (small bowel obstruction) ([place]) 2007  . Sigmoid diverticulosis   . Uterine cancer ([place]) 1993   s/p radiation therapy and hysterectomy    Past Surgical History:  Procedure Laterality Date  . ABDOMINAL ADHESION SURGERY  2007   Dr. Margot Chimes  . ABDOMINAL HYSTERECTOMY  1993  . APPENDECTOMY  2007  . BOWEL RESECTION  2007   terminal ileum (Dr. Margot Chimes)  . CHOLECYSTECTOMY  2006  . HIP SURGERY Left 2007   Total hip replacement    History reviewed. No pertinent family history.  Social History Social History   Tobacco Use  . Smoking status: Never Smoker   . Smokeless tobacco: Never Used  Substance Use Topics  . Alcohol use: No  . Drug use: No    No Known Allergies  Current Outpatient Medications  Medication Sig Dispense Refill  . Calcium Carbonate-Vitamin D (CALCIUM + D PO) Take 1 tablet by mouth daily.    . cyanocobalamin (,VITAMIN B-12,) 1000 MCG/ML injection Inject 1,000 mcg into the muscle every 30 (thirty) days.    Marland Kitchen diltiazem (DILACOR XR) 180 MG 24 hr capsule Take 180 mg by mouth daily.    Marland Kitchen ibuprofen (ADVIL,MOTRIN) 600 MG tablet Take 1 tablet (600 mg total) by mouth 3 (three) times daily. 90 tablet 2  . loperamide (IMODIUM) 2 MG capsule Take 2 mg by mouth daily.    Marland Kitchen losartan-hydrochlorothiazide (HYZAAR) 100-25 MG per tablet Take 1 tablet by mouth daily.    Marland Kitchen warfarin (COUMADIN) 3 MG tablet Take 3-4.5 mg by mouth daily. Take 1.5 tablets PO Tuesday,  Wednesday, Friday and Saturday.  Take 1 tablet Monday and Thursday.     No current facility-administered medications for this visit.     Physical Exam  Height 5\' 2"  (1.575 m), weight 100 lb (45.4 kg).  Constitutional: overall normal hygiene, normal nutrition, well developed, normal grooming, normal body habitus. Assistive device:wheelchair  Musculoskeletal: gait and station Limp prefers not to stand, muscle tone and strength are normal, no tremors or atrophy is  present.  .  Neurological: coordination overall normal.  Deep tendon reflex/nerve stretch intact.  Sensation normal.  Cranial nerves II-XII intact.   Skin:   Normal overall no scars, lesions, ulcers or rashes. No psoriasis.  Psychiatric: Alert and oriented x 3.  Recent memory intact, remote memory unclear.  Normal mood and affect. Well groomed.  Good eye contact.  Cardiovascular: overall no swelling, no varicosities, no edema bilaterally, normal temperatures of the legs and arms, no clubbing, cyanosis and good capillary refill.  Lymphatic: palpation is normal.  ROM right hip is full.  NV intact.  All other systems  reviewed and are negative   The patient has been educated about the nature of the problem(s) and counseled on treatment options.  The patient appeared to understand what I have discussed and is in agreement with it.  Encounter Diagnoses  Name Primary?  . Trochanteric bursitis, right hip Yes  . Anticoagulated on Coumadin     PLAN Call if any problems.  Precautions discussed.  Continue current medications.   Return to clinic prn   Electronically [place], MD 6/1/20212:05 PM

## 2019-10-26 DIAGNOSIS — E538 Deficiency of other specified B group vitamins: Secondary | ICD-10-CM | POA: Diagnosis not present

## 2019-11-03 DIAGNOSIS — N184 Chronic kidney disease, stage 4 (severe): Secondary | ICD-10-CM | POA: Diagnosis not present

## 2019-11-03 DIAGNOSIS — Z79899 Other long term (current) drug therapy: Secondary | ICD-10-CM | POA: Diagnosis not present

## 2019-11-03 DIAGNOSIS — I10 Essential (primary) hypertension: Secondary | ICD-10-CM | POA: Diagnosis not present

## 2019-11-03 DIAGNOSIS — E875 Hyperkalemia: Secondary | ICD-10-CM | POA: Diagnosis not present

## 2019-11-10 DIAGNOSIS — N184 Chronic kidney disease, stage 4 (severe): Secondary | ICD-10-CM | POA: Diagnosis not present

## 2019-11-10 DIAGNOSIS — I129 Hypertensive chronic kidney disease with stage 1 through stage 4 chronic kidney disease, or unspecified chronic kidney disease: Secondary | ICD-10-CM | POA: Diagnosis not present

## 2019-11-30 DIAGNOSIS — E538 Deficiency of other specified B group vitamins: Secondary | ICD-10-CM | POA: Diagnosis not present

## 2019-12-31 DIAGNOSIS — H6120 Impacted cerumen, unspecified ear: Secondary | ICD-10-CM | POA: Diagnosis not present

## 2020-01-04 DIAGNOSIS — D519 Vitamin B12 deficiency anemia, unspecified: Secondary | ICD-10-CM | POA: Diagnosis not present

## 2020-01-13 ENCOUNTER — Ambulatory Visit: Payer: Medicare HMO

## 2020-01-20 ENCOUNTER — Ambulatory Visit: Payer: Medicare HMO | Attending: Internal Medicine

## 2020-01-20 DIAGNOSIS — Z23 Encounter for immunization: Secondary | ICD-10-CM

## 2020-01-20 NOTE — Progress Notes (Signed)
   Covid-19 Vaccination Clinic  Name:  AASIYA CREASEY    MRN: [id number] DOB: June 29, 1918  01/20/2020  Ms. Flud was observed post Covid-19 immunization for 15 minutes without incident. She was provided with Vaccine Information Sheet and instruction to access the V-Safe system.   Ms. Parks was instructed to call 911 with any severe reactions post vaccine: Marland Kitchen Difficulty breathing  . Swelling of face and throat  . A fast heartbeat  . A bad rash all over body  . Dizziness and weakness

## 2020-02-01 DIAGNOSIS — I10 Essential (primary) hypertension: Secondary | ICD-10-CM | POA: Diagnosis not present

## 2020-02-01 DIAGNOSIS — N184 Chronic kidney disease, stage 4 (severe): Secondary | ICD-10-CM | POA: Diagnosis not present

## 2020-02-01 DIAGNOSIS — Z79899 Other long term (current) drug therapy: Secondary | ICD-10-CM | POA: Diagnosis not present

## 2020-02-10 DIAGNOSIS — N184 Chronic kidney disease, stage 4 (severe): Secondary | ICD-10-CM | POA: Diagnosis not present

## 2020-02-10 DIAGNOSIS — I10 Essential (primary) hypertension: Secondary | ICD-10-CM | POA: Diagnosis not present

## 2020-02-14 DIAGNOSIS — D519 Vitamin B12 deficiency anemia, unspecified: Secondary | ICD-10-CM | POA: Diagnosis not present

## 2020-02-14 DIAGNOSIS — Z23 Encounter for immunization: Secondary | ICD-10-CM | POA: Diagnosis not present

## 2020-02-28 DIAGNOSIS — N185 Chronic kidney disease, stage 5: Secondary | ICD-10-CM | POA: Diagnosis not present

## 2020-02-28 DIAGNOSIS — R269 Unspecified abnormalities of gait and mobility: Secondary | ICD-10-CM | POA: Diagnosis not present

## 2020-03-16 DIAGNOSIS — H5201 Hypermetropia, right eye: Secondary | ICD-10-CM | POA: Diagnosis not present

## 2020-03-16 DIAGNOSIS — Z9849 Cataract extraction status, unspecified eye: Secondary | ICD-10-CM | POA: Diagnosis not present

## 2020-03-16 DIAGNOSIS — H5212 Myopia, left eye: Secondary | ICD-10-CM | POA: Diagnosis not present

## 2020-03-16 DIAGNOSIS — Z961 Presence of intraocular lens: Secondary | ICD-10-CM | POA: Diagnosis not present

## 2020-03-16 DIAGNOSIS — H52223 Regular astigmatism, bilateral: Secondary | ICD-10-CM | POA: Diagnosis not present

## 2020-03-16 DIAGNOSIS — H524 Presbyopia: Secondary | ICD-10-CM | POA: Diagnosis not present

## 2020-03-20 DIAGNOSIS — D519 Vitamin B12 deficiency anemia, unspecified: Secondary | ICD-10-CM | POA: Diagnosis not present

## 2020-04-17 DIAGNOSIS — D519 Vitamin B12 deficiency anemia, unspecified: Secondary | ICD-10-CM | POA: Diagnosis not present

## 2020-04-26 DIAGNOSIS — I10 Essential (primary) hypertension: Secondary | ICD-10-CM | POA: Diagnosis not present

## 2020-04-26 DIAGNOSIS — R2243 Localized swelling, mass and lump, lower limb, bilateral: Secondary | ICD-10-CM | POA: Diagnosis not present

## 2020-05-18 DIAGNOSIS — N184 Chronic kidney disease, stage 4 (severe): Secondary | ICD-10-CM | POA: Diagnosis not present

## 2020-05-18 DIAGNOSIS — R609 Edema, unspecified: Secondary | ICD-10-CM | POA: Diagnosis not present

## 2020-05-23 DIAGNOSIS — D519 Vitamin B12 deficiency anemia, unspecified: Secondary | ICD-10-CM | POA: Diagnosis not present

## 2020-06-06 DIAGNOSIS — H353133 Nonexudative age-related macular degeneration, bilateral, advanced atrophic without subfoveal involvement: Secondary | ICD-10-CM | POA: Diagnosis not present

## 2020-06-06 DIAGNOSIS — H35033 Hypertensive retinopathy, bilateral: Secondary | ICD-10-CM | POA: Diagnosis not present

## 2020-06-26 DIAGNOSIS — D519 Vitamin B12 deficiency anemia, unspecified: Secondary | ICD-10-CM | POA: Diagnosis not present

## 2020-07-10 DIAGNOSIS — I10 Essential (primary) hypertension: Secondary | ICD-10-CM | POA: Diagnosis not present

## 2020-07-10 DIAGNOSIS — R600 Localized edema: Secondary | ICD-10-CM | POA: Diagnosis not present

## 2020-07-10 DIAGNOSIS — N184 Chronic kidney disease, stage 4 (severe): Secondary | ICD-10-CM | POA: Diagnosis not present

## 2020-07-10 DIAGNOSIS — Z79899 Other long term (current) drug therapy: Secondary | ICD-10-CM | POA: Diagnosis not present

## 2020-07-17 DIAGNOSIS — R197 Diarrhea, unspecified: Secondary | ICD-10-CM | POA: Diagnosis not present

## 2020-07-17 DIAGNOSIS — D649 Anemia, unspecified: Secondary | ICD-10-CM | POA: Diagnosis not present

## 2020-07-17 DIAGNOSIS — N2581 Secondary hyperparathyroidism of renal origin: Secondary | ICD-10-CM | POA: Diagnosis not present

## 2020-07-17 DIAGNOSIS — I10 Essential (primary) hypertension: Secondary | ICD-10-CM | POA: Diagnosis not present

## 2020-07-17 DIAGNOSIS — N184 Chronic kidney disease, stage 4 (severe): Secondary | ICD-10-CM | POA: Diagnosis not present

## 2020-08-01 DIAGNOSIS — D519 Vitamin B12 deficiency anemia, unspecified: Secondary | ICD-10-CM | POA: Diagnosis not present

## 2020-09-05 DIAGNOSIS — D519 Vitamin B12 deficiency anemia, unspecified: Secondary | ICD-10-CM | POA: Diagnosis not present

## 2020-09-23 ENCOUNTER — Other Ambulatory Visit: Payer: Self-pay

## 2020-09-23 ENCOUNTER — Encounter: Payer: Self-pay | Admitting: Emergency Medicine

## 2020-09-23 ENCOUNTER — Ambulatory Visit
Admission: EM | Admit: 2020-09-23 | Discharge: 2020-09-23 | Disposition: A | Discharge status: Home / Self Care | Payer: Medicare HMO

## 2020-09-23 DIAGNOSIS — M25472 Effusion, left ankle: Secondary | ICD-10-CM | POA: Diagnosis not present

## 2020-09-23 NOTE — ED Triage Notes (Signed)
Left ankle swelling no known injury.  States ankle has been swelling ever since Christmas and she has seen her PCP  And told to wear compression hoses.

## 2020-09-23 NOTE — ED Provider Notes (Signed)
[facility]    CSN: [id number] Arrival date & time: 09/23/20  1520      History   Chief Complaint No chief complaint on file.   HPI [name] is a 87 y.o. female.   Reports left ankle swelling for the last few weeks.  Reports that this happened before in November or December 2021.  States that she was told to wear compression hose and has been doing so.  States that she stopped wearing the compression hose and that her left ankle swelling returned.  Denies known injury.  Denies pain.  Denies elevated blood pressures at home, bruising, radiating pain, nausea, vomiting, diarrhea, rash, fever, other symptoms.  ROS per HPI  The history is provided by the patient and a relative.    Past Medical History:  Diagnosis Date  . DDD (degenerative disc disease), lumbar   . DVT (deep venous thrombosis) ([place])   . Hip fracture ([place])    left  . Hypertension   . Osteoporosis   . PE (pulmonary embolism)   . SBO (small bowel obstruction) ([place]) 2007  . Sigmoid diverticulosis   . Uterine cancer ([place]) 1993   s/p radiation therapy and hysterectomy    Patient Active Problem List   Diagnosis Date Noted  . Right rotator cuff tear 01/04/2013  . Pain in joint, shoulder region 01/04/2013  . Muscle weakness (generalized) 01/04/2013  . Abdominal pain 12/14/2012  . Blood in the urine 12/14/2012  . Buzzing in ear 12/14/2012  . Calculus of kidney 12/14/2012  . Essential (primary) hypertension 12/14/2012  . PE (pulmonary embolism) 12/14/2012  . PNA (pneumonia) 12/14/2012    Past Surgical History:  Procedure Laterality Date  . ABDOMINAL ADHESION SURGERY  2007   Dr. Margot Chimes  . ABDOMINAL HYSTERECTOMY  1993  . APPENDECTOMY  2007  . BOWEL RESECTION  2007   terminal ileum (Dr. Margot Chimes)  . CHOLECYSTECTOMY  2006  . HIP SURGERY Left 2007   Total hip replacement    OB History   No obstetric history on file.      Home Medications    Prior to Admission medications    Medication Sig Start Date End Date Taking? Authorizing Provider  Calcium Carbonate-Vitamin D (CALCIUM + D PO) Take 1 tablet by mouth daily.    [provider]  cyanocobalamin (,VITAMIN B-12,) 1000 MCG/ML injection Inject 1,000 mcg into the muscle every 30 (thirty) days.    [provider]  diltiazem (DILACOR XR) 180 MG 24 hr capsule Take 180 mg by mouth daily.    [provider]  ibuprofen (ADVIL,MOTRIN) 600 MG tablet Take 1 tablet (600 mg total) by mouth 3 (three) times daily. 10/04/14   Carole Civil, MD  loperamide (IMODIUM) 2 MG capsule Take 2 mg by mouth daily.    [provider]  losartan-hydrochlorothiazide (HYZAAR) 100-25 MG per tablet Take 1 tablet by mouth daily.    [provider]  warfarin (COUMADIN) 3 MG tablet Take 3-4.5 mg by mouth daily. Take 1.5 tablets PO Tuesday,  Wednesday, Friday and Saturday.  Take 1 tablet Monday and Thursday.    [provider]    Family History History reviewed. No pertinent family history.  Social History Social History   Tobacco Use  . Smoking status: Never Smoker  . Smokeless tobacco: Never Used  Substance Use Topics  . Alcohol use: No  . Drug use: No     Allergies   Patient has no known allergies.  Review of Systems Review of Systems   Physical Exam Triage Vital Signs ED Triage Vitals  Enc Vitals Group     BP 09/23/20 1526 (!) 142/55     Pulse Rate 09/23/20 1526 85     Resp 09/23/20 1526 18     Temp 09/23/20 1526 97.9 F (36.6 C)     Temp Source 09/23/20 1526 Oral     SpO2 09/23/20 1526 94 %     Weight --      Height --      Head Circumference --      Peak Flow --      Pain Score 09/23/20 1528 0     Pain Loc --      Pain Edu? --      Excl. in [place]? --    No data found.  Updated Vital Signs BP (!) 142/55 (BP Location: Left Arm)   Pulse 85   Temp 97.9 F (36.6 C) (Oral)   Resp 18   SpO2 94%   Visual Acuity Right Eye Distance:   Left Eye Distance:    Bilateral Distance:    Right Eye Near:   Left Eye Near:    Bilateral Near:     Physical Exam Vitals and nursing note reviewed.  Constitutional:      General: She is not in acute distress.    Appearance: Normal appearance. She is well-developed. She is not ill-appearing.  HENT:     Head: Normocephalic and atraumatic.     Nose: Nose normal.     Mouth/Throat:     Mouth: Mucous membranes are moist.     Pharynx: Oropharynx is clear.  Eyes:     Extraocular Movements: Extraocular movements intact.     Conjunctiva/sclera: Conjunctivae normal.     Pupils: Pupils are equal, round, and reactive to light.  Cardiovascular:     Rate and Rhythm: Normal rate and regular rhythm.  Pulmonary:     Effort: Pulmonary effort is normal.  Musculoskeletal:        General: Normal range of motion.     Cervical back: Normal range of motion and neck supple.     Left lower leg: Edema present.  Skin:    General: Skin is warm and dry.     Capillary Refill: Capillary refill takes less than 2 seconds.  Neurological:     General: No focal deficit present.     Mental Status: She is alert and oriented to person, place, and time.  Psychiatric:        Mood and Affect: Mood normal.        Behavior: Behavior normal.        Thought Content: Thought content normal.      UC Treatments / Results  Labs (all labs ordered are listed, but only abnormal results are displayed) Labs Reviewed - No data to display  EKG   Radiology No results found.  Procedures Procedures (including critical care time)  Medications Ordered in UC Medications - No data to display  Initial Impression / Assessment and Plan / UC Course  I have reviewed the triage vital signs and the nursing notes.  Pertinent labs & imaging results that were available during my care of the patient were reviewed by me and considered in my medical decision making (see chart for details).    Left ankle swelling  Given the lack of injury,  bruising, pain, no x-ray needed at this time Given that she has her labs drawn every  3 months with primary care, do not feel the need to check labs as she is not having any other symptoms Continue again with compression hose If symptoms are persisting follow-up with Dr. Willey Blade or with this office Follow-up with the ER for high fever, trouble swallowing, trouble breathing, other concerning symptoms  Final Clinical Impressions(s) / UC Diagnoses   Final diagnoses:  Left ankle swelling     Discharge Instructions     Continue with compression hose on that side  Continue to get labs drawn and follow-up with Dr. Willey Blade  Continue to check blood pressures at home, if they are elevated, then I would follow-up before seeing Dr. Willey Blade again  Think this is one of the areas that her body is just going to collect fluid at this point  Follow up with this office or with primary care if symptoms are persisting.  Follow up in the ER for high fever, trouble swallowing, trouble breathing, other concerning symptoms.     ED Prescriptions    None     PDMP not reviewed this encounter.   Faustino Congress, NP 09/23/20 1621

## 2020-09-23 NOTE — Discharge Instructions (Signed)
Continue with compression hose on that side  Continue to get labs drawn and follow-up with Dr. Willey Blade  Continue to check blood pressures at home, if they are elevated, then I would follow-up before seeing Dr. Willey Blade again  Think this is one of the areas that her body is just going to collect fluid at this point  Follow up with this office or with primary care if symptoms are persisting.  Follow up in the ER for high fever, trouble swallowing, trouble breathing, other concerning symptoms.

## 2020-10-17 DIAGNOSIS — D519 Vitamin B12 deficiency anemia, unspecified: Secondary | ICD-10-CM | POA: Diagnosis not present

## 2020-11-13 DIAGNOSIS — D649 Anemia, unspecified: Secondary | ICD-10-CM | POA: Diagnosis not present

## 2020-11-13 DIAGNOSIS — Z79899 Other long term (current) drug therapy: Secondary | ICD-10-CM | POA: Diagnosis not present

## 2020-11-13 DIAGNOSIS — I10 Essential (primary) hypertension: Secondary | ICD-10-CM | POA: Diagnosis not present

## 2020-11-13 DIAGNOSIS — E213 Hyperparathyroidism, unspecified: Secondary | ICD-10-CM | POA: Diagnosis not present

## 2020-11-13 DIAGNOSIS — N184 Chronic kidney disease, stage 4 (severe): Secondary | ICD-10-CM | POA: Diagnosis not present

## 2020-11-17 DIAGNOSIS — D519 Vitamin B12 deficiency anemia, unspecified: Secondary | ICD-10-CM | POA: Diagnosis not present

## 2020-11-22 DIAGNOSIS — I10 Essential (primary) hypertension: Secondary | ICD-10-CM | POA: Diagnosis not present

## 2020-11-22 DIAGNOSIS — N184 Chronic kidney disease, stage 4 (severe): Secondary | ICD-10-CM | POA: Diagnosis not present

## 2020-12-19 DIAGNOSIS — E538 Deficiency of other specified B group vitamins: Secondary | ICD-10-CM | POA: Diagnosis not present

## 2021-01-22 DIAGNOSIS — D519 Vitamin B12 deficiency anemia, unspecified: Secondary | ICD-10-CM | POA: Diagnosis not present

## 2021-02-06 DIAGNOSIS — Z23 Encounter for immunization: Secondary | ICD-10-CM | POA: Diagnosis not present

## 2021-02-26 DIAGNOSIS — D519 Vitamin B12 deficiency anemia, unspecified: Secondary | ICD-10-CM | POA: Diagnosis not present

## 2021-03-20 DIAGNOSIS — Z79899 Other long term (current) drug therapy: Secondary | ICD-10-CM | POA: Diagnosis not present

## 2021-03-20 DIAGNOSIS — N184 Chronic kidney disease, stage 4 (severe): Secondary | ICD-10-CM | POA: Diagnosis not present

## 2021-03-20 DIAGNOSIS — I10 Essential (primary) hypertension: Secondary | ICD-10-CM | POA: Diagnosis not present

## 2021-03-26 DIAGNOSIS — N184 Chronic kidney disease, stage 4 (severe): Secondary | ICD-10-CM | POA: Diagnosis not present

## 2021-03-26 DIAGNOSIS — I10 Essential (primary) hypertension: Secondary | ICD-10-CM | POA: Diagnosis not present

## 2021-04-02 DIAGNOSIS — D519 Vitamin B12 deficiency anemia, unspecified: Secondary | ICD-10-CM | POA: Diagnosis not present

## 2021-06-11 DIAGNOSIS — I110 Hypertensive heart disease with heart failure: Secondary | ICD-10-CM | POA: Diagnosis not present

## 2021-06-11 DIAGNOSIS — R269 Unspecified abnormalities of gait and mobility: Secondary | ICD-10-CM | POA: Diagnosis not present

## 2021-06-11 DIAGNOSIS — G8929 Other chronic pain: Secondary | ICD-10-CM | POA: Diagnosis not present

## 2021-06-11 DIAGNOSIS — M1990 Unspecified osteoarthritis, unspecified site: Secondary | ICD-10-CM | POA: Diagnosis not present

## 2021-06-11 DIAGNOSIS — K5900 Constipation, unspecified: Secondary | ICD-10-CM | POA: Diagnosis not present

## 2021-06-11 DIAGNOSIS — E261 Secondary hyperaldosteronism: Secondary | ICD-10-CM | POA: Diagnosis not present

## 2021-06-11 DIAGNOSIS — R32 Unspecified urinary incontinence: Secondary | ICD-10-CM | POA: Diagnosis not present

## 2021-06-11 DIAGNOSIS — H9193 Unspecified hearing loss, bilateral: Secondary | ICD-10-CM | POA: Diagnosis not present

## 2021-07-27 DIAGNOSIS — R197 Diarrhea, unspecified: Secondary | ICD-10-CM | POA: Diagnosis not present

## 2021-07-27 DIAGNOSIS — N184 Chronic kidney disease, stage 4 (severe): Secondary | ICD-10-CM | POA: Diagnosis not present

## 2021-11-09 DIAGNOSIS — I10 Essential (primary) hypertension: Secondary | ICD-10-CM | POA: Diagnosis not present

## 2021-11-09 DIAGNOSIS — N184 Chronic kidney disease, stage 4 (severe): Secondary | ICD-10-CM | POA: Diagnosis not present

## 2022-02-06 DIAGNOSIS — Z23 Encounter for immunization: Secondary | ICD-10-CM | POA: Diagnosis not present

## 2022-03-13 DIAGNOSIS — Z23 Encounter for immunization: Secondary | ICD-10-CM | POA: Diagnosis not present

## 2022-03-15 DIAGNOSIS — I10 Essential (primary) hypertension: Secondary | ICD-10-CM | POA: Diagnosis not present

## 2022-03-15 DIAGNOSIS — R609 Edema, unspecified: Secondary | ICD-10-CM | POA: Diagnosis not present

## 2022-06-27 DIAGNOSIS — I10 Essential (primary) hypertension: Secondary | ICD-10-CM | POA: Diagnosis not present

## 2022-06-27 DIAGNOSIS — N184 Chronic kidney disease, stage 4 (severe): Secondary | ICD-10-CM | POA: Diagnosis not present

## 2022-07-23 DIAGNOSIS — R609 Edema, unspecified: Secondary | ICD-10-CM | POA: Diagnosis not present

## 2022-07-23 DIAGNOSIS — I10 Essential (primary) hypertension: Secondary | ICD-10-CM | POA: Diagnosis not present

## 2022-09-17 ENCOUNTER — Ambulatory Visit: Payer: Self-pay | Admitting: *Deleted

## 2022-09-17 ENCOUNTER — Encounter: Payer: Self-pay | Admitting: *Deleted

## 2022-09-17 NOTE — Patient Instructions (Addendum)
Visit Information  Thank you for taking time to visit with me today. Please don't hesitate to contact me if I can be of assistance to you.   Following are the goals we discussed today:   Goals Addressed             This Visit's Progress    COMPLETED: Assess Need for Social Work Involvement.   On track    Care Coordination Interventions:  Interventions Today    Flowsheet Row Most Recent Value  Chronic Disease   Chronic disease during today's visit Hypertension (HTN), Other  [Generalized Muscle Weakness]  General Interventions   General Interventions Discussed/Reviewed General Interventions Discussed, Labs, Vaccines, Health Screening, Doctor Visits, General Interventions Reviewed, Annual Eye Exam, Horticulturist, commercial (DME), [employer], Level of Care, Communication with, Annual Foot Exam  [Encouraged]  Labs Hgb A1c annually  [Encouraged]  Vaccines COVID-19, Flu, Pneumonia, RSV, Shingles, Tetanus/Pertussis/Diphtheria  [Encouraged]  Doctor Visits Discussed/Reviewed Doctor Visits Discussed, Specialist, Doctor Visits Reviewed, Annual Wellness Visits, PCP  [Encouraged]  Health Screening Bone Density, Colonoscopy, Mammogram  [Encouraged]  Durable Medical Equipment (DME) BP Cuff, Walker  [Encouraged]  PCP/Specialist Visits Compliance with follow-up visit  [Encouraged]  Communication with PCP/Specialists, RN  [Encouraged]  Level of Care Adult Daycare, Assisted Living, Personal Care Services, Applications  [Encouraged]  Applications Personal Care Services  [Encouraged]  Exercise Interventions   Exercise Discussed/Reviewed Exercise Discussed, Assistive device use and maintanence, Exercise Reviewed, Physical Activity, Weight Managment  [Encouraged]  Physical Activity Discussed/Reviewed Physical Activity Discussed, Home Exercise Program (HEP), Physical Activity Reviewed, Types of exercise  [Encouraged]  Weight Management Weight maintenance  [Encouraged]  Education Interventions    Education Provided Provided Education  Provided Verbal Education On Nutrition, Mental Health/Coping with Illness, When to see the doctor, Foot Care, Eye Care, Labs, Applications, Exercise, Medication, Development worker, community, [employer]  [Encouraged]  Forensic scientist  [Encouraged]  Mental Health Interventions   Mental Health Discussed/Reviewed Other, Crisis, Suicide, Coping Strategies, Substance Abuse, Grief and Loss, Mental Health Reviewed, Depression, Anxiety, Mental Health Discussed  [Encouraged]  Nutrition Interventions   Nutrition Discussed/Reviewed Nutrition Discussed, Adding fruits and vegetables, Increaing proteins, Decreasing fats, Decreasing salt, Supplmental nutrition, Decreasing sugar intake, Portion sizes, Carbohydrate meal planning, Fluid intake, Nutrition Reviewed  [Encouraged]  Pharmacy Interventions   Pharmacy Dicussed/Reviewed Pharmacy Topics Discussed, Pharmacy Topics Reviewed, Medication Adherence, Affording Medications  [Encouraged]  Safety Interventions   Safety Discussed/Reviewed Safety Discussed, Safety Reviewed, Fall Risk, Home Safety  [Encouraged]  Home Safety Assistive Devices, Need for home safety assessment, Refer for community resources  [Encouraged]  Advanced Directive Interventions   Advanced Directives Discussed/Reviewed Advanced Directives Discussed, Advanced Directives Reviewed  [Completed]     Danford Bad, BSW, MSW, LCSW  Licensed Clinical Social Worker  [facility]  Mailing [place] [address], [place], [place] [address] Physical Address[address], [place], [place] [address] Toll Free Main # [phone] Fax # [phone] Cell # [phone] [email or web address]      Please call the care guide team at [phone] if you need to cancel or reschedule your appointment.   If you are experiencing a Mental Health or Behavioral Health Crisis or need someone to talk  to, please call the Suicide and Crisis Lifeline: 988 call the [place] National Suicide Prevention Lifeline: (276)174-9740 or TTY: (240) 155-3374 TTY 5310710540) to talk to a trained counselor call 1-800-273-TALK (toll free, 24 hour hotline) go to [facility] [address], [place] [phone])  call the [facility]: [phone] call 911  Patient verbalizes understanding of instructions and care plan provided today and agrees to view in MyChart. Active MyChart status and patient understanding of how to access instructions and care plan via MyChart confirmed with patient.     No further follow up required.   Danford Bad, BSW, MSW, LCSW  Licensed Restaurant manager, [facility]  Mailing [place] [address], [place], [place] [address] Physical Address[address] [place], [place] [address] Toll Free Main # [phone] Fax # [phone] Cell # [phone] [email or web address]

## 2022-09-17 NOTE — Patient Outreach (Addendum)
Care Coordination   Initial Visit Note   09/17/2022  Name: [name] MRN: [id number] DOB: 02/03/1919  [name] is a 89 y.o. year old female who sees Carylon Perches, MD for primary care. I spoke with Ovidio Kin by phone today.  What matters to the patients health and wellness today?  No interventions identified. Patient denies need for social work involvement at this time.   Goals Addressed             This Visit's Progress    COMPLETED: Assess Need for Social Work Involvement.   On track    Care Coordination Interventions:  Interventions Today    Flowsheet Row Most Recent Value  Chronic Disease   Chronic disease during today's visit Hypertension (HTN), Other  [Generalized Muscle Weakness]  General Interventions   General Interventions Discussed/Reviewed General Interventions Discussed, Labs, Vaccines, Health Screening, Doctor Visits, General Interventions Reviewed, Annual Eye Exam, Horticulturist, commercial (DME), [employer], Level of Care, Communication with, Annual Foot Exam  [Encouraged]  Labs Hgb A1c annually  [Encouraged]  Vaccines COVID-19, Flu, Pneumonia, RSV, Shingles, Tetanus/Pertussis/Diphtheria  [Encouraged]  Doctor Visits Discussed/Reviewed Doctor Visits Discussed, Specialist, Doctor Visits Reviewed, Annual Wellness Visits, PCP  [Encouraged]  Health Screening Bone Density, Colonoscopy, Mammogram  [Encouraged]  Durable Medical Equipment (DME) BP Cuff, Walker  [Encouraged]  PCP/Specialist Visits Compliance with follow-up visit  [Encouraged]  Communication with PCP/Specialists, RN  [Encouraged]  Level of Care Adult Daycare, Assisted Living, Personal Care Services, Applications  [Encouraged]  Applications Personal Care Services  [Encouraged]  Exercise Interventions   Exercise Discussed/Reviewed Exercise Discussed, Assistive device use and maintanence, Exercise Reviewed, Physical Activity, Weight Managment  [Encouraged]  Physical Activity  Discussed/Reviewed Physical Activity Discussed, Home Exercise Program (HEP), Physical Activity Reviewed, Types of exercise  [Encouraged]  Weight Management Weight maintenance  [Encouraged]  Education Interventions   Education Provided Provided Education  Provided Verbal Education On Nutrition, Mental Health/Coping with Illness, When to see the doctor, Foot Care, Eye Care, Labs, Applications, Exercise, Medication, Development worker, community, [employer]  [Encouraged]  Forensic scientist  [Encouraged]  Mental Health Interventions   Mental Health Discussed/Reviewed Other, Crisis, Suicide, Coping Strategies, Substance Abuse, Grief and Loss, Mental Health Reviewed, Depression, Anxiety, Mental Health Discussed  [Encouraged]  Nutrition Interventions   Nutrition Discussed/Reviewed Nutrition Discussed, Adding fruits and vegetables, Increaing proteins, Decreasing fats, Decreasing salt, Supplmental nutrition, Decreasing sugar intake, Portion sizes, Carbohydrate meal planning, Fluid intake, Nutrition Reviewed  [Encouraged]  Pharmacy Interventions   Pharmacy Dicussed/Reviewed Pharmacy Topics Discussed, Pharmacy Topics Reviewed, Medication Adherence, Affording Medications  [Encouraged]  Safety Interventions   Safety Discussed/Reviewed Safety Discussed, Safety Reviewed, Fall Risk, Home Safety  [Encouraged]  Home Safety Assistive Devices, Need for home safety assessment, Refer for community resources  [Encouraged]  Advanced Directive Interventions   Advanced Directives Discussed/Reviewed Advanced Directives Discussed, Advanced Directives Reviewed  [Completed]     Danford Bad, BSW, MSW, LCSW  Licensed Clinical Social Worker  [facility]  Mailing [place] [address], [place], [place] [address] Physical Address[address] [place], [place] [address] Toll Free Main # [phone] Fax # [phone] Cell #  [phone] [email or web address]        SDOH assessments and interventions completed:  Yes.  SDOH Interventions Today    Flowsheet Row Most Recent Value  SDOH Interventions   Food Insecurity Interventions Intervention Not Indicated  Housing Interventions Intervention Not Indicated  Transportation Interventions Intervention Not Indicated, Patient Resources (Friends/Family), Payor  Benefit  Utilities Interventions Intervention Not Indicated  Alcohol Usage Interventions Intervention Not Indicated (Score <7)  Financial Strain Interventions Intervention Not Indicated  Physical Activity Interventions Intervention Not Indicated  Stress Interventions Intervention Not Indicated  Social Connections Interventions Intervention Not Indicated     Care Coordination Interventions:  Yes, provided.   Follow up plan: No further intervention required.   Encounter Outcome:  Pt. Visit Completed.   Danford Bad, BSW, MSW, LCSW  Licensed Restaurant manager, fast food [facility]  Mailing [place] [address] [place], [place] [address] Physical Address[address] [place], [place] [address] Toll Free Main # [phone] Fax # [phone] Cell # [phone] [email or web address]

## 2022-10-14 DIAGNOSIS — L89310 Pressure ulcer of right buttock, unstageable: Secondary | ICD-10-CM | POA: Diagnosis not present

## 2022-10-16 DIAGNOSIS — L89312 Pressure ulcer of right buttock, stage 2: Secondary | ICD-10-CM | POA: Diagnosis not present

## 2022-10-16 DIAGNOSIS — I129 Hypertensive chronic kidney disease with stage 1 through stage 4 chronic kidney disease, or unspecified chronic kidney disease: Secondary | ICD-10-CM | POA: Diagnosis not present

## 2022-10-16 DIAGNOSIS — I2699 Other pulmonary embolism without acute cor pulmonale: Secondary | ICD-10-CM | POA: Diagnosis not present

## 2022-10-16 DIAGNOSIS — Z556 Problems related to health literacy: Secondary | ICD-10-CM | POA: Diagnosis not present

## 2022-10-16 DIAGNOSIS — L89322 Pressure ulcer of left buttock, stage 2: Secondary | ICD-10-CM | POA: Diagnosis not present

## 2022-10-16 DIAGNOSIS — S41111D Laceration without foreign body of right upper arm, subsequent encounter: Secondary | ICD-10-CM | POA: Diagnosis not present

## 2022-10-16 DIAGNOSIS — N184 Chronic kidney disease, stage 4 (severe): Secondary | ICD-10-CM | POA: Diagnosis not present

## 2022-10-16 DIAGNOSIS — M810 Age-related osteoporosis without current pathological fracture: Secondary | ICD-10-CM | POA: Diagnosis not present

## 2022-10-16 DIAGNOSIS — R197 Diarrhea, unspecified: Secondary | ICD-10-CM | POA: Diagnosis not present

## 2022-10-21 ENCOUNTER — Encounter (HOSPITAL_COMMUNITY): Payer: Self-pay

## 2022-10-21 ENCOUNTER — Other Ambulatory Visit: Payer: Self-pay

## 2022-10-21 ENCOUNTER — Inpatient Hospital Stay (HOSPITAL_COMMUNITY)
Admission: EM | Admit: 2022-10-21 | Discharge: 2022-10-25 | DRG: 593 | Disposition: A | Discharge status: Skilled Nursing Facility | Payer: PPO | Attending: Internal Medicine | Admitting: Internal Medicine

## 2022-10-21 ENCOUNTER — Emergency Department (HOSPITAL_COMMUNITY): Payer: PPO

## 2022-10-21 DIAGNOSIS — Z7401 Bed confinement status: Secondary | ICD-10-CM | POA: Diagnosis not present

## 2022-10-21 DIAGNOSIS — Z86711 Personal history of pulmonary embolism: Secondary | ICD-10-CM | POA: Diagnosis not present

## 2022-10-21 DIAGNOSIS — Z9049 Acquired absence of other specified parts of digestive tract: Secondary | ICD-10-CM

## 2022-10-21 DIAGNOSIS — Z96642 Presence of left artificial hip joint: Secondary | ICD-10-CM | POA: Diagnosis present

## 2022-10-21 DIAGNOSIS — Z8542 Personal history of malignant neoplasm of other parts of uterus: Secondary | ICD-10-CM | POA: Diagnosis not present

## 2022-10-21 DIAGNOSIS — Z66 Do not resuscitate: Secondary | ICD-10-CM | POA: Diagnosis present

## 2022-10-21 DIAGNOSIS — Z791 Long term (current) use of non-steroidal anti-inflammatories (NSAID): Secondary | ICD-10-CM

## 2022-10-21 DIAGNOSIS — E872 Acidosis, unspecified: Secondary | ICD-10-CM | POA: Diagnosis present

## 2022-10-21 DIAGNOSIS — R531 Weakness: Principal | ICD-10-CM

## 2022-10-21 DIAGNOSIS — L089 Local infection of the skin and subcutaneous tissue, unspecified: Secondary | ICD-10-CM | POA: Diagnosis present

## 2022-10-21 DIAGNOSIS — L89159 Pressure ulcer of sacral region, unspecified stage: Secondary | ICD-10-CM | POA: Diagnosis not present

## 2022-10-21 DIAGNOSIS — Z7901 Long term (current) use of anticoagulants: Secondary | ICD-10-CM

## 2022-10-21 DIAGNOSIS — Z8719 Personal history of other diseases of the digestive system: Secondary | ICD-10-CM

## 2022-10-21 DIAGNOSIS — L89150 Pressure ulcer of sacral region, unstageable: Principal | ICD-10-CM | POA: Diagnosis present

## 2022-10-21 DIAGNOSIS — I959 Hypotension, unspecified: Secondary | ICD-10-CM | POA: Diagnosis not present

## 2022-10-21 DIAGNOSIS — N184 Chronic kidney disease, stage 4 (severe): Secondary | ICD-10-CM | POA: Diagnosis not present

## 2022-10-21 DIAGNOSIS — J449 Chronic obstructive pulmonary disease, unspecified: Secondary | ICD-10-CM | POA: Diagnosis not present

## 2022-10-21 DIAGNOSIS — A0472 Enterocolitis due to Clostridium difficile, not specified as recurrent: Secondary | ICD-10-CM | POA: Diagnosis not present

## 2022-10-21 DIAGNOSIS — Z79899 Other long term (current) drug therapy: Secondary | ICD-10-CM

## 2022-10-21 DIAGNOSIS — Z923 Personal history of irradiation: Secondary | ICD-10-CM

## 2022-10-21 DIAGNOSIS — E876 Hypokalemia: Secondary | ICD-10-CM | POA: Diagnosis not present

## 2022-10-21 DIAGNOSIS — K838 Other specified diseases of biliary tract: Secondary | ICD-10-CM | POA: Diagnosis not present

## 2022-10-21 DIAGNOSIS — I10 Essential (primary) hypertension: Secondary | ICD-10-CM | POA: Diagnosis present

## 2022-10-21 DIAGNOSIS — K5730 Diverticulosis of large intestine without perforation or abscess without bleeding: Secondary | ICD-10-CM | POA: Diagnosis not present

## 2022-10-21 DIAGNOSIS — I129 Hypertensive chronic kidney disease with stage 1 through stage 4 chronic kidney disease, or unspecified chronic kidney disease: Secondary | ICD-10-CM | POA: Diagnosis present

## 2022-10-21 DIAGNOSIS — Z8249 Family history of ischemic heart disease and other diseases of the circulatory system: Secondary | ICD-10-CM

## 2022-10-21 DIAGNOSIS — K529 Noninfective gastroenteritis and colitis, unspecified: Secondary | ICD-10-CM | POA: Diagnosis not present

## 2022-10-21 DIAGNOSIS — R197 Diarrhea, unspecified: Secondary | ICD-10-CM | POA: Diagnosis not present

## 2022-10-21 DIAGNOSIS — S31000A Unspecified open wound of lower back and pelvis without penetration into retroperitoneum, initial encounter: Secondary | ICD-10-CM | POA: Diagnosis not present

## 2022-10-21 DIAGNOSIS — M5136 Other intervertebral disc degeneration, lumbar region: Secondary | ICD-10-CM | POA: Diagnosis present

## 2022-10-21 DIAGNOSIS — Z90710 Acquired absence of both cervix and uterus: Secondary | ICD-10-CM | POA: Diagnosis not present

## 2022-10-21 DIAGNOSIS — M810 Age-related osteoporosis without current pathological fracture: Secondary | ICD-10-CM | POA: Diagnosis not present

## 2022-10-21 DIAGNOSIS — L8993 Pressure ulcer of unspecified site, stage 3: Secondary | ICD-10-CM | POA: Diagnosis not present

## 2022-10-21 DIAGNOSIS — M6281 Muscle weakness (generalized): Secondary | ICD-10-CM | POA: Diagnosis not present

## 2022-10-21 DIAGNOSIS — I700 Atherosclerosis of aorta: Secondary | ICD-10-CM | POA: Diagnosis not present

## 2022-10-21 DIAGNOSIS — L8990 Pressure ulcer of unspecified site, unspecified stage: Secondary | ICD-10-CM | POA: Diagnosis not present

## 2022-10-21 DIAGNOSIS — L8995 Pressure ulcer of unspecified site, unstageable: Secondary | ICD-10-CM | POA: Diagnosis not present

## 2022-10-21 LAB — COMPREHENSIVE METABOLIC PANEL
ALT: 9 U/L (ref 0–44)
AST: 15 U/L (ref 15–41)
Albumin: 2.9 g/dL — ABNORMAL LOW (ref 3.5–5.0)
Alkaline Phosphatase: 52 U/L (ref 38–126)
Anion gap: 12 (ref 5–15)
BUN: 48 mg/dL — ABNORMAL HIGH (ref 8–23)
CO2: 28 mmol/L (ref 22–32)
Calcium: 9.2 mg/dL (ref 8.9–10.3)
Chloride: 92 mmol/L — ABNORMAL LOW (ref 98–111)
Creatinine, Ser: 2.06 mg/dL — ABNORMAL HIGH (ref 0.44–1.00)
GFR, Estimated: 21 mL/min — ABNORMAL LOW (ref >60)
Glucose, Bld: 108 mg/dL — ABNORMAL HIGH (ref 70–99)
Potassium: 3.6 mmol/L (ref 3.5–5.1)
Sodium: 132 mmol/L — ABNORMAL LOW (ref 135–145)
Total Bilirubin: 0.7 mg/dL (ref 0.3–1.2)
Total Protein: 6.9 g/dL (ref 6.5–8.1)

## 2022-10-21 LAB — CBC WITH DIFFERENTIAL/PLATELET
Abs Immature Granulocytes: 0.08 10*3/uL — ABNORMAL HIGH (ref 0.00–0.07)
Basophils Absolute: 0.1 10*3/uL (ref 0.0–0.1)
Basophils Relative: 1 %
Eosinophils Absolute: 0.1 10*3/uL (ref 0.0–0.5)
Eosinophils Relative: 0 %
HCT: 30 % — ABNORMAL LOW (ref 36.0–46.0)
Hemoglobin: 9.6 g/dL — ABNORMAL LOW (ref 12.0–15.0)
Immature Granulocytes: 1 %
Lymphocytes Relative: 17 %
Lymphs Abs: 2.6 10*3/uL (ref 0.7–4.0)
MCH: 26.1 pg (ref 26.0–34.0)
MCHC: 32 g/dL (ref 30.0–36.0)
MCV: 81.5 fL (ref 80.0–100.0)
Monocytes Absolute: 1.1 10*3/uL — ABNORMAL HIGH (ref 0.1–1.0)
Monocytes Relative: 7 %
Neutro Abs: 11.8 10*3/uL — ABNORMAL HIGH (ref 1.7–7.7)
Neutrophils Relative %: 74 %
Platelets: 532 10*3/uL — ABNORMAL HIGH (ref 150–400)
RBC: 3.68 MIL/uL — ABNORMAL LOW (ref 3.87–5.11)
RDW: 17 % — ABNORMAL HIGH (ref 11.5–15.5)
WBC: 15.6 10*3/uL — ABNORMAL HIGH (ref 4.0–10.5)
nRBC: 0 % (ref 0.0–0.2)

## 2022-10-21 LAB — LACTIC ACID, PLASMA
Lactic Acid, Venous: 2.1 mmol/L (ref 0.5–1.9)
Lactic Acid, Venous: 1.1 mmol/L (ref 0.5–1.9)

## 2022-10-21 LAB — BRAIN NATRIURETIC PEPTIDE: B Natriuretic Peptide: 195 pg/mL — ABNORMAL HIGH (ref 0.0–100.0)

## 2022-10-21 LAB — PROTIME-INR
INR: 1.1 (ref 0.8–1.2)
Prothrombin Time: 14.3 seconds (ref 11.4–15.2)

## 2022-10-21 LAB — TROPONIN I (HIGH SENSITIVITY)
Troponin I (High Sensitivity): 19 ng/L — ABNORMAL HIGH (ref <18)
Troponin I (High Sensitivity): 17 ng/L (ref <18)

## 2022-10-21 MED ORDER — ONDANSETRON HCL 4 MG PO TABS: 4.0000 mg | ORAL_TABLET | Freq: Four times a day (QID) | ORAL | Status: DC | PRN

## 2022-10-21 MED ORDER — POLYETHYLENE GLYCOL 3350 17 G PO PACK: 17.0000 g | PACK | Freq: Every day | ORAL | Status: DC | PRN

## 2022-10-21 MED ORDER — POTASSIUM CHLORIDE 20 MEQ PO PACK
40.0000 meq | PACK | Freq: Once | ORAL | Status: AC
  Administered 2022-10-21: 40 meq via ORAL
  Filled 2022-10-21: qty 2

## 2022-10-21 MED ORDER — ONDANSETRON HCL 4 MG/2ML IJ SOLN: 4.0000 mg | Freq: Four times a day (QID) | INTRAMUSCULAR | Status: DC | PRN

## 2022-10-21 MED ORDER — SODIUM CHLORIDE 0.9 % IV SOLN
2.0000 g | Freq: Once | INTRAVENOUS | Status: AC
  Administered 2022-10-21: 2 g via INTRAVENOUS
  Filled 2022-10-21: qty 12.5

## 2022-10-21 MED ORDER — LACTATED RINGERS IV BOLUS (SEPSIS)
250.0000 mL | Freq: Once | INTRAVENOUS | Status: AC
  Administered 2022-10-21: 250 mL via INTRAVENOUS

## 2022-10-21 MED ORDER — LACTATED RINGERS IV BOLUS
1000.0000 mL | Freq: Once | INTRAVENOUS | Status: AC
  Administered 2022-10-21: 1000 mL via INTRAVENOUS

## 2022-10-21 MED ORDER — METRONIDAZOLE 500 MG/100ML IV SOLN
500.0000 mg | Freq: Once | INTRAVENOUS | Status: AC
  Administered 2022-10-21: 500 mg via INTRAVENOUS
  Filled 2022-10-21: qty 100

## 2022-10-21 MED ORDER — SODIUM CHLORIDE 0.9 % IV SOLN
1.0000 g | INTRAVENOUS | Status: DC
  Administered 2022-10-22 – 2022-10-23 (×2): 1 g via INTRAVENOUS
  Filled 2022-10-21 (×3): qty 10

## 2022-10-21 MED ORDER — MORPHINE SULFATE (PF) 2 MG/ML IV SOLN
1.0000 mg | INTRAVENOUS | Status: DC | PRN
  Administered 2022-10-23: 1 mg via INTRAVENOUS
  Filled 2022-10-21: qty 1

## 2022-10-21 MED ORDER — LACTATED RINGERS IV BOLUS
500.0000 mL | Freq: Once | INTRAVENOUS | Status: AC
  Administered 2022-10-21: 500 mL via INTRAVENOUS

## 2022-10-21 MED ORDER — GADOBUTROL 1 MMOL/ML IV SOLN
5.0000 mL | Freq: Once | INTRAVENOUS | Status: AC | PRN
  Administered 2022-10-21: 5 mL via INTRAVENOUS

## 2022-10-21 MED ORDER — VANCOMYCIN HCL 500 MG/100ML IV SOLN
500.0000 mg | INTRAVENOUS | Status: DC
  Administered 2022-10-23: 500 mg via INTRAVENOUS
  Filled 2022-10-21 (×2): qty 100

## 2022-10-21 MED ORDER — HEPARIN SODIUM (PORCINE) 5000 UNIT/ML IJ SOLN
5000.0000 [IU] | Freq: Three times a day (TID) | INTRAMUSCULAR | Status: DC
  Administered 2022-10-21 – 2022-10-25 (×12): 5000 [IU] via SUBCUTANEOUS
  Filled 2022-10-21 (×12): qty 1

## 2022-10-21 MED ORDER — ACETAMINOPHEN 650 MG RE SUPP: 650.0000 mg | Freq: Four times a day (QID) | RECTAL | Status: DC | PRN

## 2022-10-21 MED ORDER — VANCOMYCIN HCL IN DEXTROSE 1-5 GM/200ML-% IV SOLN
1000.0000 mg | Freq: Once | INTRAVENOUS | Status: AC
  Administered 2022-10-21: 1000 mg via INTRAVENOUS
  Filled 2022-10-21: qty 200

## 2022-10-21 MED ORDER — ACETAMINOPHEN 325 MG PO TABS
650.0000 mg | ORAL_TABLET | Freq: Four times a day (QID) | ORAL | Status: DC | PRN
  Administered 2022-10-23 – 2022-10-24 (×3): 650 mg via ORAL
  Filled 2022-10-21 (×3): qty 2

## 2022-10-21 NOTE — ED Notes (Signed)
Patient transported to MRI 

## 2022-10-21 NOTE — Assessment & Plan Note (Signed)
Cr- 2.06 today, last checked 10 years ago creatinine was 1.8. -1.725L was given,holding off on further fluids

## 2022-10-21 NOTE — ED Notes (Signed)
Pt transported to floor with NT.

## 2022-10-21 NOTE — ED Notes (Signed)
Daughter June states to call her at the time of D/C and she will pick pt up.

## 2022-10-21 NOTE — Assessment & Plan Note (Signed)
Stable. -Per medications at bedside, patient is only on Lasix 20 mg

## 2022-10-21 NOTE — ED Triage Notes (Signed)
Pt bib [facility] c/o weakness and diarrhea for about a week. Diarrhea is normally controlled with medication, but this past week it has not been. Pt is usually able to get up and move around with her walker but has been to weak to for the past week.

## 2022-10-21 NOTE — Progress Notes (Signed)
Pharmacy Antibiotic Note  [name] is a 89 y.o. female admitted on 10/21/2022 with  wound infection .  Pharmacy has been consulted for Vancomycin and cefepime dosing. Chronic sacral wound infection and worsened foot swelling.   Plan: Vancomycin 1000mg  loading dose, then 500 mg IV Q 60 hrs. Goal AUC 400-550. Expected AUC: ~550 SCr used: 2.06( baseline Scr 1.27) Cefepime 2gm IV loading dose, then 1gm IV q24h F/U cxs and clinical progress Monitor V/S, labs and levels as indicated    Height: 5\' 2"  (157.5 cm) Weight: 45.4 kg (100 lb 1.4 oz) IBW/kg (Calculated) : 50.1  Temp (24hrs), Avg:97.8 F (36.6 C), Min:97.8 F (36.6 C), Max:97.8 F (36.6 C)  Recent Labs  Lab 10/21/22 1245  WBC 15.6*  CREATININE 2.06*  LATICACIDVEN 2.1*    Estimated Creatinine Clearance: 9.9 mL/min (A) (by C-G formula based on SCr of 2.06 mg/dL (H)).    No Known Allergies  Antimicrobials this admission: cefepime 6/24 >>  vancomycin 6/24 >>   Microbiology results: 6/24 BCx: pending  MRSA PCR:   Thank you for allowing pharmacy to be a part of this patient's care.  Elder Cyphers, BS Pharm D, BCPS Clinical Pharmacist 10/21/2022 3:24 PM

## 2022-10-21 NOTE — Assessment & Plan Note (Signed)
Unchanged in severity, monitor.  On antidiarrheal medications PRN.

## 2022-10-21 NOTE — ED Provider Notes (Signed)
  Physical Exam  BP (!) 105/59   Pulse 88   Temp 97.8 F (36.6 C) (Rectal)   Resp 17   Ht 5\' 2"  (1.575 m)   Wt 45.4 kg   SpO2 94%   BMI 18.31 kg/m   Physical Exam Vitals and nursing note reviewed.  Constitutional:      General: She is not in acute distress.    Appearance: She is well-developed.  HENT:     Head: Normocephalic and atraumatic.  Eyes:     Conjunctiva/sclera: Conjunctivae normal.  Cardiovascular:     Rate and Rhythm: Normal rate and regular rhythm.     Heart sounds: No murmur heard. Pulmonary:     Effort: Pulmonary effort is normal. No respiratory distress.  Musculoskeletal:        General: No swelling.     Cervical back: Neck supple.  Skin:    General: Skin is warm and dry.     Capillary Refill: Capillary refill takes less than 2 seconds.     Findings: Lesion present.  Neurological:     Mental Status: She is alert.  Psychiatric:        Mood and Affect: Mood normal.     Procedures  Procedures  ED Course / MDM    Medical Decision Making Amount and/or Complexity of Data Reviewed Labs: ordered. Radiology: ordered. ECG/medicine tests: ordered.  Risk Prescription drug management.   Patient received in handoff.  Progressive worsening weakness and concern for infected sacral wound with new AKI and leukocytosis.  Pending MRI.  MRI reassuringly negative for osteomyelitis.  Gentle fluid resuscitation begun and patient require hospital admission for new AKI and wound infection       Glendora Score, MD 10/21/22 1659

## 2022-10-21 NOTE — ED Provider Notes (Signed)
[place] EMERGENCY DEPARTMENT AT [facility] Provider Note   CSN: [id number] Arrival date & time: 10/21/22  1220     History  Chief Complaint  Patient presents with   Weakness    [name] is a 89 y.o. female.   Weakness Patient presents for generalized weakness.  She arrives via EMS from home.  History is provided initially by EMS and subsequently by her son.  Son reports that she has been bedbound for the past several years.  Typically, she is able to assist with transfers.  She has chronic diarrhea.  She has a chronic wound to her sacral area.  She has recently had worsened foot swelling.  Diuretic dose was doubled.  Over the past several days, she has had generalized weakness.  She is no longer able to assist at all with transfers.  She has not complained of anything but son states that she never would. EMS reports normal vital signs during transit.     Home Medications Prior to Admission medications   Medication Sig Start Date End Date Taking? Authorizing Provider  Calcium Carbonate-Vitamin D (CALCIUM + D PO) Take 1 tablet by mouth daily.    [provider]  cyanocobalamin (,VITAMIN B-12,) 1000 MCG/ML injection Inject 1,000 mcg into the muscle every 30 (thirty) days.    [provider]  diltiazem (DILACOR XR) 180 MG 24 hr capsule Take 180 mg by mouth daily.    [provider]  ibuprofen (ADVIL,MOTRIN) 600 MG tablet Take 1 tablet (600 mg total) by mouth 3 (three) times daily. 10/04/14   Vickki Hearing, MD  loperamide (IMODIUM) 2 MG capsule Take 2 mg by mouth daily.    [provider]  losartan-hydrochlorothiazide (HYZAAR) 100-25 MG per tablet Take 1 tablet by mouth daily.    [provider]  warfarin (COUMADIN) 3 MG tablet Take 3-4.5 mg by mouth daily. Take 1.5 tablets PO Tuesday,  Wednesday, Friday and Saturday.  Take 1 tablet Monday and Thursday.    [provider]      Allergies    Patient has no  known allergies.    Review of Systems   Review of Systems  Cardiovascular:  Positive for leg swelling.  Skin:  Positive for wound.  Neurological:  Positive for weakness (Generalized).    Physical Exam Updated Vital Signs BP (!) 105/59   Pulse 88   Temp 97.8 F (36.6 C) (Rectal)   Resp 17   Ht 5\' 2"  (1.575 m)   Wt 45.4 kg   SpO2 94%   BMI 18.31 kg/m  Physical Exam Vitals and nursing note reviewed.  Constitutional:      General: She is not in acute distress.    Appearance: She is well-developed and underweight. She is not toxic-appearing or diaphoretic.  HENT:     Head: Normocephalic and atraumatic.     Right Ear: External ear normal.     Left Ear: External ear normal.     Nose: Nose normal.     Mouth/Throat:     Mouth: Mucous membranes are moist.  Eyes:     Extraocular Movements: Extraocular movements intact.     Conjunctiva/sclera: Conjunctivae normal.  Cardiovascular:     Rate and Rhythm: Normal rate and regular rhythm.     Heart sounds: No murmur heard. Pulmonary:     Effort: Pulmonary effort is normal. No respiratory distress.     Breath sounds: Normal breath sounds. No wheezing or rales.  Chest:  Chest wall: No tenderness.  Abdominal:     General: There is no distension.     Palpations: Abdomen is soft.     Tenderness: There is no abdominal tenderness.  Musculoskeletal:        General: No swelling.     Cervical back: Normal range of motion and neck supple.     Right lower leg: No edema.     Left lower leg: No edema.     Comments: Large left-sided sacral wound.  Skin:    General: Skin is warm and dry.     Findings: Bruising present.  Neurological:     General: No focal deficit present.     Mental Status: She is alert.  Psychiatric:        Mood and Affect: Mood normal.        Behavior: Behavior normal.     ED Results / Procedures / Treatments   Labs (all labs ordered are listed, but only abnormal results are displayed) Labs Reviewed  LACTIC  ACID, PLASMA - Abnormal; Notable for the following components:      Result Value   Lactic Acid, Venous 2.1 (*)    All other components within normal limits  COMPREHENSIVE METABOLIC PANEL - Abnormal; Notable for the following components:   Sodium 132 (*)    Chloride 92 (*)    Glucose, Bld 108 (*)    BUN 48 (*)    Creatinine, Ser 2.06 (*)    Albumin 2.9 (*)    GFR, Estimated 21 (*)    All other components within normal limits  CBC WITH DIFFERENTIAL/PLATELET - Abnormal; Notable for the following components:   WBC 15.6 (*)    RBC 3.68 (*)    Hemoglobin 9.6 (*)    HCT 30.0 (*)    RDW 17.0 (*)    Platelets 532 (*)    Neutro Abs 11.8 (*)    Monocytes Absolute 1.1 (*)    Abs Immature Granulocytes 0.08 (*)    All other components within normal limits  BRAIN NATRIURETIC PEPTIDE - Abnormal; Notable for the following components:   B Natriuretic Peptide 195.0 (*)    All other components within normal limits  TROPONIN I (HIGH SENSITIVITY) - Abnormal; Notable for the following components:   Troponin I (High Sensitivity) 19 (*)    All other components within normal limits  CULTURE, BLOOD (ROUTINE X 2)  CULTURE, BLOOD (ROUTINE X 2)  C DIFFICILE QUICK SCREEN W PCR REFLEX    GASTROINTESTINAL PANEL BY PCR, STOOL (REPLACES STOOL CULTURE)  LACTIC ACID, PLASMA  PROTIME-INR  URINALYSIS, W/ REFLEX TO CULTURE (INFECTION SUSPECTED)  TROPONIN I (HIGH SENSITIVITY)    EKG EKG Interpretation  Date/Time:  Monday October 21 2022 12:38:25 EDT Ventricular Rate:  84 PR Interval:  220 QRS Duration: 88 QT Interval:  373 QTC Calculation: 441 R Axis:   62 Text Interpretation: Sinus rhythm Prolonged PR interval Nonspecific T abnormalities, lateral leads Confirmed by Gloris Manchester [phone]) on 10/21/2022 1:23:44 PM  Radiology DG Chest Port 1 View  Result Date: 10/21/2022 CLINICAL DATA:  Questionable sepsis. Weakness and diarrhea for 1 week. Patient is no longer able to ambulate with her walker. EXAM: PORTABLE  CHEST 1 VIEW COMPARISON:  Two-view chest x-ray 06/05/2009 FINDINGS: The heart size is upper limits of normal. Atherosclerotic calcifications are present at the aortic arch. Changes of COPD are noted. No edema or effusion is present. No focal airspace disease is present. Asymmetric degenerative changes are present at the left  shoulder. IMPRESSION: 1. No acute cardiopulmonary disease. 2. COPD. 3. Aortic atherosclerosis. Electronically Signed   By: Marin Roberts M.D.   On: 10/21/2022 13:23    Procedures Procedures    Medications Ordered in ED Medications  metroNIDAZOLE (FLAGYL) IVPB 500 mg (500 mg Intravenous New Bag/Given 10/21/22 1610)  vancomycin (VANCOCIN) IVPB 1000 mg/200 mL premix (1,000 mg Intravenous New Bag/Given 10/21/22 1614)    Followed by  vancomycin (VANCOREADY) IVPB 500 mg/100 mL (has no administration in time range)  ceFEPIme (MAXIPIME) 2 g in sodium chloride 0.9 % 100 mL IVPB (2 g Intravenous New Bag/Given 10/21/22 1608)    Followed by  ceFEPIme (MAXIPIME) 1 g in sodium chloride 0.9 % 100 mL IVPB (has no administration in time range)  lactated ringers bolus 250 mL (0 mLs Intravenous Stopped 10/21/22 1319)  gadobutrol (GADAVIST) 1 MMOL/ML injection 5 mL (5 mLs Intravenous Contrast Given 10/21/22 1542)    ED Course/ Medical Decision Making/ A&P                             Medical Decision Making Amount and/or Complexity of Data Reviewed Labs: ordered. Radiology: ordered. ECG/medicine tests: ordered.  Risk Prescription drug management.   This patient presents to the ED for concern of generalized weakness, this involves an extensive number of treatment options, and is a complaint that carries with it a high risk of complications and morbidity.  The differential diagnosis includes deconditioning, dehydration, infection, metabolic derangements   Co morbidities that complicate the patient evaluation  Prior DVT, osteoporosis, remote uterine cancer   Additional history  obtained:  Additional history obtained from EMS, patient's son External records from outside source obtained and reviewed including EMR   Lab Tests:  I Ordered, and personally interpreted labs.  The pertinent results include: Leukocytosis is present.  Hemoglobin is decreased from remote prior lab work.  BNP is mildly elevated.  Creatinine appears consistent with remote prior lab work.  Lactate is normal.  Troponin is normal.   Imaging Studies ordered:  I ordered imaging studies including chest x-ray, MRI pelvis I independently visualized and interpreted imaging which showed no acute findings on x-ray.  MRI is pending at time of signout. I agree with the radiologist interpretation   Cardiac Monitoring: / EKG:  The patient was maintained on a cardiac monitor.  I personally viewed and interpreted the cardiac monitored which showed an underlying rhythm of: Sinus rhythm   Problem List / ED Course / Critical interventions / Medication management  Patient presenting from home for generalized weakness.  She does have a worsening wound to area of left sacrum.  On inspection, there does appear to be some purulent drainage from the site.  Currently, patient's vital signs are normal.  When moving her in the bed, she does endorse pain.  When at rest, she declines any pain medication.  Gentle IV fluids were ordered.  Broad-spectrum antibiotics were ordered for empiric treatment of infected wound.  MRI of this area was ordered.  Lab work is notable for leukocytosis, further raising concern of acute infection.  MRI was pending at time of signout.  Care of patient was signed out to oncoming provider. I ordered medication including IV fluids for hydration; spectrum antibiotics for empiric treatment of infected sacral wound. Reevaluation of the patient after these medicines showed that the patient improved I have reviewed the patients home medicines and have made adjustments as needed   Social  Determinants  of Health:  Lives at home with family, bedbound baseline        Final Clinical Impression(s) / ED Diagnoses Final diagnoses:  Generalized weakness  Wound of sacral region, initial encounter    Rx / DC Orders ED Discharge Orders     None         Gloris Manchester, MD 10/21/22 1630

## 2022-10-21 NOTE — H&P (Addendum)
History and Physical    [name]:[id number] DOB: [birth year] DOA: 10/21/2022  PCP: Carylon Perches, MD   Patient coming from: Home  I have personally briefly reviewed patient's old medical records in [facility]  Chief Complaint: Weakness, Sacral wound  HPI: [name] is a 89 y.o. female with medical history significant for hypertension, pulmonary embolism, small bowel obstruction. Patient is brought to the ED with complaints of generalized weakness, and pain to her back.  Patient is mostly bedbound at baseline, but she is able to assist with transfers.  She has a sacral decubitus wound, daughter at bedside - June, states the wound started 2 weeks ago.  She also has adhered to her right forearm over the past week.  Patient has some bilateral lower extremity swelling, so patient son increased the dose of her Lasix from 20 to 60 mg over the past 3 days.  Since then patient has become generally more weak.  Patient has a caregiver, but her Son, [name] who is his HCPOA lives close by and checks on her frequently. Patient has chronic diarrhea that is unchanged. Patient is hard of hearing, and is barely able to see, but per daughter has no memory deficits whatsoever.  He is able to answer questions.  She reports some tingling feeling with urination.  Otherwise she denies chest pain or difficulty breathing.  ED Course: Temperature 97.8.  Heart rate 70s to 80s, respiratory rate 13-26.  Blood pressure systolic 105-170.  O2 sats greater than 92% on room air. Leukocytosis of 15.6.  Lactic acidosis of 2.1 > 1.1.  Troponin 19> 17.  BNP 195.  Port chest x-ray negative for acute abnormality. MRI- pelvis with and without contrast-shows sacral decubitus ulcer without underlying osteomyelitis or discrete drainable abscess. Sacral wound with purulent drainage, broad-spectrum antibiotics IV vancomycin and cefepime started.  Hospitalist to admit.  Review of Systems: As per HPI all other systems  reviewed and negative.  Past Medical History:  Diagnosis Date   DDD (degenerative disc disease), lumbar    DVT (deep venous thrombosis) (HCC)    Hip fracture (HCC)    left   Hypertension    Osteoporosis    PE (pulmonary embolism)    SBO (small bowel obstruction) (HCC) 2007   Sigmoid diverticulosis    Uterine cancer (HCC) 1993   s/p radiation therapy and hysterectomy    Past Surgical History:  Procedure Laterality Date   ABDOMINAL ADHESION SURGERY  2007   Dr. Jamey Ripa   ABDOMINAL HYSTERECTOMY  1993   APPENDECTOMY  2007   BOWEL RESECTION  2007   terminal ileum (Dr. Jamey Ripa)   CHOLECYSTECTOMY  2006   HIP SURGERY Left 2007   Total hip replacement     reports that she has never smoked. She has never been exposed to tobacco smoke. She has never used smokeless tobacco. She reports that she does not drink alcohol and does not use drugs.  No Known Allergies  Family history of hypertension.  Prior to Admission medications   Medication Sig Start Date End Date Taking? Authorizing Provider  Calcium Carbonate-Vitamin D (CALCIUM + D PO) Take 1 tablet by mouth daily.    [provider]  cyanocobalamin (,VITAMIN B-12,) 1000 MCG/ML injection Inject 1,000 mcg into the muscle every 30 (thirty) days.    [provider]  diltiazem (DILACOR XR) 180 MG 24 hr capsule Take 180 mg by mouth daily.    [provider]  ibuprofen (ADVIL,MOTRIN) 600 MG tablet Take  1 tablet (600 mg total) by mouth 3 (three) times daily. 10/04/14   Vickki Hearing, MD  loperamide (IMODIUM) 2 MG capsule Take 2 mg by mouth daily.    [provider]  losartan-hydrochlorothiazide (HYZAAR) 100-25 MG per tablet Take 1 tablet by mouth daily.    [provider]  warfarin (COUMADIN) 3 MG tablet Take 3-4.5 mg by mouth daily. Take 1.5 tablets PO Tuesday,  Wednesday, Friday and Saturday.  Take 1 tablet Monday and Thursday.    [provider]    Physical Exam: Vitals:   10/21/22  1400 10/21/22 1430 10/21/22 1500 10/21/22 1645  BP: (!) 126/51 (!) 119/56 (!) 134/50 (!) 129/54  Pulse: 72 72 71 70  Resp: 20 11 15 14   Temp:      TempSrc:      SpO2: 96% 96% 96% 95%  Weight:      Height:        Constitutional: NAD, calm, comfortable Vitals:   10/21/22 1400 10/21/22 1430 10/21/22 1500 10/21/22 1645  BP: (!) 126/51 (!) 119/56 (!) 134/50 (!) 129/54  Pulse: 72 72 71 70  Resp: 20 11 15 14   Temp:      TempSrc:      SpO2: 96% 96% 96% 95%  Weight:      Height:       Eyes: PERRL, lids and conjunctivae normal ENMT: Mucous membranes are moist.   Neck: normal, supple, no masses, no thyromegaly Respiratory: clear to auscultation bilaterally, no wheezing, no crackles. Normal respiratory effort. No accessory muscle use.  Cardiovascular: Regular rate and rhythm, no murmurs / rubs / gallops.  Slight puffiness localized to some areas of feet- dependent, extremities warm.  Abdomen: no tenderness, no masses palpated. No hepatosplenomegaly. Bowel sounds positive.  Musculoskeletal: no clubbing / cyanosis. No joint deformity upper and lower extremities.  Skin: Skin tear to right forearm, with purulent drainage, round sacral decubitus ulcer with drainage also, wound appears infected,with eschar, some mild surrounding erythema--that is not exactly tender to palpation.  Neurologic: No facial asymmetry, speech fluent, good grip strength of bilateral upper extremity, not cooperating with lower extremity exam, either hard of hearing, or baseline weakness as patient is mostly bedbound.  Psychiatric: Normal judgment and insight. Alert and oriented x 3. Normal mood.        Labs on Admission: I have personally reviewed following labs and imaging studies  CBC: Recent Labs  Lab 10/21/22 1245  WBC 15.6*  NEUTROABS 11.8*  HGB 9.6*  HCT 30.0*  MCV 81.5  PLT 532*   Basic Metabolic Panel: Recent Labs  Lab 10/21/22 1245  NA 132*  K 3.6  CL 92*  CO2 28  GLUCOSE 108*  BUN 48*   CREATININE 2.06*  CALCIUM 9.2   GFR: Estimated Creatinine Clearance: 9.9 mL/min (A) (by C-G formula based on SCr of 2.06 mg/dL (H)). Liver Function Tests: Recent Labs  Lab 10/21/22 1245  AST 15  ALT 9  ALKPHOS 52  BILITOT 0.7  PROT 6.9  ALBUMIN 2.9*   Coagulation Profile: Recent Labs  Lab 10/21/22 1245  INR 1.1   Radiological Exams on Admission: MR PELVIS W WO CONTRAST  Result Date: 10/21/2022 CLINICAL DATA:  Sacral wound. EXAM: MRI PELVIS WITHOUT AND WITH CONTRAST TECHNIQUE: Multiplanar multisequence MR imaging of the pelvis was performed both before and after administration of intravenous contrast. CONTRAST:  5mL GADAVIST GADOBUTROL 1 MMOL/ML IV SOLN COMPARISON:  CT abdomen pelvis dated August 16, 2012. FINDINGS: Urinary Tract:  No  abnormality visualized. Bowel:  Sigmoid diverticulosis. Vascular/Lymphatic: No pathologically enlarged lymph nodes. No significant vascular abnormality seen. Reproductive:  Prior hysterectomy. Other: Chronic common bile duct dilatation status post cholecystectomy. Musculoskeletal: Sacral decubitus ulcer without underlying bony signal abnormality. Soft tissue enhancement around the decubitus ulcer without discrete fluid collection. Prior left hip arthroplasty. Small amount of fluid in the right greater trochanteric bursa. IMPRESSION: 1. Sacral decubitus ulcer without underlying osteomyelitis or discrete drainable abscess. Electronically Signed   By: Obie Dredge M.D.   On: 10/21/2022 16:29   DG Chest Port 1 View  Result Date: 10/21/2022 CLINICAL DATA:  Questionable sepsis. Weakness and diarrhea for 1 week. Patient is no longer able to ambulate with her walker. EXAM: PORTABLE CHEST 1 VIEW COMPARISON:  Two-view chest x-ray 06/05/2009 FINDINGS: The heart size is upper limits of normal. Atherosclerotic calcifications are present at the aortic arch. Changes of COPD are noted. No edema or effusion is present. No focal airspace disease is present. Asymmetric  degenerative changes are present at the left shoulder. IMPRESSION: 1. No acute cardiopulmonary disease. 2. COPD. 3. Aortic atherosclerosis. Electronically Signed   By: Marin Roberts M.D.   On: 10/21/2022 13:23    EKG: Independently reviewed.  Sinus rhythm, rate 84, QTc 441.  No significant change from prior.    Assessment/Plan Principal Problem:   Infected decubitus ulcer Active Problems:   CKD (chronic kidney disease) stage 4, GFR 15-29 ml/min (HCC)   Essential (primary) hypertension   Chronic diarrhea   Assessment and Plan: * Infected decubitus ulcer Ulcer appears infected, with eschar.  At least stage III ulcer afebrile, WBC 15.6.  Rules out for sepsis.  Lactic acid 2.1 > 1.1.  MRI orbits with and without contrast negative for osteomyelitis or discrete drainable abscess.  Also wants to right forearm, - Gen surgery consult if debridement would be helpful -Otherwise will need wound care consult, offloading -Daily dressings of wound. -Follow-up UA, she reports some tingling with urination -Continue IV vancomycin and cefepime -1.75 L of LR bolus given. -Hold Further fluids for now -Follow-up blood cultures obtained in ED  CKD (chronic kidney disease) stage 4, GFR 15-29 ml/min (HCC) Cr- 2.06 today, last checked 10 years ago creatinine was 1.8. -1.725L was given,holding off on further fluids  Chronic diarrhea Unchanged in severity, monitor.  On antidiarrheal medications PRN.  Essential (primary) hypertension Stable. -Per medications at bedside, patient is only on Lasix 20 mg   DVT prophylaxis: Heparin Code Status: DNR- confirmed with the patient, her daughter June at bedside, and son on the phone [name]. Family Communication: Daughter June at bedside.  Patient's son [name] is HCPOA. Disposition Plan: ~ 2days Consults called: Gen Surg Admission status: INPt tele  I certify that at the point of admission it is my clinical judgment that the patient will require inpatient  hospital care spanning beyond 2 midnights from the point of admission due to high intensity of service, high risk for further deterioration and high frequency of surveillance required.    Author: Onnie Boer, MD 10/21/2022 8:06 PM  For on call review [email or web address].

## 2022-10-21 NOTE — Assessment & Plan Note (Addendum)
Ulcer appears infected, with eschar.  At least stage III ulcer afebrile, WBC 15.6.  Rules out for sepsis.  Lactic acid 2.1 > 1.1.  MRI orbits with and without contrast negative for osteomyelitis or discrete drainable abscess.  Also wants to right forearm, - Gen surgery consult if debridement would be helpful -Otherwise will need wound care consult, offloading -Daily dressings of wound. -Follow-up UA, she reports some tingling with urination -Continue IV vancomycin and cefepime -1.75 L of LR bolus given. -Hold Further fluids for now -Follow-up blood cultures obtained in ED

## 2022-10-22 DIAGNOSIS — N184 Chronic kidney disease, stage 4 (severe): Secondary | ICD-10-CM

## 2022-10-22 DIAGNOSIS — K529 Noninfective gastroenteritis and colitis, unspecified: Secondary | ICD-10-CM

## 2022-10-22 DIAGNOSIS — I10 Essential (primary) hypertension: Secondary | ICD-10-CM

## 2022-10-22 DIAGNOSIS — L089 Local infection of the skin and subcutaneous tissue, unspecified: Secondary | ICD-10-CM

## 2022-10-22 DIAGNOSIS — L8990 Pressure ulcer of unspecified site, unspecified stage: Secondary | ICD-10-CM

## 2022-10-22 LAB — URINALYSIS, W/ REFLEX TO CULTURE (INFECTION SUSPECTED)
Bilirubin Urine: NEGATIVE
Glucose, UA: NEGATIVE mg/dL
Hgb urine dipstick: NEGATIVE
Ketones, ur: NEGATIVE mg/dL
Nitrite: NEGATIVE
Protein, ur: 30 mg/dL — AB
Specific Gravity, Urine: 1.012 (ref 1.005–1.030)
pH: 7 (ref 5.0–8.0)

## 2022-10-22 LAB — CBC
HCT: 25.3 % — ABNORMAL LOW (ref 36.0–46.0)
Hemoglobin: 8.1 g/dL — ABNORMAL LOW (ref 12.0–15.0)
MCH: 26.2 pg (ref 26.0–34.0)
MCHC: 32 g/dL (ref 30.0–36.0)
MCV: 81.9 fL (ref 80.0–100.0)
Platelets: 491 10*3/uL — ABNORMAL HIGH (ref 150–400)
RBC: 3.09 MIL/uL — ABNORMAL LOW (ref 3.87–5.11)
RDW: 17 % — ABNORMAL HIGH (ref 11.5–15.5)
WBC: 13.2 10*3/uL — ABNORMAL HIGH (ref 4.0–10.5)
nRBC: 0 % (ref 0.0–0.2)

## 2022-10-22 LAB — BASIC METABOLIC PANEL
Anion gap: 10 (ref 5–15)
BUN: 41 mg/dL — ABNORMAL HIGH (ref 8–23)
CO2: 26 mmol/L (ref 22–32)
Calcium: 8.3 mg/dL — ABNORMAL LOW (ref 8.9–10.3)
Chloride: 99 mmol/L (ref 98–111)
Creatinine, Ser: 1.9 mg/dL — ABNORMAL HIGH (ref 0.44–1.00)
GFR, Estimated: 23 mL/min — ABNORMAL LOW (ref >60)
Glucose, Bld: 91 mg/dL (ref 70–99)
Potassium: 4 mmol/L (ref 3.5–5.1)
Sodium: 135 mmol/L (ref 135–145)

## 2022-10-22 LAB — CULTURE, BLOOD (ROUTINE X 2)

## 2022-10-22 MED ORDER — MEDIHONEY WOUND/BURN DRESSING EX PSTE
1.0000 | PASTE | Freq: Every day | CUTANEOUS | Status: DC
  Administered 2022-10-22 – 2022-10-25 (×4): 1 via TOPICAL
  Filled 2022-10-22: qty 44

## 2022-10-22 MED ORDER — LACTATED RINGERS IV SOLN: INTRAVENOUS | Status: DC

## 2022-10-22 NOTE — Progress Notes (Signed)
Progress Note   Patient: [name] AOZ:[id number] DOB: 1918/05/10 DOA: 10/21/2022     1 DOS: the patient was seen and examined on 10/22/2022   Brief hospital patient admission narrative: As per H&P written by Dr. Mariea Clonts on 10/21/2022 [name] is a 89 y.o. female with medical history significant for hypertension, pulmonary embolism, small bowel obstruction. Patient is brought to the ED with complaints of generalized weakness, and pain to her back.  Patient is mostly bedbound at baseline, but she is able to assist with transfers.  She has a sacral decubitus wound, daughter at bedside - June, states the wound started 2 weeks ago.  She also has adhered to her right forearm over the past week.  Patient has some bilateral lower extremity swelling, so patient son increased the dose of her Lasix from 20 to 60 mg over the past 3 days.  Since then patient has become generally more weak.  Patient has a caregiver, but her Son, [name] who is his HCPOA lives close by and checks on her frequently. Patient has chronic diarrhea that is unchanged. Patient is hard of hearing, and is barely able to see, but per daughter has no memory deficits whatsoever.  She is able to answer questions.  She reports some tingling feeling with urination.  Otherwise she denies chest pain or difficulty breathing.   ED Course: Temperature 97.8.  Heart rate 70s to 80s, respiratory rate 13-26.  Blood pressure systolic 105-170.  O2 sats greater than 92% on room air. Leukocytosis of 15.6.  Lactic acidosis of 2.1 > 1.1.  Troponin 19> 17.  BNP 195.  Port chest x-ray negative for acute abnormality. MRI- pelvis with and without contrast-shows sacral decubitus ulcer without underlying osteomyelitis or discrete drainable abscess. Sacral wound with purulent drainage, broad-spectrum antibiotics started.  Assessment and Plan: * Infected decubitus ulcer -Unstageable sacral wound appreciated on exam; erythematous changes surrounding wound  and follow recommendation by wound care service General surgery has been contacted for evaluation of possible debridement. -Will continue current antibiotics -Follow WBCs, culture results and clinical response.   -MRI not demonstrating presence of osteomyelitis/abscesses.   CKD (chronic kidney disease) stage 4, GFR 15-29 ml/min (HCC) -Chronic kidney disease stage IV at baseline -Continue to maintain adequate hydration and minimizing nephrotoxic agents -Follow renal function trend.  Chronic diarrhea -Unchanged in severity, monitor.   -Continue as needed antidiarrheal medications.  Essential (primary) hypertension -Blood pressure stable overall -At this moment we will continue holding the use of Lasix -Heart healthy diet discussed with patient.   Subjective:  Afebrile, no chest pain, no nausea, no vomiting.  Chronically ill in appearance and reporting feeling better.  Positive sacral wound appreciated on exam; no active drainage, positive erythematous changes appreciated.  Physical Exam: Vitals:   10/22/22 0003 10/22/22 0502 10/22/22 0758 10/22/22 0958  BP: (!) 163/74 (!) 153/53 (!) 153/52 (!) 126/56  Pulse: 84 83 92 93  Resp: 20 20 19 19   Temp: (!) 97.4 F (36.3 C) 98 F (36.7 C) 98.5 F (36.9 C) 98.6 F (37 C)  TempSrc: Oral Oral Oral Oral  SpO2: 99% 96% 98% 98%  Weight:      Height:       General exam: Alert, awake, following commands appropriately and demonstrating generalized weakness; patient is mildly hard of hearing and also legally blind. Respiratory system: Clear to auscultation. Respiratory effort normal.  Good saturation on room air. Cardiovascular system:RRR. No rubs or gallops; no JVD. Gastrointestinal system: Abdomen is nondistended,  soft and nontender. No organomegaly or masses felt. Normal bowel sounds heard. Central nervous system: Patient was able to move 4 limbs spontaneously; no focal deficits. Extremities: No cyanosis, clubbing or edema. Skin: No  petechiae; unstageable sacral wound appreciated on examination with surrounding erythematous changes.  Scar/slough present on exam. Psychiatry: Mood & affect appropriate.   Data Reviewed: Basic metabolic panel: Sodium 135, potassium 4.4, chloride 99, bicarb 26, BUN 41, creatinine 1.90 and GFR 23.  Anion gap 10 CBC: White blood cells 13.2, hemoglobin 8.1 and platelet count 491 K.  Family Communication: Daughter at bedside.  Disposition: Status is: Inpatient Remains inpatient appropriate because: Continue IV antibiotics.   Planned Discharge Destination: Home with Home Health  Time spent: 50 minutes  Author: Vassie Loll, MD 10/22/2022 1:45 PM  For on call review [email or web address].

## 2022-10-22 NOTE — Progress Notes (Signed)
Transition of Care [facility]) - Inpatient Brief Assessment   Patient Details  Name: [name] MRN: [id number] Date of Birth: 02-Jul-1918  Transition of Care [facility]) CM/SW Contact:    Annice Needy, LCSW Phone Number: 10/22/2022, 11:51 AM   Clinical Narrative: Transition of Care Department [facility]) has reviewed patient and no TOC needs have been identified at this time. We will continue to monitor patient advancement through interdisciplinary progression rounds. If new patient transition needs arise, please place a TOC consult.   Transition of Care Asessment: Insurance and Status: Insurance coverage has been reviewed Patient has primary care physician: Yes Home environment has been reviewed: from home, has caregiver, son lives nearby and checks on her frequently throughout the day. Prior level of function:: wheelchair Prior/Current Home Services: No current home services Social Determinants of Health Reivew: SDOH reviewed no interventions necessary Readmission risk has been reviewed: Yes Transition of care needs: no transition of care needs at this time

## 2022-10-22 NOTE — Consult Note (Signed)
WOC Nurse Consult Note: Reason for Consult: Consult requested for sacrum wound.  Performed remotely after review of progress notes and photos in the EMR.  Wound type: Chronic Unstageable pressure injury to the left sacrum, 100% loose slough/eschar. According to the bedside nurse wound flow sheet the measurements are 3.5X5X1cm, mod amt tan drainage.  Pressure Injury POA: Yes MRI did not indicate osteomyelitis. Secure chat message sent to primary team to indicate if aggressive plan of care is desired, then consult surgical team for possible bedside debridement. Dressing procedure/placement/frequency: Topical treatment orders provided for bedside nurses to perform as follows to assist with removal of nonviable tissue: Apply Medihoney to sacrum wound Q day, then cover with foam dressing.  Change foam dressing Q 3 days or PRN soiling. Please re-consult if further assistance is needed.  Thank-you,  Cammie Mcgee MSN, RN, CWOCN, [place], CNS [phone]

## 2022-10-23 DIAGNOSIS — L8993 Pressure ulcer of unspecified site, stage 3: Secondary | ICD-10-CM

## 2022-10-23 DIAGNOSIS — R531 Weakness: Secondary | ICD-10-CM

## 2022-10-23 DIAGNOSIS — I10 Essential (primary) hypertension: Secondary | ICD-10-CM | POA: Diagnosis not present

## 2022-10-23 DIAGNOSIS — S31000A Unspecified open wound of lower back and pelvis without penetration into retroperitoneum, initial encounter: Secondary | ICD-10-CM

## 2022-10-23 LAB — CBC
HCT: 24.2 % — ABNORMAL LOW (ref 36.0–46.0)
Hemoglobin: 7.5 g/dL — ABNORMAL LOW (ref 12.0–15.0)
MCH: 25.8 pg — ABNORMAL LOW (ref 26.0–34.0)
MCHC: 31 g/dL (ref 30.0–36.0)
MCV: 83.2 fL (ref 80.0–100.0)
Platelets: 491 10*3/uL — ABNORMAL HIGH (ref 150–400)
RBC: 2.91 MIL/uL — ABNORMAL LOW (ref 3.87–5.11)
RDW: 17.1 % — ABNORMAL HIGH (ref 11.5–15.5)
WBC: 13.3 10*3/uL — ABNORMAL HIGH (ref 4.0–10.5)
nRBC: 0 % (ref 0.0–0.2)

## 2022-10-23 LAB — BASIC METABOLIC PANEL
Anion gap: 10 (ref 5–15)
BUN: 31 mg/dL — ABNORMAL HIGH (ref 8–23)
CO2: 24 mmol/L (ref 22–32)
Calcium: 8.2 mg/dL — ABNORMAL LOW (ref 8.9–10.3)
Chloride: 100 mmol/L (ref 98–111)
Creatinine, Ser: 1.8 mg/dL — ABNORMAL HIGH (ref 0.44–1.00)
GFR, Estimated: 25 mL/min — ABNORMAL LOW (ref >60)
Glucose, Bld: 99 mg/dL (ref 70–99)
Potassium: 3.7 mmol/L (ref 3.5–5.1)
Sodium: 134 mmol/L — ABNORMAL LOW (ref 135–145)

## 2022-10-23 LAB — CULTURE, BLOOD (ROUTINE X 2): Special Requests: ADEQUATE

## 2022-10-23 LAB — C DIFFICILE QUICK SCREEN W PCR REFLEX
C Diff antigen: POSITIVE — AB
C Diff toxin: NEGATIVE

## 2022-10-23 LAB — PROCALCITONIN: Procalcitonin: 0.19 ng/mL

## 2022-10-23 LAB — MAGNESIUM: Magnesium: 1.6 mg/dL — ABNORMAL LOW (ref 1.7–2.4)

## 2022-10-23 LAB — CK: Total CK: 19 U/L — ABNORMAL LOW (ref 38–234)

## 2022-10-23 LAB — VITAMIN B12: Vitamin B-12: 871 pg/mL (ref 180–914)

## 2022-10-23 MED ORDER — DILTIAZEM HCL ER COATED BEADS 120 MG PO CP24
120.0000 mg | ORAL_CAPSULE | Freq: Every day | ORAL | Status: DC
  Administered 2022-10-23 – 2022-10-25 (×3): 120 mg via ORAL
  Filled 2022-10-23 (×3): qty 1

## 2022-10-23 MED ORDER — DILTIAZEM HCL ER COATED BEADS 120 MG PO CP24: 240.0000 mg | ORAL_CAPSULE | Freq: Every day | ORAL | Status: DC

## 2022-10-23 NOTE — Evaluation (Signed)
Physical Therapy Evaluation Patient Details Name: [name] MRN: [id number] DOB: December 18, 1918 Today's Date: 10/23/2022  History of Present Illness  [name] is a 89 y.o. female with medical history significant for hypertension, pulmonary embolism, small bowel obstruction.  Patient is brought to the ED with complaints of generalized weakness, and pain to her back.  Patient is mostly bedbound at baseline, but she is able to assist with transfers.  She has a sacral decubitus wound, daughter at bedside - [name], states the wound started 2 weeks ago.  She also has adhered to her right forearm over the past week.  Patient has some bilateral lower extremity swelling, so patient son increased the dose of her Lasix from 20 to 60 mg over the past 3 days.  Since then patient has become generally more weak.  Patient has a caregiver, but her Son, [name] who is his HCPOA lives close by and checks on her frequently.  Patient has chronic diarrhea that is unchanged.  Patient is hard of hearing, and is barely able to see, but per daughter has no memory deficits whatsoever.  He is able to answer questions.  She reports some tingling feeling with urination.  Otherwise she denies chest pain or difficulty breathing.   Clinical Impression  Patient demonstrates slow labored movement for sitting up at bedside with c/o severe pain over sacral wound, once seated able to maintain sitting balance, very unsteady on feet with frequent scissoring of legs and limited to a few unsteady shuffling side steps before having to sit due to fatigue and poor standing balance.  Patient tolerated sitting up in chair after therapy - nursing staff aware.  Patient will benefit from continued skilled physical therapy in hospital and recommended venue below to increase strength, balance, endurance for safe ADLs and gait.          Recommendations for follow up therapy are one component of a multi-disciplinary discharge planning process, led by  the attending physician.  Recommendations may be updated based on patient status, additional functional criteria and insurance authorization.  Follow Up Recommendations Can patient physically be transported by private vehicle: No     Assistance Recommended at Discharge Set up Supervision/Assistance  Patient can return home with the following  A lot of help with bathing/dressing/bathroom;A lot of help with walking and/or transfers;Help with stairs or ramp for entrance;Assistance with cooking/housework    Equipment Recommendations None recommended by PT  Recommendations for Other Services       Functional Status Assessment Patient has had a recent decline in their functional status and demonstrates the ability to make significant improvements in function in a reasonable and predictable amount of time.     Precautions / Restrictions Precautions Precautions: Fall Restrictions Weight Bearing Restrictions: No      Mobility  Bed Mobility Overal bed mobility: Needs Assistance Bed Mobility: Supine to Sit     Supine to sit: Mod assist, Max assist     General bed mobility comments: increased time, labored movement, c/o severe pain over sacral wound    Transfers Overall transfer level: Needs assistance Equipment used: Rolling walker (2 wheels) Transfers: Sit to/from Stand, Bed to chair/wheelchair/BSC Sit to Stand: Mod assist   Step pivot transfers: Mod assist, Max assist       General transfer comment: unsteady labored movement with frequent buckling of knees    Ambulation/Gait Ambulation/Gait assistance: Max assist Gait Distance (Feet): 4 Feet Assistive device: Rolling walker (2 wheels) Gait Pattern/deviations: Decreased step length -  right, Decreased step length - left, Decreased stride length, Knees buckling, Scissoring, Shuffle Gait velocity: slow     General Gait Details: limited to a few slow labored shuffling side steps with bucklig of knees and frequent loss of  balance  Stairs            Wheelchair Mobility    Modified Rankin (Stroke Patients Only)       Balance Overall balance assessment: Needs assistance Sitting-balance support: Feet supported, No upper extremity supported Sitting balance-Leahy Scale: Fair Sitting balance - Comments: seated at EOB   Standing balance support: Reliant on assistive device for balance, During functional activity, Bilateral upper extremity supported Standing balance-Leahy Scale: Poor Standing balance comment: using RW                             Pertinent Vitals/Pain Pain Assessment Pain Assessment: Faces Faces Pain Scale: Hurts whole lot Pain Location: over sacral wound Pain Descriptors / Indicators: Discomfort, Grimacing, Guarding, Moaning, Sharp Pain Intervention(s): Limited activity within patient's tolerance, Monitored during session, Repositioned    Home Living Family/patient expects to be discharged to:: Private residence Living Arrangements: Alone Available Help at Discharge: Family;Personal care attendant;Available PRN/intermittently Type of Home: House Home Access: Ramped entrance     Alternate Level Stairs-Number of Steps: 10-12 (patient states she does not go upstairs) Home Layout: Two level;Able to live on main level with bedroom/bathroom;Full bath on main level Home Equipment: Rolling Walker (2 wheels);Wheelchair - manual;BSC/3in1      Prior Function Prior Level of Function : Needs assist       Physical Assist : Mobility (physical) Mobility (physical): Bed mobility;Transfers;Gait;Stairs   Mobility Comments: Assisted transfers from lift chair using RW, mostly wheelchair bound ADLs Comments: Home aides 5 hours/day x 7 days/week     Hand Dominance   Dominant Hand: Right    Extremity/Trunk Assessment   Upper Extremity Assessment Upper Extremity Assessment: Generalized weakness    Lower Extremity Assessment Lower Extremity Assessment: Generalized  weakness    Cervical / Trunk Assessment Cervical / Trunk Assessment: Kyphotic  Communication   Communication: HOH  Cognition Arousal/Alertness: Awake/alert Behavior During Therapy: WFL for tasks assessed/performed, Anxious Overall Cognitive Status: Within Functional Limits for tasks assessed                                          General Comments      Exercises     Assessment/Plan    PT Assessment Patient needs continued PT services  PT Problem List Decreased strength;Decreased activity tolerance;Decreased balance;Decreased mobility       PT Treatment Interventions DME instruction;Gait training;Stair training;Functional mobility training;Therapeutic activities;Therapeutic exercise;Patient/family education;Balance training    PT Goals (Current goals can be found in the Care Plan section)  Acute Rehab PT Goals Patient Stated Goal: return home with home aides and family to assist PT Goal Formulation: With patient Time For Goal Achievement: 11/06/22 Potential to Achieve Goals: Good    Frequency Min 3X/week     Co-evaluation               AM-PAC PT "6 Clicks" Mobility  Outcome Measure Help needed turning from your back to your side while in a flat bed without using bedrails?: A Lot Help needed moving from lying on your back to sitting on the side of a flat bed without  using bedrails?: A Lot Help needed moving to and from a bed to a chair (including a wheelchair)?: A Lot Help needed standing up from a chair using your arms (e.g., wheelchair or bedside chair)?: A Lot Help needed to walk in hospital room?: A Lot Help needed climbing 3-5 steps with a railing? : Total 6 Click Score: 11    End of Session   Activity Tolerance: Patient tolerated treatment well;Patient limited by fatigue Patient left: in chair;with call bell/phone within reach Nurse Communication: Mobility status PT Visit Diagnosis: Unsteadiness on feet (R26.81);Other abnormalities  of gait and mobility (R26.89);Muscle weakness (generalized) (M62.81)    Time: 4098-1191 PT Time Calculation (min) (ACUTE ONLY): 27 min   Charges:   PT Evaluation $PT Eval Moderate Complexity: 1 Mod PT Treatments $Therapeutic Activity: 23-37 mins        3:32 PM, 10/23/22 [name], MPT Physical Therapist with [facility] 336 [phone] office [phone] mobile phone

## 2022-10-23 NOTE — Progress Notes (Signed)
PROGRESS NOTE  [name]:[id number] DOB: January 01, 1919 DOA: 10/21/2022 PCP: Carylon Perches, MD  Brief History:  As per H&P written by Dr. Mariea Clonts on 10/21/2022 [name] is a 89 y.o. female with medical history significant for hypertension, pulmonary embolism, small bowel obstruction. Patient is brought to the ED with complaints of generalized weakness, and pain to her back.  Patient is mostly bedbound at baseline, but she is able to assist with transfers.  She has a sacral decubitus wound, daughter at bedside - June, states the wound started 2 weeks ago.  She also has adhered to her right forearm over the past week.  Patient has some bilateral lower extremity swelling, so patient son increased the dose of her Lasix from 20 to 60 mg over the past 3 days.  Since then patient has become generally more weak.  Patient has a caregiver, but her Son, [name] who is his HCPOA lives close by and checks on her frequently. Patient has chronic diarrhea that is unchanged. Patient is hard of hearing, and is barely able to see, but per daughter has no memory deficits whatsoever.  She is able to answer questions.  She reports some tingling feeling with urination.  Otherwise she denies chest pain or difficulty breathing.   ED Course: Temperature 97.8.  Heart rate 70s to 80s, respiratory rate 13-26.  Blood pressure systolic 105-170.  O2 sats greater than 92% on room air. Leukocytosis of 15.6.  Lactic acidosis of 2.1 > 1.1.  Troponin 19> 17.  BNP 195.  Port chest x-ray negative for acute abnormality. MRI- pelvis with and without contrast-shows sacral decubitus ulcer without underlying osteomyelitis or discrete drainable abscess.  Assessment/Plan: * Infected decubitus ulcer -Unstageable sacral wound appreciated on exam; erythematous changes surrounding wound and follow recommendation by wound care service - -appreciate surgery consult and bedside debridement -I discussed with Dr. Essie Christine  tunneling, no pus -continue IV abx another 24 hours -Follow WBCs, culture results and clinical response.   -MRI negative presence of osteomyelitis/abscesses.   CKD (chronic kidney disease) stage 4, GFR 15-29 ml/min (HCC) -Chronic kidney disease stage IV at baseline -Continue to maintain adequate hydration and minimizing nephrotoxic agents -baseline creatinien 1.8-2.0   Chronic diarrhea -Unchanged in severity per family -stool pathogen panel   Essential (primary) hypertension -Blood pressure stable overall -resume dilitaizem   History of PE -pt is not longer on wafarin per pharmacy and Dr. Alonza Smoker office      Family Communication:   son updated 6/26  Consultants:  gen surgery  Code Status:  DNR  DVT Prophylaxis:  [place] Heparin    Procedures: As Listed in Progress Note Above  Antibiotics: None  RN Pressure Injury Documentation: Pressure Injury 10/21/22 Sacrum Left;Upper Unstageable - Full thickness tissue loss in which the base of the injury is covered by slough (yellow, tan, gray, green or brown) and/or eschar (tan, brown or black) in the wound bed. nonbleeding    1 cm in dept (Active)  10/21/22 2025  Location: Sacrum  Location Orientation: Left;Upper  Staging: Unstageable - Full thickness tissue loss in which the base of the injury is covered by slough (yellow, tan, gray, green or brown) and/or eschar (tan, brown or black) in the wound bed.  Wound Description (Comments): nonbleeding    1 cm in depth  3.5 cmx 5 cm  Present on Admission: Yes  Dressing Type Foam - Lift dressing to assess site every shift 10/22/22 2025  Subjective: Patient denies fevers, chills, headache, chest pain, dyspnea, nausea, vomiting, diarrhea, abdominal pain, dysuria, hematuria, hematochezia, and melena.   Objective: Vitals:   10/22/22 1431 10/22/22 1959 10/23/22 0517 10/23/22 1025  BP: (!) 146/64 (!) 157/65 (!) 158/58 (!) 179/63  Pulse: 100 (!) 101 84 93  Resp: 18 18 18 19    Temp: 98.4 F (36.9 C) 98.3 F (36.8 C) 98.1 F (36.7 C) 98.4 F (36.9 C)  TempSrc: Oral   Oral  SpO2: 97% 97% 99% 99%  Weight:      Height:        Intake/Output Summary (Last 24 hours) at 10/23/2022 1118 Last data filed at 10/23/2022 1039 Gross per 24 hour  Intake 1823.66 ml  Output 1650 ml  Net 173.66 ml   Weight change:  Exam:  General:  Pt is alert, follows commands appropriately, not in acute distress HEENT: No icterus, No thrush, No neck mass, [place]/AT Cardiovascular: RRR, S1/S2, no rubs, no gallops Respiratory: CTA bilaterally, no wheezing, no crackles, no rhonchi Abdomen: Soft/+BS, non tender, non distended, no guarding Extremities: No edema, No lymphangitis, No petechiae, No rashes, no synovitis   Data Reviewed: I have personally reviewed following labs and imaging studies Basic Metabolic Panel: Recent Labs  Lab 10/21/22 1245 10/22/22 0531 10/23/22 0534  NA 132* 135 134*  K 3.6 4.0 3.7  CL 92* 99 100  CO2 28 26 24   GLUCOSE 108* 91 99  BUN 48* 41* 31*  CREATININE 2.06* 1.90* 1.80*  CALCIUM 9.2 8.3* 8.2*   Liver Function Tests: Recent Labs  Lab 10/21/22 1245  AST 15  ALT 9  ALKPHOS 52  BILITOT 0.7  PROT 6.9  ALBUMIN 2.9*   No results for input(s): "LIPASE", "AMYLASE" in the last 168 hours. No results for input(s): "AMMONIA" in the last 168 hours. Coagulation Profile: Recent Labs  Lab 10/21/22 1245  INR 1.1   CBC: Recent Labs  Lab 10/21/22 1245 10/22/22 0531 10/23/22 0534  WBC 15.6* 13.2* 13.3*  NEUTROABS 11.8*  --   --   HGB 9.6* 8.1* 7.5*  HCT 30.0* 25.3* 24.2*  MCV 81.5 81.9 83.2  PLT 532* 491* 491*   Cardiac Enzymes: No results for input(s): "CKTOTAL", "CKMB", "CKMBINDEX", "TROPONINI" in the last 168 hours. BNP: Invalid input(s): "POCBNP" CBG: No results for input(s): "GLUCAP" in the last 168 hours. HbA1C: No results for input(s): "HGBA1C" in the last 72 hours. Urine analysis:    Component Value Date/Time   COLORURINE  YELLOW 10/21/2022 2200   APPEARANCEUR CLEAR 10/21/2022 2200   LABSPEC 1.012 10/21/2022 2200   PHURINE 7.0 10/21/2022 2200   GLUCOSEU NEGATIVE 10/21/2022 2200   HGBUR NEGATIVE 10/21/2022 2200   BILIRUBINUR NEGATIVE 10/21/2022 2200   KETONESUR NEGATIVE 10/21/2022 2200   PROTEINUR 30 (A) 10/21/2022 2200   UROBILINOGEN 0.2 08/16/2012 0843   NITRITE NEGATIVE 10/21/2022 2200   LEUKOCYTESUR LARGE (A) 10/21/2022 2200   Sepsis Labs: @LABRCNTIP (procalcitonin:4,lacticidven:4) ) Recent Results (from the past 240 hour(s))  Blood Culture (routine x 2)     Status: None (Preliminary result)   Collection Time: 10/21/22 12:40 PM   Specimen: BLOOD  Result Value Ref Range Status   Specimen Description BLOOD BLOOD LEFT ARM  Final   Special Requests   Final    Blood Culture adequate volume BOTTLES DRAWN AEROBIC AND ANAEROBIC   Culture   Final    NO GROWTH < 24 HOURS Performed at [facility], [address] [place], [place] [address]    Report Status  PENDING  Incomplete  Blood Culture (routine x 2)     Status: None (Preliminary result)   Collection Time: 10/21/22 12:45 PM   Specimen: BLOOD  Result Value Ref Range Status   Specimen Description BLOOD BLOOD RIGHT ARM  Final   Special Requests   Final    BOTTLES DRAWN AEROBIC AND ANAEROBIC Blood Culture results may not be optimal due to an excessive volume of blood received in culture bottles   Culture   Final    NO GROWTH < 24 HOURS Performed at [facility], [address] [place], [place] [address]    Report Status PENDING  Incomplete     Scheduled Meds:  heparin  5,000 Units Subcutaneous Q8H   leptospermum manuka honey  1 Application Topical Daily   Continuous Infusions:  ceFEPime (MAXIPIME) IV Stopped (10/22/22 1621)   lactated ringers 50 mL/hr at 10/23/22 0618   vancomycin      Procedures/Studies: MR PELVIS W WO CONTRAST  Result Date: 10/21/2022 CLINICAL DATA:  Sacral wound. EXAM: MRI PELVIS WITHOUT AND WITH CONTRAST TECHNIQUE:  Multiplanar multisequence MR imaging of the pelvis was performed both before and after administration of intravenous contrast. CONTRAST:  5mL GADAVIST GADOBUTROL 1 MMOL/ML IV SOLN COMPARISON:  CT abdomen pelvis dated August 16, 2012. FINDINGS: Urinary Tract:  No abnormality visualized. Bowel:  Sigmoid diverticulosis. Vascular/Lymphatic: No pathologically enlarged lymph nodes. No significant vascular abnormality seen. Reproductive:  Prior hysterectomy. Other: Chronic common bile duct dilatation status post cholecystectomy. Musculoskeletal: Sacral decubitus ulcer without underlying bony signal abnormality. Soft tissue enhancement around the decubitus ulcer without discrete fluid collection. Prior left hip arthroplasty. Small amount of fluid in the right greater trochanteric bursa. IMPRESSION: 1. Sacral decubitus ulcer without underlying osteomyelitis or discrete drainable abscess. Electronically Signed   By: Obie Dredge M.D.   On: 10/21/2022 16:29   DG Chest Port 1 View  Result Date: 10/21/2022 CLINICAL DATA:  Questionable sepsis. Weakness and diarrhea for 1 week. Patient is no longer able to ambulate with her walker. EXAM: PORTABLE CHEST 1 VIEW COMPARISON:  Two-view chest x-ray 06/05/2009 FINDINGS: The heart size is upper limits of normal. Atherosclerotic calcifications are present at the aortic arch. Changes of COPD are noted. No edema or effusion is present. No focal airspace disease is present. Asymmetric degenerative changes are present at the left shoulder. IMPRESSION: 1. No acute cardiopulmonary disease. 2. COPD. 3. Aortic atherosclerosis. Electronically Signed   By: Marin Roberts M.D.   On: 10/21/2022 13:23    Catarina Hartshorn, DO  [facility]  If 7PM-7AM, please contact night-coverage [email or web address] Password [email or web address] 10/23/2022, 11:18 AM   LOS: 2 days

## 2022-10-23 NOTE — Progress Notes (Signed)
Pain and discomfort reported in neck and legs this a.m. but pt stated she could not rate it. PRN Morphine given with some relief but pt stated she was still "uncomfortable." Pt repositioned. Dressing changed this a.m. SBP elevated, DBP low.

## 2022-10-23 NOTE — Consult Note (Signed)
Reason for Consult: Sacral decubitus ulcer Referring Physician: [name] is an 89 y.o. female.  HPI: Patient is a 89 year old white female who was brought into the emergency room with a complaint of generalized weakness and back pain.  She is able to assist with transfers but is mostly bedbound.  She was found to have a sacral decubitus ulcer at the time of admission.  Surgery has been asked to further assess and possibly debride the wound.  She is also had a wound care consult.  Past Medical History:  Diagnosis Date   DDD (degenerative disc disease), lumbar    DVT (deep venous thrombosis) (HCC)    Hip fracture (HCC)    left   Hypertension    Osteoporosis    PE (pulmonary embolism)    SBO (small bowel obstruction) (HCC) 2007   Sigmoid diverticulosis    Uterine cancer (HCC) 1993   s/p radiation therapy and hysterectomy    Past Surgical History:  Procedure Laterality Date   ABDOMINAL ADHESION SURGERY  2007   Dr. Jamey Ripa   ABDOMINAL HYSTERECTOMY  1993   APPENDECTOMY  2007   BOWEL RESECTION  2007   terminal ileum (Dr. Jamey Ripa)   CHOLECYSTECTOMY  2006   HIP SURGERY Left 2007   Total hip replacement    History reviewed. No pertinent family history.  Social History:  reports that she has never smoked. She has never been exposed to tobacco smoke. She has never used smokeless tobacco. She reports that she does not drink alcohol and does not use drugs.  Allergies: No Known Allergies  Medications: I have reviewed the patient's current medications.  Results for orders placed or performed during the hospital encounter of 10/21/22 (from the past 48 hour(s))  Blood Culture (routine x 2)     Status: None (Preliminary result)   Collection Time: 10/21/22 12:40 PM   Specimen: BLOOD  Result Value Ref Range   Specimen Description BLOOD BLOOD LEFT ARM    Special Requests      Blood Culture adequate volume BOTTLES DRAWN AEROBIC AND ANAEROBIC   Culture      NO GROWTH < 24  HOURS Performed at [facility], [address] [place], [place] [address]    Report Status PENDING   Lactic acid, plasma     Status: Abnormal   Collection Time: 10/21/22 12:45 PM  Result Value Ref Range   Lactic Acid, Venous 2.1 (HH) 0.5 - 1.9 mmol/L    Comment: CRITICAL RESULT CALLED TO, READ BACK BY AND VERIFIED WITH JESSICA NEWMAN @ 1321 ON 10/21/22 C VARNER Performed at [facility], [address] [place], [place] [address]   Comprehensive metabolic panel     Status: Abnormal   Collection Time: 10/21/22 12:45 PM  Result Value Ref Range   Sodium 132 (L) 135 - 145 mmol/L   Potassium 3.6 3.5 - 5.1 mmol/L   Chloride 92 (L) 98 - 111 mmol/L   CO2 28 22 - 32 mmol/L   Glucose, Bld 108 (H) 70 - 99 mg/dL    Comment: Glucose reference range applies only to samples taken after fasting for at least 8 hours.   BUN 48 (H) 8 - 23 mg/dL   Creatinine, Ser 8.29 (H) 0.44 - 1.00 mg/dL   Calcium 9.2 8.9 - 56.2 mg/dL   Total Protein 6.9 6.5 - 8.1 g/dL   Albumin 2.9 (L) 3.5 - 5.0 g/dL   AST 15 15 - 41 U/L   ALT 9  0 - 44 U/L   Alkaline Phosphatase 52 38 - 126 U/L   Total Bilirubin 0.7 0.3 - 1.2 mg/dL   GFR, Estimated 21 (L) >60 mL/min    Comment: (NOTE) Calculated using the CKD-EPI Creatinine Equation (2021)    Anion gap 12 5 - 15    Comment: Performed at [facility], [address] [place], [place] [address]  CBC with Differential     Status: Abnormal   Collection Time: 10/21/22 12:45 PM  Result Value Ref Range   WBC 15.6 (H) 4.0 - 10.5 K/uL   RBC 3.68 (L) 3.87 - 5.11 MIL/uL   Hemoglobin 9.6 (L) 12.0 - 15.0 g/dL   HCT 60.4 (L) 54.0 - 98.1 %   MCV 81.5 80.0 - 100.0 fL   MCH 26.1 26.0 - 34.0 pg   MCHC 32.0 30.0 - 36.0 g/dL   RDW 19.1 (H) 47.8 - 29.5 %   Platelets 532 (H) 150 - 400 K/uL   nRBC 0.0 0.0 - 0.2 %   Neutrophils Relative % 74 %   Neutro Abs 11.8 (H) 1.7 - 7.7 K/uL   Lymphocytes Relative 17 %   Lymphs Abs 2.6 0.7 - 4.0 K/uL   Monocytes Relative 7 %   Monocytes Absolute 1.1  (H) 0.1 - 1.0 K/uL   Eosinophils Relative 0 %   Eosinophils Absolute 0.1 0.0 - 0.5 K/uL   Basophils Relative 1 %   Basophils Absolute 0.1 0.0 - 0.1 K/uL   Immature Granulocytes 1 %   Abs Immature Granulocytes 0.08 (H) 0.00 - 0.07 K/uL    Comment: Performed at [facility], [address] [place], [place] [address]  Protime-INR     Status: None   Collection Time: 10/21/22 12:45 PM  Result Value Ref Range   Prothrombin Time 14.3 11.4 - 15.2 seconds   INR 1.1 0.8 - 1.2    Comment: (NOTE) INR goal varies based on device and disease states. Performed at [facility], [address] [place], [place] [address]   Blood Culture (routine x 2)     Status: None (Preliminary result)   Collection Time: 10/21/22 12:45 PM   Specimen: BLOOD  Result Value Ref Range   Specimen Description BLOOD BLOOD RIGHT ARM    Special Requests      BOTTLES DRAWN AEROBIC AND ANAEROBIC Blood Culture results may not be optimal due to an excessive volume of blood received in culture bottles   Culture      NO GROWTH < 24 HOURS Performed at [facility], [address] [place], [place] [address]    Report Status PENDING   Troponin I (High Sensitivity)     Status: Abnormal   Collection Time: 10/21/22 12:45 PM  Result Value Ref Range   Troponin I (High Sensitivity) 19 (H) <18 ng/L    Comment: (NOTE) Elevated high sensitivity troponin I (hsTnI) values and significant  changes across serial measurements may suggest ACS but many other  chronic and acute conditions are known to elevate hsTnI results.  Refer to the "Links" section for chest pain algorithms and additional  guidance. Performed at [facility], [address], [place], [place] [address]   Brain natriuretic peptide     Status: Abnormal   Collection Time: 10/21/22 12:52 PM  Result Value Ref Range   B Natriuretic Peptide 195.0 (H) 0.0 - 100.0 pg/mL    Comment: Performed at [facility], [address], [place], [place] [address]  Lactic acid, plasma  Status: None   Collection Time: 10/21/22  2:58 PM  Result Value Ref Range   Lactic Acid, Venous 1.1 0.5 - 1.9 mmol/L    Comment: Performed at [facility], [address] [place], [place] [address]  Troponin I (High Sensitivity)     Status: None   Collection Time: 10/21/22  2:58 PM  Result Value Ref Range   Troponin I (High Sensitivity) 17 <18 ng/L    Comment: (NOTE) Elevated high sensitivity troponin I (hsTnI) values and significant  changes across serial measurements may suggest ACS but many other  chronic and acute conditions are known to elevate hsTnI results.  Refer to the "Links" section for chest pain algorithms and additional  guidance. Performed at [facility], [address], [place], [place] [address]   Urinalysis, w/ Reflex to Culture (Infection Suspected) -Urine, Clean Catch     Status: Abnormal   Collection Time: 10/21/22 10:00 PM  Result Value Ref Range   Specimen Source URINE, CLEAN CATCH    Color, Urine YELLOW YELLOW   APPearance CLEAR CLEAR   Specific Gravity, Urine 1.012 1.005 - 1.030   pH 7.0 5.0 - 8.0   Glucose, UA NEGATIVE NEGATIVE mg/dL   Hgb urine dipstick NEGATIVE NEGATIVE   Bilirubin Urine NEGATIVE NEGATIVE   Ketones, ur NEGATIVE NEGATIVE mg/dL   Protein, ur 30 (A) NEGATIVE mg/dL   Nitrite NEGATIVE NEGATIVE   Leukocytes,Ua LARGE (A) NEGATIVE   RBC / HPF 0-5 0 - 5 RBC/hpf   WBC, UA 0-5 0 - 5 WBC/hpf    Comment:        Reflex urine culture not performed if WBC <=10, OR if Squamous epithelial cells >5. If Squamous epithelial cells >5 suggest recollection.    Bacteria, UA RARE (A) NONE SEEN   Squamous Epithelial / HPF 0-5 0 - 5 /HPF   Mucus PRESENT     Comment: Performed at [facility], [address], [place], [place] [address]  Basic metabolic panel     Status: Abnormal   Collection Time: 10/22/22  5:31 AM  Result Value Ref Range   Sodium 135 135 - 145 mmol/L   Potassium 4.0 3.5 - 5.1 mmol/L   Chloride 99 98 - 111 mmol/L   CO2 26 22 - 32  mmol/L   Glucose, Bld 91 70 - 99 mg/dL    Comment: Glucose reference range applies only to samples taken after fasting for at least 8 hours.   BUN 41 (H) 8 - 23 mg/dL   Creatinine, Ser 4.13 (H) 0.44 - 1.00 mg/dL   Calcium 8.3 (L) 8.9 - 10.3 mg/dL   GFR, Estimated 23 (L) >60 mL/min    Comment: (NOTE) Calculated using the CKD-EPI Creatinine Equation (2021)    Anion gap 10 5 - 15    Comment: Performed at [facility], [address] [place], [place] [address]  CBC     Status: Abnormal   Collection Time: 10/22/22  5:31 AM  Result Value Ref Range   WBC 13.2 (H) 4.0 - 10.5 K/uL   RBC 3.09 (L) 3.87 - 5.11 MIL/uL   Hemoglobin 8.1 (L) 12.0 - 15.0 g/dL   HCT 02.7 (L) 25.3 - 66.4 %   MCV 81.9 80.0 - 100.0 fL   MCH 26.2 26.0 - 34.0 pg   MCHC 32.0 30.0 - 36.0 g/dL   RDW 40.3 (H) 47.4 - 25.9 %   Platelets 491 (H) 150 - 400 K/uL   nRBC 0.0 0.0 - 0.2 %  Comment: Performed at [facility], [address] [place], [place] [address]  CBC     Status: Abnormal   Collection Time: 10/23/22  5:34 AM  Result Value Ref Range   WBC 13.3 (H) 4.0 - 10.5 K/uL   RBC 2.91 (L) 3.87 - 5.11 MIL/uL   Hemoglobin 7.5 (L) 12.0 - 15.0 g/dL   HCT 60.4 (L) 54.0 - 98.1 %   MCV 83.2 80.0 - 100.0 fL   MCH 25.8 (L) 26.0 - 34.0 pg   MCHC 31.0 30.0 - 36.0 g/dL   RDW 19.1 (H) 47.8 - 29.5 %   Platelets 491 (H) 150 - 400 K/uL   nRBC 0.0 0.0 - 0.2 %    Comment: Performed at [facility], [address] [place], [place] [address]  Basic metabolic panel     Status: Abnormal   Collection Time: 10/23/22  5:34 AM  Result Value Ref Range   Sodium 134 (L) 135 - 145 mmol/L   Potassium 3.7 3.5 - 5.1 mmol/L   Chloride 100 98 - 111 mmol/L   CO2 24 22 - 32 mmol/L   Glucose, Bld 99 70 - 99 mg/dL    Comment: Glucose reference range applies only to samples taken after fasting for at least 8 hours.   BUN 31 (H) 8 - 23 mg/dL   Creatinine, Ser 8.65 (H) 0.44 - 1.00 mg/dL   Calcium 8.2 (L) 8.9 - 10.3 mg/dL   GFR, Estimated 25 (L) >60  mL/min    Comment: (NOTE) Calculated using the CKD-EPI Creatinine Equation (2021)    Anion gap 10 5 - 15    Comment: Performed at [facility], [address] [place], [place] [address]    MR PELVIS W WO CONTRAST  Result Date: 10/21/2022 CLINICAL DATA:  Sacral wound. EXAM: MRI PELVIS WITHOUT AND WITH CONTRAST TECHNIQUE: Multiplanar multisequence MR imaging of the pelvis was performed both before and after administration of intravenous contrast. CONTRAST:  5mL GADAVIST GADOBUTROL 1 MMOL/ML IV SOLN COMPARISON:  CT abdomen pelvis dated August 16, 2012. FINDINGS: Urinary Tract:  No abnormality visualized. Bowel:  Sigmoid diverticulosis. Vascular/Lymphatic: No pathologically enlarged lymph nodes. No significant vascular abnormality seen. Reproductive:  Prior hysterectomy. Other: Chronic common bile duct dilatation status post cholecystectomy. Musculoskeletal: Sacral decubitus ulcer without underlying bony signal abnormality. Soft tissue enhancement around the decubitus ulcer without discrete fluid collection. Prior left hip arthroplasty. Small amount of fluid in the right greater trochanteric bursa. IMPRESSION: 1. Sacral decubitus ulcer without underlying osteomyelitis or discrete drainable abscess. Electronically Signed   By: Obie Dredge M.D.   On: 10/21/2022 16:29   DG Chest Port 1 View  Result Date: 10/21/2022 CLINICAL DATA:  Questionable sepsis. Weakness and diarrhea for 1 week. Patient is no longer able to ambulate with her walker. EXAM: PORTABLE CHEST 1 VIEW COMPARISON:  Two-view chest x-ray 06/05/2009 FINDINGS: The heart size is upper limits of normal. Atherosclerotic calcifications are present at the aortic arch. Changes of COPD are noted. No edema or effusion is present. No focal airspace disease is present. Asymmetric degenerative changes are present at the left shoulder. IMPRESSION: 1. No acute cardiopulmonary disease. 2. COPD. 3. Aortic atherosclerosis. Electronically Signed   By: Marin Roberts M.D.   On: 10/21/2022 13:23    ROS:  Pertinent items are noted in HPI.  Blood pressure (!) 179/63, pulse 93, temperature 98.4 F (36.9 C), temperature source Oral, resp. rate 19, height 5\' 2"  (1.575 m), weight 45.4 kg, SpO2 99 %. Physical  Exam: Pleasant white female who is hard in hearing, but in no acute distress.  Patient is somewhat cachectic in nature. Head is normocephalic, atraumatic.  Skin examination: Please see picture on chart.  The soft eschar is now almost fully resolved with the current treatment.  I did with scissors sharply debrided the remaining soft tissue eschar.  This appears to be a stage III sacral decubitus ulcer.  Assessment/Plan: Impression: Stage III sacral decubitus ulcer Plan: Continue wound care.  Will follow peripherally with you.  [name] 10/23/2022, 10:34 AM

## 2022-10-23 NOTE — [place] FL2 (Signed)
[place] MEDICAID FL2 LEVEL OF CARE FORM     IDENTIFICATION  Patient Name: [name] Birthdate: 1918/05/03 Sex: female Admission Date (Current Location): 10/21/2022  [facility] and [place] Number:  [employer] and Address:  [facility],  [address] [place] [address]      Provider Number: [id number]  Attending Physician Name and Address:  Catarina Hartshorn, MD  Relative Name and Phone Number:  [place], Tortorelli) [phone]    Current Level of Care: Hospital Recommended Level of Care: Skilled Nursing Facility Prior Approval Number:    Date Approved/Denied:   PASRR Number: [id number]  Discharge Plan: SNF    Current Diagnoses: Patient Active Problem List   Diagnosis Date Noted   Sacral wound 10/23/2022   Infected decubitus ulcer 10/21/2022   CKD (chronic kidney disease) stage 4, GFR 15-29 ml/min (HCC) 10/21/2022   Chronic diarrhea 10/21/2022   Right rotator cuff tear 01/04/2013   Pain in joint, shoulder region 01/04/2013   Muscle weakness (generalized) 01/04/2013   Abdominal pain 12/14/2012   Blood in the urine 12/14/2012   Buzzing in ear 12/14/2012   Calculus of kidney 12/14/2012   Essential (primary) hypertension 12/14/2012   Pulmonary embolism (HCC) 12/14/2012   PNA (pneumonia) 12/14/2012    Orientation RESPIRATION BLADDER Height & Weight     Self, Time, Situation, Place  Normal Continent Weight: 100 lb 1.4 oz (45.4 kg) Height:  5\' 2"  (157.5 cm)  BEHAVIORAL SYMPTOMS/MOOD NEUROLOGICAL BOWEL NUTRITION STATUS      Continent Diet (heart healthy)  AMBULATORY STATUS COMMUNICATION OF NEEDS Skin   Total Care Verbally PU Stage and Appropriate Care (sacrum: unstagable)                       Personal Care Assistance Level of Assistance  Feeding, Dressing, Bathing Bathing Assistance: Maximum assistance Feeding assistance: Independent Dressing Assistance: Maximum assistance     Functional Limitations Info  Sight, Hearing, Speech  Sight Info: Adequate Hearing Info: Adequate Speech Info: Adequate    SPECIAL CARE FACTORS FREQUENCY  PT (By licensed PT)     PT Frequency: 5x/week              Contractures Contractures Info: Not present    Additional Factors Info  Code Status, Allergies Code Status Info: DNR Allergies Info: NKA           Current Medications (10/23/2022):  This is the current hospital active medication list Current Facility-Administered Medications  Medication Dose Route Frequency Provider Last Rate Last Admin   acetaminophen (TYLENOL) tablet 650 mg  650 mg Oral Q6H PRN Emokpae, Ejiroghene E, MD       Or   acetaminophen (TYLENOL) suppository 650 mg  650 mg Rectal Q6H PRN Emokpae, Ejiroghene E, MD       ceFEPIme (MAXIPIME) 1 g in sodium chloride 0.9 % 100 mL IVPB  1 g Intravenous Q24H Emokpae, Ejiroghene E, MD   Paused at 10/22/22 1621   diltiazem (CARDIZEM CD) 24 hr capsule 120 mg  120 mg Oral Daily Tat, David, MD       heparin injection 5,000 Units  5,000 Units Subcutaneous Q8H Emokpae, Ejiroghene E, MD   5,000 Units at 10/23/22 0501   leptospermum manuka honey (MEDIHONEY) paste 1 Application  1 Application Topical Daily Vassie Loll, MD   1 Application at 10/23/22 1029   ondansetron (ZOFRAN) tablet 4 mg  4 mg Oral Q6H PRN Emokpae, Heloise Beecham, MD  Or   ondansetron (ZOFRAN) injection 4 mg  4 mg Intravenous Q6H PRN Emokpae, Ejiroghene E, MD       polyethylene glycol (MIRALAX / GLYCOLAX) packet 17 g  17 g Oral Daily PRN Emokpae, Ejiroghene E, MD       vancomycin (VANCOREADY) IVPB 500 mg/100 mL  500 mg Intravenous Q48H Emokpae, Ejiroghene E, MD         Discharge Medications: Please see discharge summary for a list of discharge medications.  Relevant Imaging Results:  Relevant Lab Results:   Additional Information SSN [id number] Juleen China, LCSW

## 2022-10-23 NOTE — Plan of Care (Signed)
  Problem: Acute Rehab PT Goals(only PT should resolve) Goal: Pt Will Go Supine/Side To Sit Outcome: Progressing Flowsheets (Taken 10/23/2022 1533) Pt will go Supine/Side to Sit: with moderate assist Goal: Patient Will Transfer Sit To/From Stand Outcome: Progressing Flowsheets (Taken 10/23/2022 1533) Patient will transfer sit to/from stand:  with minimal assist  with moderate assist Goal: Pt Will Transfer Bed To Chair/Chair To Bed Outcome: Progressing Flowsheets (Taken 10/23/2022 1533) Pt will Transfer Bed to Chair/Chair to Bed:  with min assist  with mod assist Goal: Pt Will Ambulate Outcome: Progressing Flowsheets (Taken 10/23/2022 1533) Pt will Ambulate:  15 feet  with moderate assist  with rolling walker   3:34 PM, 10/23/22 Ocie Bob, MPT Physical Therapist with [facility] [phone] office [phone] mobile phone

## 2022-10-23 NOTE — TOC Transition Note (Signed)
Transition of Care [facility]) - CM/SW Discharge Note   Patient Details  Name: [name] MRN: [id number] Date of Birth: [birth year]  Transition of [facility]) CM/SW Contact:  Annice Needy, LCSW Phone Number: 10/23/2022, 11:08 AM   Clinical Narrative:    Patient from home alone. Admitted for infected decubitus ulcer. Has caregivers during the day. Son, [name], lives next door. Another son lives down the street. Her children check on her frequently throughout the day. Patient has been confined to a lift chair for years. PTA they were able to lift patient with the lift chair and she would use a BSC. [name] says the brought patient to the hospital because she could no longer stand and use the [facility] She had to have two people to lift her from the lifted lift chair and physically turn her around. [name] states that patient will have surgery on her decubitus ulcer and he feels that she needs SNF for rehab and wound care.  Referral made to facilities of choice and authorization started.      Barriers to Discharge: Continued Medical Work up   Patient Goals and CMS Choice      Discharge Placement                         Discharge Plan and Services Additional resources added to the After Visit Summary for       Post Acute Care Choice: Skilled Nursing Facility                               Social Determinants of Health (SDOH) Interventions SDOH Screenings   Food Insecurity: No Food Insecurity (09/17/2022)  Housing: Low Risk  (09/17/2022)  Transportation Needs: No Transportation Needs (09/17/2022)  Utilities: Not At Risk (09/17/2022)  Alcohol Screen: Low Risk  (09/17/2022)  Depression (PHQ2-9): Low Risk  (09/17/2022)  Financial Resource Strain: Low Risk  (09/17/2022)  Physical Activity: Insufficiently Active (09/17/2022)  Social Connections: Moderately Integrated (09/17/2022)  Stress: No Stress Concern Present (09/17/2022)  Tobacco Use: Low Risk  (10/21/2022)     Readmission  Risk Interventions     No data to display

## 2022-10-24 DIAGNOSIS — N184 Chronic kidney disease, stage 4 (severe): Secondary | ICD-10-CM | POA: Diagnosis not present

## 2022-10-24 DIAGNOSIS — A0472 Enterocolitis due to Clostridium difficile, not specified as recurrent: Secondary | ICD-10-CM | POA: Diagnosis not present

## 2022-10-24 DIAGNOSIS — L8993 Pressure ulcer of unspecified site, stage 3: Secondary | ICD-10-CM | POA: Diagnosis not present

## 2022-10-24 DIAGNOSIS — R531 Weakness: Secondary | ICD-10-CM | POA: Diagnosis not present

## 2022-10-24 LAB — GASTROINTESTINAL PANEL BY PCR, STOOL (REPLACES STOOL CULTURE)

## 2022-10-24 LAB — CBC
HCT: 22.6 % — ABNORMAL LOW (ref 36.0–46.0)
Hemoglobin: 7.3 g/dL — ABNORMAL LOW (ref 12.0–15.0)
MCH: 26.5 pg (ref 26.0–34.0)
MCHC: 32.3 g/dL (ref 30.0–36.0)
MCV: 82.2 fL (ref 80.0–100.0)
Platelets: 489 10*3/uL — ABNORMAL HIGH (ref 150–400)
RBC: 2.75 MIL/uL — ABNORMAL LOW (ref 3.87–5.11)
RDW: 17.2 % — ABNORMAL HIGH (ref 11.5–15.5)
WBC: 11.4 10*3/uL — ABNORMAL HIGH (ref 4.0–10.5)
nRBC: 0 % (ref 0.0–0.2)

## 2022-10-24 LAB — BASIC METABOLIC PANEL
Anion gap: 10 (ref 5–15)
BUN: 27 mg/dL — ABNORMAL HIGH (ref 8–23)
CO2: 23 mmol/L (ref 22–32)
Calcium: 8.1 mg/dL — ABNORMAL LOW (ref 8.9–10.3)
Chloride: 102 mmol/L (ref 98–111)
Creatinine, Ser: 1.69 mg/dL — ABNORMAL HIGH (ref 0.44–1.00)
GFR, Estimated: 26 mL/min — ABNORMAL LOW (ref >60)
Glucose, Bld: 97 mg/dL (ref 70–99)
Potassium: 3.3 mmol/L — ABNORMAL LOW (ref 3.5–5.1)
Sodium: 135 mmol/L (ref 135–145)

## 2022-10-24 LAB — CULTURE, BLOOD (ROUTINE X 2)

## 2022-10-24 LAB — CLOSTRIDIUM DIFFICILE BY PCR, REFLEXED: Toxigenic C. Difficile by PCR: POSITIVE — AB

## 2022-10-24 LAB — MAGNESIUM: Magnesium: 1.5 mg/dL — ABNORMAL LOW (ref 1.7–2.4)

## 2022-10-24 MED ORDER — ORAL CARE MOUTH RINSE: 15.0000 mL | OROMUCOSAL | Status: DC | PRN

## 2022-10-24 MED ORDER — VANCOMYCIN HCL 125 MG PO CAPS
125.0000 mg | ORAL_CAPSULE | Freq: Four times a day (QID) | ORAL | Status: DC
  Administered 2022-10-24 – 2022-10-25 (×6): 125 mg via ORAL
  Filled 2022-10-24 (×18): qty 1

## 2022-10-24 MED ORDER — SODIUM CHLORIDE 0.9 % IV SOLN
2.0000 g | INTRAVENOUS | Status: DC
  Administered 2022-10-24: 2 g via INTRAVENOUS
  Filled 2022-10-24: qty 20

## 2022-10-24 MED ORDER — POTASSIUM CHLORIDE CRYS ER 20 MEQ PO TBCR: 20.0000 meq | EXTENDED_RELEASE_TABLET | Freq: Once | ORAL | Status: DC

## 2022-10-24 MED ORDER — POTASSIUM CHLORIDE CRYS ER 20 MEQ PO TBCR
20.0000 meq | EXTENDED_RELEASE_TABLET | Freq: Once | ORAL | Status: AC
  Administered 2022-10-24: 20 meq via ORAL
  Filled 2022-10-24: qty 1

## 2022-10-24 MED ORDER — MAGNESIUM SULFATE IN D5W 1-5 GM/100ML-% IV SOLN
1.0000 g | Freq: Once | INTRAVENOUS | Status: AC
  Administered 2022-10-24: 1 g via INTRAVENOUS
  Filled 2022-10-24: qty 100

## 2022-10-24 MED ORDER — MAGNESIUM SULFATE 2 GM/50ML IV SOLN: 2.0000 g | Freq: Once | INTRAVENOUS | Status: DC

## 2022-10-24 NOTE — Progress Notes (Addendum)
PROGRESS NOTE  [name] HYQ:[id number] DOB: 03/05/1919 DOA: 10/21/2022 PCP: Carylon Perches, MD  Brief History:  As per H&P written by Dr. Mariea Clonts on 10/21/2022 [name] is a 89 y.o. female with medical history significant for hypertension, pulmonary embolism, small bowel obstruction. Patient is brought to the ED with complaints of generalized weakness, and pain to her back.  Patient is mostly bedbound at baseline, but she is able to assist with transfers.  She has a sacral decubitus wound, daughter at bedside - June, states the wound started 2 weeks ago.  She also has adhered to her right forearm over the past week.  Patient has some bilateral lower extremity swelling, so patient son increased the dose of her Lasix from 20 to 60 mg over the past 3 days.  Since then patient has become generally more weak.  Patient has a caregiver, but her Son, [name] who is his HCPOA lives close by and checks on her frequently. Patient has chronic diarrhea that is unchanged. Patient is hard of hearing, and is barely able to see, but per daughter has no memory deficits whatsoever.  She is able to answer questions.  She reports some tingling feeling with urination.  Otherwise she denies chest pain or difficulty breathing.   ED Course: Temperature 97.8.  Heart rate 70s to 80s, respiratory rate 13-26.  Blood pressure systolic 105-170.  O2 sats greater than 92% on room air. Leukocytosis of 15.6.  Lactic acidosis of 2.1 > 1.1.  Troponin 19> 17.  BNP 195.  Port chest x-ray negative for acute abnormality. MRI- pelvis with and without contrast-shows sacral decubitus ulcer without underlying osteomyelitis or discrete drainable abscess.  Assessment/Plan:   Infected decubitus ulcer--unstageable -Unstageable sacral wound appreciated on exam; erythematous changes surrounding wound and follow recommendation by wound care service - -appreciate surgery consult and bedside debridement -I discussed with Dr.  Essie Christine tunneling, no pus -continue IV abx another 24 hours>>transition to po abx 6/28 -Follow WBCs, culture results and clinical response.   -MRI negative presence of osteomyelitis/abscesses.   CKD (chronic kidney disease) stage 4, GFR 15-29 ml/min (HCC) -Chronic kidney disease stage IV at baseline -Continue to maintain adequate hydration and minimizing nephrotoxic agents -baseline creatinien 1.8-2.0   Chronic diarrhea>>Cdiff colitis -Cdiff antigen positive, Cdiff PCR positive -stool pathogen panel+norovirus   Essential (primary) hypertension -Blood pressure stable overall -resume dilitaizem   History of PE -pt is not longer on wafarin per pharmacy and Dr. Alonza Smoker office   Hypokalemia/Hypomagnesemia -replete       Family Communication:   daughter updated 6/27   Consultants:  gen surgery   Code Status:  DNR   DVT Prophylaxis:  [place] Heparin      Procedures: As Listed in Progress Note Above   Antibiotics: Vanc 6/24>> Cefpime 6/24>>6/26 Ceftriaxone 6/27>>      Subjective: Patient denies fevers, chills, headache, chest pain, dyspnea, nausea, vomiting, abdominal pain, dysuria, hematuria, hematochezia, and melena. 4 documented loose stools in last 24 hours  Objective: Vitals:   10/23/22 0517 10/23/22 1025 10/23/22 2115 10/24/22 1005  BP: (!) 158/58 (!) 179/63 (!) 169/52 (!) 133/45  Pulse: 84 93 81 75  Resp: 18 19 18    Temp: 98.1 F (36.7 C) 98.4 F (36.9 C) 98.3 F (36.8 C)   TempSrc:  Oral Oral   SpO2: 99% 99% 100%   Weight:      Height:        Intake/Output Summary (  Last 24 hours) at 10/24/2022 1304 Last data filed at 10/24/2022 0817 Gross per 24 hour  Intake 1324.95 ml  Output 1300 ml  Net 24.95 ml   Weight change:  Exam:  General:  Pt is alert, follows commands appropriately, not in acute distress HEENT: No icterus, No thrush, No neck mass, [place]/AT Cardiovascular: RRR, S1/S2, no rubs, no gallops Respiratory: CTA bilaterally, no  wheezing, no crackles, no rhonchi Abdomen: Soft/+BS, non tender, non distended, no guarding Extremities: No edema, No lymphangitis, No petechiae, No rashes, no synovitis   Data Reviewed: I have personally reviewed following labs and imaging studies Basic Metabolic Panel: Recent Labs  Lab 10/21/22 1245 10/22/22 0531 10/23/22 0534 10/24/22 0526  NA 132* 135 134* 135  K 3.6 4.0 3.7 3.3*  CL 92* 99 100 102  CO2 28 26 24 23   GLUCOSE 108* 91 99 97  BUN 48* 41* 31* 27*  CREATININE 2.06* 1.90* 1.80* 1.69*  CALCIUM 9.2 8.3* 8.2* 8.1*  MG  --   --  1.6* 1.5*   Liver Function Tests: Recent Labs  Lab 10/21/22 1245  AST 15  ALT 9  ALKPHOS 52  BILITOT 0.7  PROT 6.9  ALBUMIN 2.9*   No results for input(s): "LIPASE", "AMYLASE" in the last 168 hours. No results for input(s): "AMMONIA" in the last 168 hours. Coagulation Profile: Recent Labs  Lab 10/21/22 1245  INR 1.1   CBC: Recent Labs  Lab 10/21/22 1245 10/22/22 0531 10/23/22 0534 10/24/22 0526  WBC 15.6* 13.2* 13.3* 11.4*  NEUTROABS 11.8*  --   --   --   HGB 9.6* 8.1* 7.5* 7.3*  HCT 30.0* 25.3* 24.2* 22.6*  MCV 81.5 81.9 83.2 82.2  PLT 532* 491* 491* 489*   Cardiac Enzymes: Recent Labs  Lab 10/23/22 0534  CKTOTAL 19*   BNP: Invalid input(s): "POCBNP" CBG: No results for input(s): "GLUCAP" in the last 168 hours. HbA1C: No results for input(s): "HGBA1C" in the last 72 hours. Urine analysis:    Component Value Date/Time   COLORURINE YELLOW 10/21/2022 2200   APPEARANCEUR CLEAR 10/21/2022 2200   LABSPEC 1.012 10/21/2022 2200   PHURINE 7.0 10/21/2022 2200   GLUCOSEU NEGATIVE 10/21/2022 2200   HGBUR NEGATIVE 10/21/2022 2200   BILIRUBINUR NEGATIVE 10/21/2022 2200   KETONESUR NEGATIVE 10/21/2022 2200   PROTEINUR 30 (A) 10/21/2022 2200   UROBILINOGEN 0.2 08/16/2012 0843   NITRITE NEGATIVE 10/21/2022 2200   LEUKOCYTESUR LARGE (A) 10/21/2022 2200   Sepsis  Labs: @LABRCNTIP (procalcitonin:4,lacticidven:4) ) Recent Results (from the past 240 hour(s))  Blood Culture (routine x 2)     Status: None (Preliminary result)   Collection Time: 10/21/22 12:40 PM   Specimen: BLOOD  Result Value Ref Range Status   Specimen Description BLOOD BLOOD LEFT ARM  Final   Special Requests   Final    Blood Culture adequate volume BOTTLES DRAWN AEROBIC AND ANAEROBIC   Culture   Final    NO GROWTH 3 DAYS Performed at [facility], [address], [place], [place] [address]    Report Status PENDING  Incomplete  Blood Culture (routine x 2)     Status: None (Preliminary result)   Collection Time: 10/21/22 12:45 PM   Specimen: BLOOD  Result Value Ref Range Status   Specimen Description BLOOD BLOOD RIGHT ARM  Final   Special Requests   Final    BOTTLES DRAWN AEROBIC AND ANAEROBIC Blood Culture results may not be optimal due to an excessive volume of blood received in culture  bottles   Culture   Final    NO GROWTH 3 DAYS Performed at [facility], [address] [place], [place] [address]    Report Status PENDING  Incomplete  C Difficile Quick Screen w PCR reflex     Status: Abnormal   Collection Time: 10/23/22  6:25 PM   Specimen: STOOL  Result Value Ref Range Status   C Diff antigen POSITIVE (A) NEGATIVE Final   C Diff toxin NEGATIVE NEGATIVE Final   C Diff interpretation Results are indeterminate. See PCR results.  Final    Comment: Performed at [facility], [address], [place], [place] [address]  Gastrointestinal Panel by PCR , Stool     Status: Abnormal   Collection Time: 10/23/22  6:25 PM   Specimen: Stool  Result Value Ref Range Status   Campylobacter species NOT DETECTED NOT DETECTED Final   Plesimonas shigelloides NOT DETECTED NOT DETECTED Final   Salmonella species NOT DETECTED NOT DETECTED Final   Yersinia enterocolitica NOT DETECTED NOT DETECTED Final   Vibrio species NOT DETECTED NOT DETECTED Final   Vibrio cholerae NOT DETECTED NOT  DETECTED Final   Enteroaggregative E coli (EAEC) NOT DETECTED NOT DETECTED Final   Enteropathogenic E coli (EPEC) NOT DETECTED NOT DETECTED Final   Enterotoxigenic E coli (ETEC) NOT DETECTED NOT DETECTED Final   Shiga like toxin producing E coli (STEC) NOT DETECTED NOT DETECTED Final   Shigella/Enteroinvasive E coli (EIEC) NOT DETECTED NOT DETECTED Final   Cryptosporidium NOT DETECTED NOT DETECTED Final   Cyclospora cayetanensis NOT DETECTED NOT DETECTED Final   Entamoeba histolytica NOT DETECTED NOT DETECTED Final   Giardia lamblia NOT DETECTED NOT DETECTED Final   Adenovirus F40/41 NOT DETECTED NOT DETECTED Final   Astrovirus NOT DETECTED NOT DETECTED Final   Norovirus GI/GII DETECTED (A) NOT DETECTED Final    Comment: CRITICAL RESULT CALLED TO, READ BACK BY AND VERIFIED WITH:  C/ PATRICIA MILES AT 955 10/24/22 JG    Rotavirus A NOT DETECTED NOT DETECTED Final   Sapovirus (I, II, IV, and V) NOT DETECTED NOT DETECTED Final    Comment: Performed at [facility], [address] [place], [place] [address]  C. Diff by PCR, Reflexed     Status: Abnormal   Collection Time: 10/23/22  6:25 PM  Result Value Ref Range Status   Toxigenic C. Difficile by PCR POSITIVE (A) NEGATIVE Final    Comment: Positive for toxigenic C. difficile with little to no toxin production. Only treat if clinical presentation suggests symptomatic illness. Performed at [facility], [address], [place], [place] [address]      Scheduled Meds:  diltiazem  120 mg Oral Daily   heparin  5,000 Units Subcutaneous Q8H   leptospermum manuka honey  1 Application Topical Daily   Continuous Infusions:  ceFEPime (MAXIPIME) IV Stopped (10/23/22 1646)   vancomycin Stopped (10/23/22 1801)    Procedures/Studies: MR PELVIS W WO CONTRAST  Result Date: 10/21/2022 CLINICAL DATA:  Sacral wound. EXAM: MRI PELVIS WITHOUT AND WITH CONTRAST TECHNIQUE: Multiplanar multisequence MR imaging of the pelvis was performed  both before and after administration of intravenous contrast. CONTRAST:  5mL GADAVIST GADOBUTROL 1 MMOL/ML IV SOLN COMPARISON:  CT abdomen pelvis dated August 16, 2012. FINDINGS: Urinary Tract:  No abnormality visualized. Bowel:  Sigmoid diverticulosis. Vascular/Lymphatic: No pathologically enlarged lymph nodes. No significant vascular abnormality seen. Reproductive:  Prior hysterectomy. Other: Chronic common bile duct dilatation status post cholecystectomy. Musculoskeletal: Sacral decubitus ulcer without  underlying bony signal abnormality. Soft tissue enhancement around the decubitus ulcer without discrete fluid collection. Prior left hip arthroplasty. Small amount of fluid in the right greater trochanteric bursa. IMPRESSION: 1. Sacral decubitus ulcer without underlying osteomyelitis or discrete drainable abscess. Electronically Signed   By: Obie Dredge M.D.   On: 10/21/2022 16:29   DG Chest Port 1 View  Result Date: 10/21/2022 CLINICAL DATA:  Questionable sepsis. Weakness and diarrhea for 1 week. Patient is no longer able to ambulate with her walker. EXAM: PORTABLE CHEST 1 VIEW COMPARISON:  Two-view chest x-ray 06/05/2009 FINDINGS: The heart size is upper limits of normal. Atherosclerotic calcifications are present at the aortic arch. Changes of COPD are noted. No edema or effusion is present. No focal airspace disease is present. Asymmetric degenerative changes are present at the left shoulder. IMPRESSION: 1. No acute cardiopulmonary disease. 2. COPD. 3. Aortic atherosclerosis. Electronically Signed   By: Marin Roberts M.D.   On: 10/21/2022 13:23    Catarina Hartshorn, DO  [facility]  If 7PM-7AM, please contact night-coverage [email or web address] Password [email or web address] 10/24/2022, 1:04 PM   LOS: 3 days

## 2022-10-24 NOTE — Progress Notes (Signed)
Physical Therapy Treatment Patient Details Name: [name] MRN: [id number] DOB: [birth year] Today's Date: 10/24/2022   History of Present Illness [name] is a 89 y.o. female with medical history significant for hypertension, pulmonary embolism, small bowel obstruction.  Patient is brought to the ED with complaints of generalized weakness, and pain to her back.  Patient is mostly bedbound at baseline, but she is able to assist with transfers.  She has a sacral decubitus wound, daughter at bedside - June, states the wound started 2 weeks ago.  She also has adhered to her right forearm over the past week.  Patient has some bilateral lower extremity swelling, so patient son increased the dose of her Lasix from 20 to 60 mg over the past 3 days.  Since then patient has become generally more weak.  Patient has a caregiver, but her Son, [name] who is his HCPOA lives close by and checks on her frequently.  Patient has chronic diarrhea that is unchanged.  Patient is hard of hearing, and is barely able to see, but per daughter has no memory deficits whatsoever.  He is able to answer questions.  She reports some tingling feeling with urination.  Otherwise she denies chest pain or difficulty breathing.    PT Comments    Patient agreeable for therapy and requires frequent verbal/tactile cueing mostly due to very HOH.  Patient demonstrates slow labored movement for rolling to side and sitting up from side lying position, once seated frequent posterior leaning, but able to self correct when instructed to hold onto knees with her hands with fair good carryover.  Patient able to take a few side steps having to hike hips to clear toes due to baseline bilateral ankle foot drop, once seated demonstrated fair/good return for completing hip raises and LAQ's and tolerated staying up in chair - nurse aware.  Patient will benefit from continued skilled physical therapy in hospital and recommended venue below to increase  strength, balance, endurance for safe ADLs and gait.     Recommendations for follow up therapy are one component of a multi-disciplinary discharge planning process, led by the attending physician.  Recommendations may be updated based on patient status, additional functional criteria and insurance authorization.  Follow Up Recommendations  Can patient physically be transported by private vehicle: No    Assistance Recommended at Discharge Set up Supervision/Assistance  Patient can return home with the following A lot of help with bathing/dressing/bathroom;A lot of help with walking and/or transfers;Help with stairs or ramp for entrance;Assistance with cooking/housework   Equipment Recommendations  None recommended by PT    Recommendations for Other Services       Precautions / Restrictions Precautions Precautions: Fall Restrictions Weight Bearing Restrictions: No     Mobility  Bed Mobility Overal bed mobility: Needs Assistance Bed Mobility: Rolling, Sidelying to Sit Rolling: Mod assist Sidelying to sit: Mod assist       General bed mobility comments: slow labored movement with c/o increased pain with pressure to sacral area and movement of arms/legs    Transfers Overall transfer level: Needs assistance Equipment used: Rolling walker (2 wheels) Transfers: Sit to/from Stand, Bed to chair/wheelchair/BSC Sit to Stand: Mod assist   Step pivot transfers: Mod assist, Max assist       General transfer comment: unsteady labored movement with buckling of knees    Ambulation/Gait Ambulation/Gait assistance: Mod assist, Max assist Gait Distance (Feet): 4 Feet Assistive device: Rollator (4 wheels) Gait Pattern/deviations: Decreased step length -  right, Decreased step length - left, Decreased stride length, Knees buckling, Decreased dorsiflexion - right, Decreased dorsiflexion - left Gait velocity: slow     General Gait Details: unsteady labored movement having to hike hips  to clear toes due to baseline bilateral drop foot   Stairs             Wheelchair Mobility    Modified Rankin (Stroke Patients Only)       Balance Overall balance assessment: Needs assistance Sitting-balance support: Feet supported, No upper extremity supported Sitting balance-Leahy Scale: Fair Sitting balance - Comments: seated at EOB   Standing balance support: Reliant on assistive device for balance, During functional activity, Bilateral upper extremity supported Standing balance-Leahy Scale: Poor Standing balance comment: using RW                            Cognition Arousal/Alertness: Awake/alert Behavior During Therapy: WFL for tasks assessed/performed Overall Cognitive Status: Within Functional Limits for tasks assessed                                          Exercises General Exercises - Lower Extremity Long Arc Quad: Seated, AROM, Strengthening, Left, 10 reps Hip Flexion/Marching: Seated, AROM, Strengthening, Left, 10 reps    General Comments        Pertinent Vitals/Pain Pain Assessment Pain Assessment: Faces Faces Pain Scale: Hurts even more Pain Location: over sacral wound, shoulder, legs with movement Pain Descriptors / Indicators: Discomfort, Grimacing, Guarding, Moaning, Sharp Pain Intervention(s): Limited activity within patient's tolerance, Monitored during session, Repositioned    Home Living                          Prior Function            PT Goals (current goals can now be found in the care plan section) Acute Rehab PT Goals Patient Stated Goal: return home with home aides and family to assist PT Goal Formulation: With patient Time For Goal Achievement: 11/06/22 Potential to Achieve Goals: Good Progress towards PT goals: Progressing toward goals    Frequency    Min 3X/week      PT Plan Current plan remains appropriate    Co-evaluation              AM-PAC PT "6 Clicks"  Mobility   Outcome Measure  Help needed turning from your back to your side while in a flat bed without using bedrails?: A Lot Help needed moving from lying on your back to sitting on the side of a flat bed without using bedrails?: A Lot Help needed moving to and from a bed to a chair (including a wheelchair)?: A Lot Help needed standing up from a chair using your arms (e.g., wheelchair or bedside chair)?: A Lot Help needed to walk in hospital room?: A Lot Help needed climbing 3-5 steps with a railing? : Total 6 Click Score: 11    End of Session   Activity Tolerance: Patient tolerated treatment well;Patient limited by fatigue Patient left: in chair;with call bell/phone within reach Nurse Communication: Mobility status PT Visit Diagnosis: Unsteadiness on feet (R26.81);Other abnormalities of gait and mobility (R26.89);Muscle weakness (generalized) (M62.81)     Time: 4259-5638 PT Time Calculation (min) (ACUTE ONLY): 28 min  Charges:  $Therapeutic Exercise: 8-22 mins $Therapeutic Activity:  8-22 mins                     3:06 PM, 10/24/22 Ocie Bob, MPT Physical Therapist with [facility] [phone] office [phone] mobile phone

## 2022-10-24 NOTE — TOC Progression Note (Signed)
Transition of Care [facility]) - Progression Note    Patient Details  Name: [name] MRN: [id number] Date of Birth: Jul 29, 1918  Transition of Care [facility]) CM/SW Contact  Elliot Gault, LCSW Phone Number: 10/24/2022, 2:43 PM  Clinical Narrative:     TOC following. Updated pt's son earlier today that neither SNF he requested for pt is able to accept. Son asked TOC to talk to [facility] again. This LCSW did speak to [facility] admissions rep and they are not able to offer bed to pt.  Additional referrals sent to facilities son requested. Awaiting decision.  Called pt's insurance and the [employer] was under review with the Wellsite geologist.  Updated MD. Jory Sims will follow.  Expected Discharge Plan: Skilled Nursing Facility Barriers to Discharge: Continued Medical Work up  Expected Discharge Plan and Services     Post Acute Care Choice: Skilled Nursing Facility Living arrangements for the past 2 months: Single Family Home                                       Social Determinants of Health (SDOH) Interventions SDOH Screenings   Food Insecurity: No Food Insecurity (09/17/2022)  Housing: Low Risk  (09/17/2022)  Transportation Needs: No Transportation Needs (09/17/2022)  Utilities: Not At Risk (09/17/2022)  Alcohol Screen: Low Risk  (09/17/2022)  Depression (PHQ2-9): Low Risk  (09/17/2022)  Financial Resource Strain: Low Risk  (09/17/2022)  Physical Activity: Insufficiently Active (09/17/2022)  Social Connections: Moderately Integrated (09/17/2022)  Stress: No Stress Concern Present (09/17/2022)  Tobacco Use: Low Risk  (10/21/2022)    Readmission Risk Interventions     No data to display

## 2022-10-25 ENCOUNTER — Other Ambulatory Visit (HOSPITAL_COMMUNITY): Payer: Self-pay

## 2022-10-25 DIAGNOSIS — I129 Hypertensive chronic kidney disease with stage 1 through stage 4 chronic kidney disease, or unspecified chronic kidney disease: Secondary | ICD-10-CM | POA: Diagnosis present

## 2022-10-25 DIAGNOSIS — N179 Acute kidney failure, unspecified: Secondary | ICD-10-CM | POA: Diagnosis present

## 2022-10-25 DIAGNOSIS — Z86711 Personal history of pulmonary embolism: Secondary | ICD-10-CM | POA: Diagnosis not present

## 2022-10-25 DIAGNOSIS — Z96642 Presence of left artificial hip joint: Secondary | ICD-10-CM | POA: Diagnosis present

## 2022-10-25 DIAGNOSIS — D508 Other iron deficiency anemias: Secondary | ICD-10-CM | POA: Diagnosis not present

## 2022-10-25 DIAGNOSIS — R5381 Other malaise: Secondary | ICD-10-CM | POA: Diagnosis not present

## 2022-10-25 DIAGNOSIS — A498 Other bacterial infections of unspecified site: Secondary | ICD-10-CM | POA: Diagnosis not present

## 2022-10-25 DIAGNOSIS — D649 Anemia, unspecified: Secondary | ICD-10-CM | POA: Diagnosis not present

## 2022-10-25 DIAGNOSIS — E872 Acidosis, unspecified: Secondary | ICD-10-CM | POA: Diagnosis present

## 2022-10-25 DIAGNOSIS — I959 Hypotension, unspecified: Secondary | ICD-10-CM | POA: Diagnosis not present

## 2022-10-25 DIAGNOSIS — E871 Hypo-osmolality and hyponatremia: Secondary | ICD-10-CM | POA: Diagnosis present

## 2022-10-25 DIAGNOSIS — R531 Weakness: Secondary | ICD-10-CM | POA: Diagnosis not present

## 2022-10-25 DIAGNOSIS — E876 Hypokalemia: Secondary | ICD-10-CM | POA: Diagnosis not present

## 2022-10-25 DIAGNOSIS — D509 Iron deficiency anemia, unspecified: Secondary | ICD-10-CM | POA: Diagnosis present

## 2022-10-25 DIAGNOSIS — L89150 Pressure ulcer of sacral region, unstageable: Secondary | ICD-10-CM | POA: Diagnosis present

## 2022-10-25 DIAGNOSIS — N189 Chronic kidney disease, unspecified: Secondary | ICD-10-CM | POA: Diagnosis not present

## 2022-10-25 DIAGNOSIS — Z79899 Other long term (current) drug therapy: Secondary | ICD-10-CM | POA: Diagnosis not present

## 2022-10-25 DIAGNOSIS — K5900 Constipation, unspecified: Secondary | ICD-10-CM | POA: Diagnosis not present

## 2022-10-25 DIAGNOSIS — Z515 Encounter for palliative care: Secondary | ICD-10-CM | POA: Diagnosis not present

## 2022-10-25 DIAGNOSIS — R7889 Finding of other specified substances, not normally found in blood: Secondary | ICD-10-CM | POA: Diagnosis not present

## 2022-10-25 DIAGNOSIS — Z923 Personal history of irradiation: Secondary | ICD-10-CM | POA: Diagnosis not present

## 2022-10-25 DIAGNOSIS — L89159 Pressure ulcer of sacral region, unspecified stage: Secondary | ICD-10-CM | POA: Diagnosis not present

## 2022-10-25 DIAGNOSIS — R609 Edema, unspecified: Secondary | ICD-10-CM | POA: Diagnosis not present

## 2022-10-25 DIAGNOSIS — M6281 Muscle weakness (generalized): Secondary | ICD-10-CM | POA: Diagnosis not present

## 2022-10-25 DIAGNOSIS — L8993 Pressure ulcer of unspecified site, stage 3: Secondary | ICD-10-CM | POA: Diagnosis not present

## 2022-10-25 DIAGNOSIS — H6120 Impacted cerumen, unspecified ear: Secondary | ICD-10-CM | POA: Diagnosis not present

## 2022-10-25 DIAGNOSIS — R532 Functional quadriplegia: Secondary | ICD-10-CM | POA: Diagnosis present

## 2022-10-25 DIAGNOSIS — L89303 Pressure ulcer of unspecified buttock, stage 3: Secondary | ICD-10-CM | POA: Diagnosis not present

## 2022-10-25 DIAGNOSIS — L89154 Pressure ulcer of sacral region, stage 4: Secondary | ICD-10-CM | POA: Diagnosis not present

## 2022-10-25 DIAGNOSIS — H547 Unspecified visual loss: Secondary | ICD-10-CM | POA: Diagnosis present

## 2022-10-25 DIAGNOSIS — Z8542 Personal history of malignant neoplasm of other parts of uterus: Secondary | ICD-10-CM | POA: Diagnosis not present

## 2022-10-25 DIAGNOSIS — I440 Atrioventricular block, first degree: Secondary | ICD-10-CM | POA: Diagnosis present

## 2022-10-25 DIAGNOSIS — I10 Essential (primary) hypertension: Secondary | ICD-10-CM | POA: Diagnosis not present

## 2022-10-25 DIAGNOSIS — Z7401 Bed confinement status: Secondary | ICD-10-CM | POA: Diagnosis not present

## 2022-10-25 DIAGNOSIS — E43 Unspecified severe protein-calorie malnutrition: Secondary | ICD-10-CM | POA: Diagnosis present

## 2022-10-25 DIAGNOSIS — A0472 Enterocolitis due to Clostridium difficile, not specified as recurrent: Secondary | ICD-10-CM | POA: Diagnosis not present

## 2022-10-25 DIAGNOSIS — L089 Local infection of the skin and subcutaneous tissue, unspecified: Secondary | ICD-10-CM | POA: Diagnosis not present

## 2022-10-25 DIAGNOSIS — N184 Chronic kidney disease, stage 4 (severe): Secondary | ICD-10-CM | POA: Diagnosis not present

## 2022-10-25 DIAGNOSIS — E878 Other disorders of electrolyte and fluid balance, not elsewhere classified: Secondary | ICD-10-CM | POA: Diagnosis not present

## 2022-10-25 DIAGNOSIS — L89323 Pressure ulcer of left buttock, stage 3: Secondary | ICD-10-CM | POA: Diagnosis present

## 2022-10-25 DIAGNOSIS — Z66 Do not resuscitate: Secondary | ICD-10-CM | POA: Diagnosis present

## 2022-10-25 DIAGNOSIS — Z681 Body mass index (BMI) 19 or less, adult: Secondary | ICD-10-CM | POA: Diagnosis not present

## 2022-10-25 LAB — CBC
HCT: 28.3 % — ABNORMAL LOW (ref 36.0–46.0)
Hemoglobin: 8.5 g/dL — ABNORMAL LOW (ref 12.0–15.0)
MCH: 26.1 pg (ref 26.0–34.0)
MCHC: 30 g/dL (ref 30.0–36.0)
MCV: 86.8 fL (ref 80.0–100.0)
Platelets: 459 10*3/uL — ABNORMAL HIGH (ref 150–400)
RBC: 3.26 MIL/uL — ABNORMAL LOW (ref 3.87–5.11)
RDW: 17.5 % — ABNORMAL HIGH (ref 11.5–15.5)
WBC: 12.2 10*3/uL — ABNORMAL HIGH (ref 4.0–10.5)
nRBC: 1.6 % — ABNORMAL HIGH (ref 0.0–0.2)

## 2022-10-25 LAB — BASIC METABOLIC PANEL
Anion gap: 11 (ref 5–15)
BUN: 23 mg/dL (ref 8–23)
CO2: 19 mmol/L — ABNORMAL LOW (ref 22–32)
Calcium: 8.4 mg/dL — ABNORMAL LOW (ref 8.9–10.3)
Chloride: 106 mmol/L (ref 98–111)
Creatinine, Ser: 1.53 mg/dL — ABNORMAL HIGH (ref 0.44–1.00)
GFR, Estimated: 30 mL/min — ABNORMAL LOW (ref >60)
Glucose, Bld: 84 mg/dL (ref 70–99)
Potassium: 3.7 mmol/L (ref 3.5–5.1)
Sodium: 136 mmol/L (ref 135–145)

## 2022-10-25 LAB — MAGNESIUM: Magnesium: 2.7 mg/dL — ABNORMAL HIGH (ref 1.7–2.4)

## 2022-10-25 MED ORDER — VANCOMYCIN HCL 125 MG PO CAPS: 125.0000 mg | ORAL_CAPSULE | Freq: Four times a day (QID) | ORAL | Status: DC

## 2022-10-25 MED ORDER — DOXYCYCLINE HYCLATE 100 MG PO TABS: 100.0000 mg | ORAL_TABLET | Freq: Two times a day (BID) | ORAL | Status: DC

## 2022-10-25 MED ORDER — CEFDINIR 300 MG PO CAPS
300.0000 mg | ORAL_CAPSULE | Freq: Every day | ORAL | Status: DC
  Administered 2022-10-25: 300 mg via ORAL
  Filled 2022-10-25: qty 1

## 2022-10-25 MED ORDER — DOXYCYCLINE HYCLATE 100 MG PO TABS
100.0000 mg | ORAL_TABLET | Freq: Two times a day (BID) | ORAL | Status: DC
  Administered 2022-10-25: 100 mg via ORAL
  Filled 2022-10-25: qty 1

## 2022-10-25 MED ORDER — CEFDINIR 300 MG PO CAPS: 300.0000 mg | ORAL_CAPSULE | Freq: Every day | ORAL | Status: DC

## 2022-10-25 NOTE — Progress Notes (Signed)
Patients daughter spoke with this writing regarding patients left hearing aide being missing since yesterday, the daughter reported that she spoke with the nurse yesterday regarding the hearing aide being lost. Received at report that the previous nurse was informed that management was made aware. Informed patients daughter about management being informed.

## 2022-10-25 NOTE — Consult Note (Signed)
[facility] Customer service manager [facility]) Accountable Care Organization (ACO) [facility] Liaison Note  10/25/2022  [name] 1918/10/08 [id number]  Location: RN Hospital Liaison screened the patient remotely at [facility]. Coverage for Elliot Cousin RN [facility]  Insurance: [employer]   [name] is a 89 y.o. female who is a Primary Care Patient of Carylon Perches, MD. The patient was screened for disposition needs for readmission prevention with noted age and self-care deficits noted with lived alone PTA, family close by per inpatient TOC notes. Son, [name] listed as POA.  The patient was assessed for potential [facility]) Care Management service needs for post hospital transition for care coordination. Review of patient's electronic medical record reveals patient is being recommended for a skilled nursing level of care.   Plan:   Patient is for a skilled nursing facility level of care for wound care and rehab. No current Care Coordination follow up at the facility as post hospital needs will be at a SNF level of care.  [facility] Care Management/Population Health does not replace or interfere with any arrangements made by the Inpatient Transition of Care team.   For questions contact:   Charlesetta Shanks, RN BSN CCM [facility]  [phone] business mobile phone Toll free office [phone]  *Concierge Line  [phone] Fax number: [phone] [email or web address]

## 2022-10-25 NOTE — Discharge Summary (Addendum)
Physician Discharge Summary   Patient: [name] MRN: [id number] DOB: [birth year]  Admit date:     10/21/2022  Discharge date: 10/25/22  Discharge Physician: Onalee Hua Yaman Grauberger   PCP: Carylon Perches, MD   Recommendations at discharge:   Please follow up with primary care provider within 1-2 weeks  Please repeat BMP and CBC in one week      Hospital Course: As per H&P written by Dr. Mariea Clonts on 10/21/2022 [name] is a 89 y.o. female with medical history significant for hypertension, pulmonary embolism, small bowel obstruction. Patient is brought to the ED with complaints of generalized weakness, and pain to her back.  Patient is mostly bedbound at baseline, but she is able to assist with transfers.  She has a sacral decubitus wound, daughter at bedside - June, states the wound started 2 weeks ago.  She also has adhered to her right forearm over the past week.  Patient has some bilateral lower extremity swelling, so patient son increased the dose of her Lasix from 20 to 60 mg over the past 3 days.  Since then patient has become generally more weak.  Patient has a caregiver, but her Son, [name] who is his HCPOA lives close by and checks on her frequently. Patient has chronic diarrhea that is unchanged. Patient is hard of hearing, and is barely able to see, but per daughter has no memory deficits whatsoever.  She is able to answer questions.  She reports some tingling feeling with urination.  Otherwise she denies chest pain or difficulty breathing.   ED Course: Temperature 97.8.  Heart rate 70s to 80s, respiratory rate 13-26.  Blood pressure systolic 105-170.  O2 sats greater than 92% on room air. Leukocytosis of 15.6.  Lactic acidosis of 2.1 > 1.1.  Troponin 19> 17.  BNP 195.  Port chest x-ray negative for acute abnormality. MRI- pelvis with and without contrast-shows sacral decubitus ulcer without underlying osteomyelitis or discrete drainable abscess.    Assessment and Plan: Infected  decubitus ulcer--unstageable -Unstageable sacral wound appreciated on exam; erythematous changes surrounding wound and follow recommendation by wound care service - -appreciate surgery consult and bedside debridement -I discussed with Dr. Essie Christine tunneling, no pus -continue IV abx another 24 hours>>transitioned to po abx 6/28 -Follow WBCs, culture results and clinical response.   -MRI negative presence of osteomyelitis/abscesses. -d/c with doxy and cefdinir x 4 more days -wound care per wound care team>>> Apply Medihoney to sacrum wound Q day, then cover with foam dressing.  Change foam dressing Q 3 days or PRN soiling.    CKD (chronic kidney disease) stage 4, GFR 15-29 ml/min (HCC) -Chronic kidney disease stage IV at baseline -Continue to maintain adequate hydration and minimizing nephrotoxic agents -baseline creatinine 1.8-2.0 -serum creatinine 1.53 on day of d/c   Chronic diarrhea>>Cdiff colitis -Cdiff antigen positive, Cdiff PCR positive -stool pathogen panel+norovirus -started po vanc 125 mg po q 6 x 9 more days after d/c -only one BM in last 24 hours prior to d/c.  No abd pain.  Tolerating diet at time of d/c   Essential (primary) hypertension -Blood pressure stable overall -resumed dilitaizem and hydralazine   History of PE -pt is not longer on wafarin per pharmacy and Dr. Alonza Smoker office   Hypokalemia/Hypomagnesemia -repleted       Family Communication:   son updated 6/28   Consultants:  gen surgery   Code Status:  DNR      Procedures: As Listed in Progress Note Above  Consultants: none Procedures performed: none  Disposition: Home Diet recommendation:  Regular diet DISCHARGE MEDICATION: Allergies as of 10/25/2022   No Known Allergies      Medication List     STOP taking these medications    CALCIUM + D PO   cyanocobalamin 1000 MCG/ML injection Commonly known as: VITAMIN B12   ibuprofen 600 MG tablet Commonly known as:  ADVIL   loperamide 2 MG capsule Commonly known as: IMODIUM   ZINC PO       TAKE these medications    calcitRIOL 0.25 MCG capsule Commonly known as: ROCALTROL Take 0.25 mcg by mouth daily.   cefdinir 300 MG capsule Commonly known as: OMNICEF Take 1 capsule (300 mg total) by mouth daily at 2 PM. X 4 days   diltiazem 240 MG 24 hr capsule Commonly known as: CARDIZEM CD Take 240 mg by mouth daily.   doxycycline 100 MG tablet Commonly known as: VIBRA-TABS Take 1 tablet (100 mg total) by mouth every 12 (twelve) hours. X 4 days   furosemide 20 MG tablet Commonly known as: LASIX Take 20 mg by mouth daily.   hydrALAZINE 25 MG tablet Commonly known as: APRESOLINE Take 25 mg by mouth 2 (two) times daily.   vancomycin 125 MG capsule Commonly known as: VANCOCIN Take 1 capsule (125 mg total) by mouth 4 (four) times daily. X 9 days        Contact information for after-discharge care     Destination     [facility] [place] [facility] .   Service: Skilled Nursing Contact information: [address] [place] [address] [phone]                    Discharge Exam: Ceasar Mons Weights   10/21/22 1231  Weight: 45.4 kg   HEENT:  [place]/AT, No thrush, no icterus CV:  RRR, no rub, no S3, no S4 Lung:  bibasilar crackles. No wheeze Abd:  soft/+BS, NT Ext:  No edema, no lymphangitis, no synovitis, no rash   Condition at discharge: stable  The results of significant diagnostics from this hospitalization (including imaging, microbiology, ancillary and laboratory) are listed below for reference.   Imaging Studies: MR PELVIS W WO CONTRAST  Result Date: 10/21/2022 CLINICAL DATA:  Sacral wound. EXAM: MRI PELVIS WITHOUT AND WITH CONTRAST TECHNIQUE: Multiplanar multisequence MR imaging of the pelvis was performed both before and after administration of intravenous contrast. CONTRAST:  5mL GADAVIST GADOBUTROL 1 MMOL/ML IV SOLN COMPARISON:  CT  abdomen pelvis dated August 16, 2012. FINDINGS: Urinary Tract:  No abnormality visualized. Bowel:  Sigmoid diverticulosis. Vascular/Lymphatic: No pathologically enlarged lymph nodes. No significant vascular abnormality seen. Reproductive:  Prior hysterectomy. Other: Chronic common bile duct dilatation status post cholecystectomy. Musculoskeletal: Sacral decubitus ulcer without underlying bony signal abnormality. Soft tissue enhancement around the decubitus ulcer without discrete fluid collection. Prior left hip arthroplasty. Small amount of fluid in the right greater trochanteric bursa. IMPRESSION: 1. Sacral decubitus ulcer without underlying osteomyelitis or discrete drainable abscess. Electronically Signed   By: Obie Dredge M.D.   On: 10/21/2022 16:29   DG Chest Port 1 View  Result Date: 10/21/2022 CLINICAL DATA:  Questionable sepsis. Weakness and diarrhea for 1 week. Patient is no longer able to ambulate with her walker. EXAM: PORTABLE CHEST 1 VIEW COMPARISON:  Two-view chest x-ray 06/05/2009 FINDINGS: The heart size is upper limits of normal. Atherosclerotic calcifications are present at the aortic arch. Changes of COPD are noted. No edema or  effusion is present. No focal airspace disease is present. Asymmetric degenerative changes are present at the left shoulder. IMPRESSION: 1. No acute cardiopulmonary disease. 2. COPD. 3. Aortic atherosclerosis. Electronically Signed   By: Marin Roberts M.D.   On: 10/21/2022 13:23    Microbiology: Results for orders placed or performed during the hospital encounter of 10/21/22  Blood Culture (routine x 2)     Status: None (Preliminary result)   Collection Time: 10/21/22 12:40 PM   Specimen: BLOOD  Result Value Ref Range Status   Specimen Description BLOOD BLOOD LEFT ARM  Final   Special Requests   Final    Blood Culture adequate volume BOTTLES DRAWN AEROBIC AND ANAEROBIC   Culture   Final    NO GROWTH 4 DAYS Performed at [facility], [address] [place], [place] [address]    Report Status PENDING  Incomplete  Blood Culture (routine x 2)     Status: None (Preliminary result)   Collection Time: 10/21/22 12:45 PM   Specimen: BLOOD  Result Value Ref Range Status   Specimen Description BLOOD BLOOD RIGHT ARM  Final   Special Requests   Final    BOTTLES DRAWN AEROBIC AND ANAEROBIC Blood Culture results may not be optimal due to an excessive volume of blood received in culture bottles   Culture   Final    NO GROWTH 4 DAYS Performed at [facility], [address] [place], [place] [address]    Report Status PENDING  Incomplete  C Difficile Quick Screen w PCR reflex     Status: Abnormal   Collection Time: 10/23/22  6:25 PM   Specimen: STOOL  Result Value Ref Range Status   C Diff antigen POSITIVE (A) NEGATIVE Final   C Diff toxin NEGATIVE NEGATIVE Final   C Diff interpretation Results are indeterminate. See PCR results.  Final    Comment: Performed at [facility], [address], [place], [place] [address]  Gastrointestinal Panel by PCR , Stool     Status: Abnormal   Collection Time: 10/23/22  6:25 PM   Specimen: Stool  Result Value Ref Range Status   Campylobacter species NOT DETECTED NOT DETECTED Final   Plesimonas shigelloides NOT DETECTED NOT DETECTED Final   Salmonella species NOT DETECTED NOT DETECTED Final   Yersinia enterocolitica NOT DETECTED NOT DETECTED Final   Vibrio species NOT DETECTED NOT DETECTED Final   Vibrio cholerae NOT DETECTED NOT DETECTED Final   Enteroaggregative E coli (EAEC) NOT DETECTED NOT DETECTED Final   Enteropathogenic E coli (EPEC) NOT DETECTED NOT DETECTED Final   Enterotoxigenic E coli (ETEC) NOT DETECTED NOT DETECTED Final   Shiga like toxin producing E coli (STEC) NOT DETECTED NOT DETECTED Final   Shigella/Enteroinvasive E coli (EIEC) NOT DETECTED NOT DETECTED Final   Cryptosporidium NOT DETECTED NOT DETECTED Final   Cyclospora cayetanensis NOT DETECTED NOT DETECTED Final   Entamoeba  histolytica NOT DETECTED NOT DETECTED Final   Giardia lamblia NOT DETECTED NOT DETECTED Final   Adenovirus F40/41 NOT DETECTED NOT DETECTED Final   Astrovirus NOT DETECTED NOT DETECTED Final   Norovirus GI/GII DETECTED (A) NOT DETECTED Final    Comment: CRITICAL RESULT CALLED TO, READ BACK BY AND VERIFIED WITH:  C/ PATRICIA MILES AT 955 10/24/22 JG    Rotavirus A NOT DETECTED NOT DETECTED Final   Sapovirus (I, II, IV, and V) NOT DETECTED NOT DETECTED Final    Comment: Performed at [facility], [address] [place], [place]  [address]  C. Diff by PCR, Reflexed     Status: Abnormal   Collection Time: 10/23/22  6:25 PM  Result Value Ref Range Status   Toxigenic C. Difficile by PCR POSITIVE (A) NEGATIVE Final    Comment: Positive for toxigenic C. difficile with little to no toxin production. Only treat if clinical presentation suggests symptomatic illness. Performed at [facility], [address], [place], [place] [address]     Labs: CBC: Recent Labs  Lab 10/21/22 1245 10/22/22 0531 10/23/22 0534 10/24/22 0526 10/25/22 0538  WBC 15.6* 13.2* 13.3* 11.4* 12.2*  NEUTROABS 11.8*  --   --   --   --   HGB 9.6* 8.1* 7.5* 7.3* 8.5*  HCT 30.0* 25.3* 24.2* 22.6* 28.3*  MCV 81.5 81.9 83.2 82.2 86.8  PLT 532* 491* 491* 489* 459*   Basic Metabolic Panel: Recent Labs  Lab 10/21/22 1245 10/22/22 0531 10/23/22 0534 10/24/22 0526 10/25/22 0538  NA 132* 135 134* 135 136  K 3.6 4.0 3.7 3.3* 3.7  CL 92* 99 100 102 106  CO2 28 26 24 23  19*  GLUCOSE 108* 91 99 97 84  BUN 48* 41* 31* 27* 23  CREATININE 2.06* 1.90* 1.80* 1.69* 1.53*  CALCIUM 9.2 8.3* 8.2* 8.1* 8.4*  MG  --   --  1.6* 1.5* 2.7*   Liver Function Tests: Recent Labs  Lab 10/21/22 1245  AST 15  ALT 9  ALKPHOS 52  BILITOT 0.7  PROT 6.9  ALBUMIN 2.9*   CBG: No results for input(s): "GLUCAP" in the last 168 hours.  Discharge time spent: greater than 30 minutes.  Signed: Catarina Hartshorn, MD [facility] 10/25/2022

## 2022-10-25 NOTE — Care Management Important Message (Signed)
Important Message  Patient Details  Name: [name] MRN: [id number] Date of Birth: [birth year]   Medicare Important Message Given:  Other (see comment) (left voicemail for [name] at [phone], will mail copy to address on file)     Corey Harold 10/25/2022, 4:18 PM

## 2022-10-25 NOTE — TOC Benefit Eligibility Note (Addendum)
Pharmacy Patient Advocate Encounter  Insurance verification completed.    The patient is insured through [employer]/ [employer] Medicare Part D  Ran test claim for vancomycin 125 mg capsules and the current 30 day co-pay is $4.50.   This test claim was processed through [facility]- copay amounts may vary at other pharmacies due to pharmacy/plan contracts, or as the patient moves through the different stages of their insurance plan.    Roland Earl, CPHT Pharmacy Patient Advocate Specialist [facility] Patient Advocate Team Direct Number: [phone]  Fax: [phone]

## 2022-10-25 NOTE — TOC Transition Note (Signed)
Transition of Care [facility]) - CM/SW Discharge Note   Patient Details  Name: [name] MRN: [id number] Date of Birth: [birth year]  Transition of Care [facility]) CM/SW Contact:  Annice Needy, LCSW Phone Number: 10/25/2022, 12:32 PM   Clinical Narrative:    D/c clinicals sent to facility. Nurse to call report. EMS to provide transport. EMS cannot pick patient up until after 6, nurse and facility aware. TOC signing off.    Final next level of care: Skilled Nursing Facility Barriers to Discharge: No Barriers Identified   Patient Goals and CMS Choice      Discharge Placement                Patient chooses bed at: [facility] Patient to be transferred to facility by: [facility] Name of family member notified: [name], son Patient and family notified of of transfer: 10/25/22  Discharge Plan and Services Additional resources added to the After Visit Summary for       Post Acute Care Choice: Skilled Nursing Facility                               Social Determinants of Health (SDOH) Interventions SDOH Screenings   Food Insecurity: No Food Insecurity (09/17/2022)  Housing: Low Risk  (09/17/2022)  Transportation Needs: No Transportation Needs (09/17/2022)  Utilities: Not At Risk (09/17/2022)  Alcohol Screen: Low Risk  (09/17/2022)  Depression (PHQ2-9): Low Risk  (09/17/2022)  Financial Resource Strain: Low Risk  (09/17/2022)  Physical Activity: Insufficiently Active (09/17/2022)  Social Connections: Moderately Integrated (09/17/2022)  Stress: No Stress Concern Present (09/17/2022)  Tobacco Use: Low Risk  (10/21/2022)     Readmission Risk Interventions     No data to display

## 2022-10-26 LAB — CULTURE, BLOOD (ROUTINE X 2)
Culture: NO GROWTH
Culture: NO GROWTH

## 2022-10-28 DIAGNOSIS — L89159 Pressure ulcer of sacral region, unspecified stage: Secondary | ICD-10-CM | POA: Diagnosis not present

## 2022-10-28 DIAGNOSIS — N184 Chronic kidney disease, stage 4 (severe): Secondary | ICD-10-CM | POA: Diagnosis not present

## 2022-10-28 DIAGNOSIS — R5381 Other malaise: Secondary | ICD-10-CM | POA: Diagnosis not present

## 2022-10-28 DIAGNOSIS — I10 Essential (primary) hypertension: Secondary | ICD-10-CM | POA: Diagnosis not present

## 2022-10-28 DIAGNOSIS — A498 Other bacterial infections of unspecified site: Secondary | ICD-10-CM | POA: Diagnosis not present

## 2022-10-28 DIAGNOSIS — L089 Local infection of the skin and subcutaneous tissue, unspecified: Secondary | ICD-10-CM | POA: Diagnosis not present

## 2022-10-28 DIAGNOSIS — H6120 Impacted cerumen, unspecified ear: Secondary | ICD-10-CM | POA: Diagnosis not present

## 2022-10-28 DIAGNOSIS — K5900 Constipation, unspecified: Secondary | ICD-10-CM | POA: Diagnosis not present

## 2022-10-29 DIAGNOSIS — A498 Other bacterial infections of unspecified site: Secondary | ICD-10-CM | POA: Diagnosis not present

## 2022-10-29 DIAGNOSIS — N184 Chronic kidney disease, stage 4 (severe): Secondary | ICD-10-CM | POA: Diagnosis not present

## 2022-10-29 DIAGNOSIS — M6281 Muscle weakness (generalized): Secondary | ICD-10-CM | POA: Diagnosis not present

## 2022-10-29 DIAGNOSIS — Z681 Body mass index (BMI) 19 or less, adult: Secondary | ICD-10-CM | POA: Diagnosis not present

## 2022-10-29 DIAGNOSIS — R609 Edema, unspecified: Secondary | ICD-10-CM | POA: Diagnosis not present

## 2022-10-29 DIAGNOSIS — L89154 Pressure ulcer of sacral region, stage 4: Secondary | ICD-10-CM | POA: Diagnosis not present

## 2022-10-29 DIAGNOSIS — R5381 Other malaise: Secondary | ICD-10-CM | POA: Diagnosis not present

## 2022-10-29 DIAGNOSIS — L89159 Pressure ulcer of sacral region, unspecified stage: Secondary | ICD-10-CM | POA: Diagnosis not present

## 2022-10-29 DIAGNOSIS — I10 Essential (primary) hypertension: Secondary | ICD-10-CM | POA: Diagnosis not present

## 2022-11-01 ENCOUNTER — Inpatient Hospital Stay
Admission: EM | Admit: 2022-11-01 | Discharge: 2022-11-08 | DRG: 682 | Disposition: A | Discharge status: Home Health | Payer: PPO | Attending: Internal Medicine | Admitting: Internal Medicine

## 2022-11-01 ENCOUNTER — Other Ambulatory Visit: Payer: Self-pay

## 2022-11-01 DIAGNOSIS — Z923 Personal history of irradiation: Secondary | ICD-10-CM

## 2022-11-01 DIAGNOSIS — A0472 Enterocolitis due to Clostridium difficile, not specified as recurrent: Secondary | ICD-10-CM | POA: Diagnosis present

## 2022-11-01 DIAGNOSIS — R532 Functional quadriplegia: Secondary | ICD-10-CM | POA: Insufficient documentation

## 2022-11-01 DIAGNOSIS — E878 Other disorders of electrolyte and fluid balance, not elsewhere classified: Secondary | ICD-10-CM | POA: Diagnosis present

## 2022-11-01 DIAGNOSIS — H547 Unspecified visual loss: Secondary | ICD-10-CM | POA: Diagnosis present

## 2022-11-01 DIAGNOSIS — N179 Acute kidney failure, unspecified: Principal | ICD-10-CM | POA: Diagnosis present

## 2022-11-01 DIAGNOSIS — I129 Hypertensive chronic kidney disease with stage 1 through stage 4 chronic kidney disease, or unspecified chronic kidney disease: Secondary | ICD-10-CM | POA: Diagnosis present

## 2022-11-01 DIAGNOSIS — L89303 Pressure ulcer of unspecified buttock, stage 3: Secondary | ICD-10-CM | POA: Insufficient documentation

## 2022-11-01 DIAGNOSIS — L89150 Pressure ulcer of sacral region, unstageable: Secondary | ICD-10-CM | POA: Diagnosis present

## 2022-11-01 DIAGNOSIS — E872 Acidosis, unspecified: Secondary | ICD-10-CM | POA: Diagnosis present

## 2022-11-01 DIAGNOSIS — Z79899 Other long term (current) drug therapy: Secondary | ICD-10-CM

## 2022-11-01 DIAGNOSIS — E876 Hypokalemia: Secondary | ICD-10-CM | POA: Diagnosis present

## 2022-11-01 DIAGNOSIS — N184 Chronic kidney disease, stage 4 (severe): Secondary | ICD-10-CM | POA: Diagnosis present

## 2022-11-01 DIAGNOSIS — E871 Hypo-osmolality and hyponatremia: Secondary | ICD-10-CM | POA: Diagnosis present

## 2022-11-01 DIAGNOSIS — I10 Essential (primary) hypertension: Secondary | ICD-10-CM | POA: Diagnosis present

## 2022-11-01 DIAGNOSIS — Z7401 Bed confinement status: Secondary | ICD-10-CM

## 2022-11-01 DIAGNOSIS — D509 Iron deficiency anemia, unspecified: Secondary | ICD-10-CM | POA: Insufficient documentation

## 2022-11-01 DIAGNOSIS — Z8542 Personal history of malignant neoplasm of other parts of uterus: Secondary | ICD-10-CM

## 2022-11-01 DIAGNOSIS — Z515 Encounter for palliative care: Secondary | ICD-10-CM

## 2022-11-01 DIAGNOSIS — Z681 Body mass index (BMI) 19 or less, adult: Secondary | ICD-10-CM

## 2022-11-01 DIAGNOSIS — E43 Unspecified severe protein-calorie malnutrition: Secondary | ICD-10-CM | POA: Insufficient documentation

## 2022-11-01 DIAGNOSIS — D649 Anemia, unspecified: Secondary | ICD-10-CM | POA: Diagnosis present

## 2022-11-01 DIAGNOSIS — Z86711 Personal history of pulmonary embolism: Secondary | ICD-10-CM

## 2022-11-01 DIAGNOSIS — L89323 Pressure ulcer of left buttock, stage 3: Secondary | ICD-10-CM | POA: Diagnosis present

## 2022-11-01 DIAGNOSIS — H9190 Unspecified hearing loss, unspecified ear: Secondary | ICD-10-CM | POA: Diagnosis present

## 2022-11-01 DIAGNOSIS — Z66 Do not resuscitate: Secondary | ICD-10-CM | POA: Diagnosis present

## 2022-11-01 DIAGNOSIS — Z96642 Presence of left artificial hip joint: Secondary | ICD-10-CM | POA: Diagnosis present

## 2022-11-01 DIAGNOSIS — N189 Chronic kidney disease, unspecified: Secondary | ICD-10-CM | POA: Diagnosis present

## 2022-11-01 DIAGNOSIS — I440 Atrioventricular block, first degree: Secondary | ICD-10-CM | POA: Diagnosis present

## 2022-11-01 LAB — CBC WITH DIFFERENTIAL/PLATELET
Abs Immature Granulocytes: 0.1 10*3/uL — ABNORMAL HIGH (ref 0.00–0.07)
Basophils Absolute: 0.1 10*3/uL (ref 0.0–0.1)
Basophils Relative: 0 %
Eosinophils Absolute: 0.2 10*3/uL (ref 0.0–0.5)
Eosinophils Relative: 1 %
HCT: 25.4 % — ABNORMAL LOW (ref 36.0–46.0)
Hemoglobin: 8.4 g/dL — ABNORMAL LOW (ref 12.0–15.0)
Immature Granulocytes: 1 %
Lymphocytes Relative: 19 %
Lymphs Abs: 3.5 10*3/uL (ref 0.7–4.0)
MCH: 25.9 pg — ABNORMAL LOW (ref 26.0–34.0)
MCHC: 33.1 g/dL (ref 30.0–36.0)
MCV: 78.4 fL — ABNORMAL LOW (ref 80.0–100.0)
Monocytes Absolute: 1.4 10*3/uL — ABNORMAL HIGH (ref 0.1–1.0)
Monocytes Relative: 8 %
Neutro Abs: 13.2 10*3/uL — ABNORMAL HIGH (ref 1.7–7.7)
Neutrophils Relative %: 71 %
Platelets: 626 10*3/uL — ABNORMAL HIGH (ref 150–400)
RBC: 3.24 MIL/uL — ABNORMAL LOW (ref 3.87–5.11)
RDW: 17.3 % — ABNORMAL HIGH (ref 11.5–15.5)
WBC: 18.3 10*3/uL — ABNORMAL HIGH (ref 4.0–10.5)
nRBC: 0 % (ref 0.0–0.2)

## 2022-11-01 LAB — MAGNESIUM: Magnesium: 1.6 mg/dL — ABNORMAL LOW (ref 1.7–2.4)

## 2022-11-01 LAB — BASIC METABOLIC PANEL
Anion gap: 12 (ref 5–15)
BUN: 31 mg/dL — ABNORMAL HIGH (ref 8–23)
CO2: 15 mmol/L — ABNORMAL LOW (ref 22–32)
Calcium: 8.4 mg/dL — ABNORMAL LOW (ref 8.9–10.3)
Chloride: 107 mmol/L (ref 98–111)
Creatinine, Ser: 2.24 mg/dL — ABNORMAL HIGH (ref 0.44–1.00)
GFR, Estimated: 19 mL/min — ABNORMAL LOW (ref >60)
Glucose, Bld: 109 mg/dL — ABNORMAL HIGH (ref 70–99)
Potassium: 2 mmol/L — CL (ref 3.5–5.1)
Sodium: 134 mmol/L — ABNORMAL LOW (ref 135–145)

## 2022-11-01 MED ORDER — POTASSIUM CHLORIDE 20 MEQ PO PACK
40.0000 meq | PACK | Freq: Once | ORAL | Status: AC
  Administered 2022-11-02: 40 meq via ORAL
  Filled 2022-11-01: qty 2

## 2022-11-01 NOTE — ED Provider Notes (Signed)
   [facility] Provider Note    Event Date/Time   First MD Initiated Contact with Patient 11/01/22 2313     (approximate)   History   No chief complaint on file.   HPI  [name] is a 89 y.o. female who presents to the ED for evaluation of No chief complaint on file.   Review medical DC summary from 6/28.  Admitted for a few days due to generalized weakness.  Bedbound with unstageable decubitus sacral ulcer.  Chronic diarrhea with C, discharged on oral vanco. difficile positive, hard of hearing and nearly blind at baseline.  Discharged on doxycycline and cefdinir for the ulcer, no osteo on MRI. Discharged to SNF.   Patient presents to the ED via EMS from her facility due to apparently routine lab draw demonstrating a critically low potassium of 2.2.  Patient has no complaints.   Physical Exam   Triage Vital Signs: ED Triage Vitals  Enc Vitals Group     BP      Pulse      Resp      Temp      Temp src      SpO2      Weight      Height      Head Circumference      Peak Flow      Pain Score      Pain Loc      Pain Edu?      Excl. in GC?     Most recent vital signs: There were no vitals filed for this visit.  General: Awake, no distress. *** CV:  Good peripheral perfusion.  Resp:  Normal effort.  Abd:  No distention.  MSK:  No deformity noted.  Neuro:  No focal deficits appreciated. Other:     ED Results / Procedures / Treatments   Labs (all labs ordered are listed, but only abnormal results are displayed) Labs Reviewed  CBC WITH DIFFERENTIAL/PLATELET  BASIC METABOLIC PANEL  MAGNESIUM  URINALYSIS, ROUTINE W REFLEX MICROSCOPIC    EKG Tremulous baseline, sinus rhythm with a rate of 94 bpm.  Normal axis.  First-degree AV block with PR interval 254.  No other or high-grade AV blocks.  No STEMI.  1 PVC.  RADIOLOGY ***  Official radiology report(s): No results found.  PROCEDURES and  INTERVENTIONS:  Procedures  Medications  potassium chloride (KLOR-CON) packet 40 mEq (has no administration in time range)     IMPRESSION / MDM / ASSESSMENT AND PLAN / ED COURSE  I reviewed the triage vital signs and the nursing notes.  Differential diagnosis includes, but is not limited to, ***  {Patient presents with symptoms of an acute illness or injury that is potentially life-threatening.}      FINAL CLINICAL IMPRESSION(S) / ED DIAGNOSES   Final diagnoses:  None     Rx / DC Orders   ED Discharge Orders     None        Note:  This document was prepared using Dragon voice recognition software and may include unintentional dictation errors.

## 2022-11-01 NOTE — ED Triage Notes (Signed)
Pt sent by [facility] from [facility]. Pt has not complaints but facility reports potassium of 2.2. unknown why facility was checking lab work per EMS. Pt denies chest pain. Cardiac monitor placed upon arrival to ED room and EKG obtained. Pt alert and oriented following commands. Breathing unlabored speaking in full sentences.

## 2022-11-02 DIAGNOSIS — Z66 Do not resuscitate: Secondary | ICD-10-CM | POA: Diagnosis not present

## 2022-11-02 DIAGNOSIS — Z8542 Personal history of malignant neoplasm of other parts of uterus: Secondary | ICD-10-CM | POA: Diagnosis not present

## 2022-11-02 DIAGNOSIS — N179 Acute kidney failure, unspecified: Secondary | ICD-10-CM

## 2022-11-02 DIAGNOSIS — D508 Other iron deficiency anemias: Secondary | ICD-10-CM | POA: Diagnosis not present

## 2022-11-02 DIAGNOSIS — A0472 Enterocolitis due to Clostridium difficile, not specified as recurrent: Secondary | ICD-10-CM | POA: Diagnosis not present

## 2022-11-02 DIAGNOSIS — D649 Anemia, unspecified: Secondary | ICD-10-CM | POA: Diagnosis present

## 2022-11-02 DIAGNOSIS — H547 Unspecified visual loss: Secondary | ICD-10-CM | POA: Diagnosis not present

## 2022-11-02 DIAGNOSIS — L89323 Pressure ulcer of left buttock, stage 3: Secondary | ICD-10-CM | POA: Diagnosis not present

## 2022-11-02 DIAGNOSIS — E876 Hypokalemia: Secondary | ICD-10-CM | POA: Diagnosis not present

## 2022-11-02 DIAGNOSIS — E871 Hypo-osmolality and hyponatremia: Secondary | ICD-10-CM | POA: Diagnosis present

## 2022-11-02 DIAGNOSIS — Z86711 Personal history of pulmonary embolism: Secondary | ICD-10-CM | POA: Diagnosis not present

## 2022-11-02 DIAGNOSIS — R532 Functional quadriplegia: Secondary | ICD-10-CM | POA: Diagnosis not present

## 2022-11-02 DIAGNOSIS — D509 Iron deficiency anemia, unspecified: Secondary | ICD-10-CM | POA: Diagnosis not present

## 2022-11-02 DIAGNOSIS — E43 Unspecified severe protein-calorie malnutrition: Secondary | ICD-10-CM | POA: Diagnosis not present

## 2022-11-02 DIAGNOSIS — Z515 Encounter for palliative care: Secondary | ICD-10-CM | POA: Diagnosis not present

## 2022-11-02 DIAGNOSIS — I10 Essential (primary) hypertension: Secondary | ICD-10-CM

## 2022-11-02 DIAGNOSIS — Z79899 Other long term (current) drug therapy: Secondary | ICD-10-CM | POA: Diagnosis not present

## 2022-11-02 DIAGNOSIS — M6281 Muscle weakness (generalized): Secondary | ICD-10-CM | POA: Diagnosis not present

## 2022-11-02 DIAGNOSIS — I959 Hypotension, unspecified: Secondary | ICD-10-CM | POA: Diagnosis not present

## 2022-11-02 DIAGNOSIS — L89303 Pressure ulcer of unspecified buttock, stage 3: Secondary | ICD-10-CM | POA: Insufficient documentation

## 2022-11-02 DIAGNOSIS — Z7401 Bed confinement status: Secondary | ICD-10-CM | POA: Diagnosis not present

## 2022-11-02 DIAGNOSIS — Z96642 Presence of left artificial hip joint: Secondary | ICD-10-CM | POA: Diagnosis not present

## 2022-11-02 DIAGNOSIS — E878 Other disorders of electrolyte and fluid balance, not elsewhere classified: Secondary | ICD-10-CM | POA: Diagnosis present

## 2022-11-02 DIAGNOSIS — N184 Chronic kidney disease, stage 4 (severe): Secondary | ICD-10-CM | POA: Diagnosis not present

## 2022-11-02 DIAGNOSIS — I129 Hypertensive chronic kidney disease with stage 1 through stage 4 chronic kidney disease, or unspecified chronic kidney disease: Secondary | ICD-10-CM | POA: Diagnosis not present

## 2022-11-02 DIAGNOSIS — I440 Atrioventricular block, first degree: Secondary | ICD-10-CM | POA: Diagnosis not present

## 2022-11-02 DIAGNOSIS — L89150 Pressure ulcer of sacral region, unstageable: Secondary | ICD-10-CM | POA: Diagnosis not present

## 2022-11-02 DIAGNOSIS — N189 Chronic kidney disease, unspecified: Secondary | ICD-10-CM | POA: Diagnosis not present

## 2022-11-02 DIAGNOSIS — Z681 Body mass index (BMI) 19 or less, adult: Secondary | ICD-10-CM | POA: Diagnosis not present

## 2022-11-02 DIAGNOSIS — E872 Acidosis, unspecified: Secondary | ICD-10-CM | POA: Diagnosis not present

## 2022-11-02 DIAGNOSIS — Z923 Personal history of irradiation: Secondary | ICD-10-CM | POA: Diagnosis not present

## 2022-11-02 LAB — CBC
HCT: 25 % — ABNORMAL LOW (ref 36.0–46.0)
Hemoglobin: 8.3 g/dL — ABNORMAL LOW (ref 12.0–15.0)
MCH: 25.9 pg — ABNORMAL LOW (ref 26.0–34.0)
MCHC: 33.2 g/dL (ref 30.0–36.0)
MCV: 77.9 fL — ABNORMAL LOW (ref 80.0–100.0)
Platelets: 614 10*3/uL — ABNORMAL HIGH (ref 150–400)
RBC: 3.21 MIL/uL — ABNORMAL LOW (ref 3.87–5.11)
RDW: 17.3 % — ABNORMAL HIGH (ref 11.5–15.5)
WBC: 18.8 10*3/uL — ABNORMAL HIGH (ref 4.0–10.5)
nRBC: 0 % (ref 0.0–0.2)

## 2022-11-02 LAB — COMPREHENSIVE METABOLIC PANEL
ALT: 16 U/L (ref 0–44)
AST: 16 U/L (ref 15–41)
Albumin: 2.1 g/dL — ABNORMAL LOW (ref 3.5–5.0)
Alkaline Phosphatase: 65 U/L (ref 38–126)
Anion gap: 10 (ref 5–15)
BUN: 31 mg/dL — ABNORMAL HIGH (ref 8–23)
CO2: 16 mmol/L — ABNORMAL LOW (ref 22–32)
Calcium: 8.2 mg/dL — ABNORMAL LOW (ref 8.9–10.3)
Chloride: 108 mmol/L (ref 98–111)
Creatinine, Ser: 2.07 mg/dL — ABNORMAL HIGH (ref 0.44–1.00)
GFR, Estimated: 21 mL/min — ABNORMAL LOW (ref >60)
Glucose, Bld: 102 mg/dL — ABNORMAL HIGH (ref 70–99)
Potassium: 3.5 mmol/L (ref 3.5–5.1)
Sodium: 134 mmol/L — ABNORMAL LOW (ref 135–145)
Total Bilirubin: 0.6 mg/dL (ref 0.3–1.2)
Total Protein: 5.3 g/dL — ABNORMAL LOW (ref 6.5–8.1)

## 2022-11-02 LAB — URINALYSIS, ROUTINE W REFLEX MICROSCOPIC
Bilirubin Urine: NEGATIVE
Glucose, UA: NEGATIVE mg/dL
Hgb urine dipstick: NEGATIVE
Ketones, ur: NEGATIVE mg/dL
Nitrite: NEGATIVE
Protein, ur: 30 mg/dL — AB
Specific Gravity, Urine: 1.009 (ref 1.005–1.030)
Squamous Epithelial / HPF: NONE SEEN /HPF (ref 0–5)
pH: 5 (ref 5.0–8.0)

## 2022-11-02 LAB — HEPATIC FUNCTION PANEL
ALT: 17 U/L (ref 0–44)
AST: 22 U/L (ref 15–41)
Albumin: 2.3 g/dL — ABNORMAL LOW (ref 3.5–5.0)
Alkaline Phosphatase: 69 U/L (ref 38–126)
Bilirubin, Direct: 0.1 mg/dL (ref 0.0–0.2)
Indirect Bilirubin: 0.6 mg/dL (ref 0.3–0.9)
Total Bilirubin: 0.7 mg/dL (ref 0.3–1.2)
Total Protein: 5.7 g/dL — ABNORMAL LOW (ref 6.5–8.1)

## 2022-11-02 MED ORDER — SODIUM CHLORIDE 0.9 % IV SOLN: INTRAVENOUS | Status: DC

## 2022-11-02 MED ORDER — CALCITRIOL 0.25 MCG PO CAPS
0.2500 ug | ORAL_CAPSULE | Freq: Every day | ORAL | Status: DC
  Administered 2022-11-02 – 2022-11-08 (×7): 0.25 ug via ORAL
  Filled 2022-11-02 (×8): qty 1

## 2022-11-02 MED ORDER — MAGNESIUM SULFATE 2 GM/50ML IV SOLN
2.0000 g | Freq: Once | INTRAVENOUS | Status: AC
  Administered 2022-11-02: 2 g via INTRAVENOUS
  Filled 2022-11-02: qty 50

## 2022-11-02 MED ORDER — SODIUM BICARBONATE 650 MG PO TABS
650.0000 mg | ORAL_TABLET | Freq: Two times a day (BID) | ORAL | Status: DC
  Administered 2022-11-02: 650 mg via ORAL
  Filled 2022-11-02: qty 1

## 2022-11-02 MED ORDER — SUCRALFATE 1 G PO TABS
1.0000 g | ORAL_TABLET | Freq: Three times a day (TID) | ORAL | Status: DC
  Administered 2022-11-02 – 2022-11-08 (×25): 1 g via ORAL
  Filled 2022-11-02 (×25): qty 1

## 2022-11-02 MED ORDER — FAMOTIDINE IN NACL 20-0.9 MG/50ML-% IV SOLN
20.0000 mg | Freq: Two times a day (BID) | INTRAVENOUS | Status: DC
  Administered 2022-11-02 (×2): 20 mg via INTRAVENOUS
  Filled 2022-11-02 (×2): qty 50

## 2022-11-02 MED ORDER — MEDIHONEY WOUND/BURN DRESSING EX PSTE
1.0000 | PASTE | Freq: Every day | CUTANEOUS | Status: DC
  Administered 2022-11-02 – 2022-11-08 (×7): 1 via TOPICAL
  Filled 2022-11-02: qty 44

## 2022-11-02 MED ORDER — HEPARIN SODIUM (PORCINE) 5000 UNIT/ML IJ SOLN
5000.0000 [IU] | Freq: Three times a day (TID) | INTRAMUSCULAR | Status: DC
  Administered 2022-11-02 – 2022-11-08 (×19): 5000 [IU] via SUBCUTANEOUS
  Filled 2022-11-02 (×18): qty 1

## 2022-11-02 MED ORDER — VANCOMYCIN HCL 125 MG PO CAPS: 125.0000 mg | ORAL_CAPSULE | Freq: Four times a day (QID) | ORAL | Status: DC

## 2022-11-02 MED ORDER — FAMOTIDINE 20 MG PO TABS
10.0000 mg | ORAL_TABLET | Freq: Every day | ORAL | Status: DC
  Administered 2022-11-03 – 2022-11-08 (×6): 10 mg via ORAL
  Filled 2022-11-02 (×6): qty 1

## 2022-11-02 MED ORDER — FIDAXOMICIN 200 MG PO TABS
200.0000 mg | ORAL_TABLET | Freq: Two times a day (BID) | ORAL | Status: DC
  Administered 2022-11-02 – 2022-11-08 (×13): 200 mg via ORAL
  Filled 2022-11-02 (×13): qty 1

## 2022-11-02 MED ORDER — HYDRALAZINE HCL 50 MG PO TABS
25.0000 mg | ORAL_TABLET | Freq: Two times a day (BID) | ORAL | Status: DC
  Administered 2022-11-02 – 2022-11-08 (×13): 25 mg via ORAL
  Filled 2022-11-02 (×13): qty 1

## 2022-11-02 MED ORDER — ACETAMINOPHEN 650 MG RE SUPP: 650.0000 mg | Freq: Four times a day (QID) | RECTAL | Status: DC | PRN

## 2022-11-02 MED ORDER — POTASSIUM CHLORIDE 10 MEQ/100ML IV SOLN
10.0000 meq | INTRAVENOUS | Status: AC
  Administered 2022-11-02 (×5): 10 meq via INTRAVENOUS
  Filled 2022-11-02 (×4): qty 100

## 2022-11-02 MED ORDER — KCL IN DEXTROSE-NACL 20-5-0.45 MEQ/L-%-% IV SOLN
INTRAVENOUS | Status: AC
  Filled 2022-11-02 (×3): qty 1000

## 2022-11-02 MED ORDER — ACETAMINOPHEN 325 MG PO TABS
650.0000 mg | ORAL_TABLET | Freq: Four times a day (QID) | ORAL | Status: DC | PRN
  Administered 2022-11-02 – 2022-11-08 (×7): 650 mg via ORAL
  Filled 2022-11-02 (×7): qty 2

## 2022-11-02 MED ORDER — DILTIAZEM HCL ER COATED BEADS 240 MG PO CP24
240.0000 mg | ORAL_CAPSULE | Freq: Every day | ORAL | Status: DC
  Administered 2022-11-02 – 2022-11-08 (×7): 240 mg via ORAL
  Filled 2022-11-02 (×7): qty 1

## 2022-11-02 MED ORDER — SODIUM CHLORIDE 0.9% FLUSH
3.0000 mL | Freq: Two times a day (BID) | INTRAVENOUS | Status: DC
  Administered 2022-11-02 – 2022-11-08 (×13): 3 mL via INTRAVENOUS

## 2022-11-02 MED ORDER — VANCOMYCIN HCL 125 MG PO CAPS
125.0000 mg | ORAL_CAPSULE | Freq: Four times a day (QID) | ORAL | Status: DC
  Filled 2022-11-02 (×2): qty 1

## 2022-11-02 NOTE — TOC Initial Note (Signed)
Transition of Care [facility]) - Initial/Assessment Note    Patient Details  Name: [name] MRN: [id number] Date of Birth: Jan 02, 1919  Transition of Care [facility]) CM/SW Contact:    Kemper Durie, RN Phone Number: 11/02/2022, 1:58 PM  Clinical Narrative:                  Spoke with son [name], patient was admitted to hospital from [facility].  Report the plan was for patient to be discharged home on Tuesday after air mattress was delivered for her hospital bed.  They already had [facility] set up for [facility], this was confirmed with Barbara Cower.  Patient sees Dr. Ouida Sills for PCP, uses [facility] in [place] for medications.  Son lives next door, stays with patient at night and has caregivers all during the day.  Son would like for patient to be discharged directly home instead of back to facility on Tuesday, will need EMS transport.    Expected Discharge Plan: Home w Home Health Services Barriers to Discharge: Barriers Resolved   Patient Goals and CMS Choice Patient states their goals for this hospitalization and ongoing recovery are:: Home with family support   Choice offered to / list presented to : Adult Children      Expected Discharge Plan and Services     Post Acute Care Choice: Home Health Living arrangements for the past 2 months: Single Family Home                           HH Arranged: PT, OT, RN [facility] Agency: [facility] Date HH Agency Contacted: 11/02/22 Time HH Agency Contacted: 1357 Representative spoke with at [facility] Agency: Barbara Cower  Prior Living Arrangements/Services Living arrangements for the past 2 months: Single Family Home Lives with:: Self Patient language and need for interpreter reviewed:: Yes Do you feel safe going back to the place where you live?: Yes      Need for Family Participation in Patient Care: Yes (Comment) Care giver support system in place?: Yes (comment)   Criminal Activity/Legal Involvement Pertinent to Current  Situation/Hospitalization: No - Comment as needed  Activities of Daily Living Home Assistive Devices/Equipment: Eyeglasses, Dentures (specify type), Hearing aid (upper dentures) ADL Screening (condition at time of admission) Patient's cognitive ability adequate to safely complete daily activities?: No Is the patient deaf or have difficulty hearing?: Yes Does the patient have difficulty seeing, even when wearing glasses/contacts?: Yes Does the patient have difficulty concentrating, remembering, or making decisions?: No Patient able to express need for assistance with ADLs?: Yes Does the patient have difficulty dressing or bathing?: Yes Independently performs ADLs?: No Communication: Independent Dressing (OT): Needs assistance Is this a change from baseline?: Pre-admission baseline Grooming: Needs assistance Is this a change from baseline?: Pre-admission baseline Feeding: Needs assistance Is this a change from baseline?: Pre-admission baseline Bathing: Dependent Is this a change from baseline?: Pre-admission baseline Toileting: Dependent Is this a change from baseline?: Pre-admission baseline In/Out Bed: Dependent Is this a change from baseline?: Pre-admission baseline Walks in Home: Dependent Is this a change from baseline?: Pre-admission baseline Does the patient have difficulty walking or climbing stairs?: Yes Weakness of Legs: Both Weakness of Arms/Hands: None  Permission Sought/Granted Permission sought to share information with : Family Supports Permission granted to share information with : Yes, Verbal Permission Granted  Share Information with NAME: [name]  Permission granted to share info w AGENCY: [facility]  Permission granted to share info w  Relationship: son  Permission granted to share info w Contact Information: [phone]  Emotional Assessment           Psych Involvement: No (comment)  Admission diagnosis:  Hypokalemia [E87.6] Hypomagnesemia  [E83.42] Abnormal blood electrolyte level [E87.8] Acute kidney injury [facility]) [N17.9] Patient Active Problem List   Diagnosis Date Noted   Hypokalemia 11/02/2022   Hyponatremia 11/02/2022   Acute kidney injury superimposed on CKD (HCC) 11/02/2022   Anemia 11/02/2022   Abnormal blood electrolyte level 11/02/2022   Acute kidney injury (HCC) 11/02/2022   C. difficile colitis 10/24/2022   Sacral wound 10/23/2022   Generalized weakness 10/23/2022   Infected decubitus ulcer 10/21/2022   CKD (chronic kidney disease) stage 4, GFR 15-29 ml/min (HCC) 10/21/2022   Chronic diarrhea 10/21/2022   Right rotator cuff tear 01/04/2013   Pain in joint, shoulder region 01/04/2013   Muscle weakness (generalized) 01/04/2013   Abdominal pain 12/14/2012   Blood in the urine 12/14/2012   Buzzing in ear 12/14/2012   Calculus of kidney 12/14/2012   Essential (primary) hypertension 12/14/2012   Pulmonary embolism (HCC) 12/14/2012   PNA (pneumonia) 12/14/2012   PCP:  Carylon Perches, MD Pharmacy:   [phone] - [place], [place] - [address] [place] [address] Phone: [phone] Fax: [phone]     Social Determinants of Health (SDOH) Social History: SDOH Screenings   Food Insecurity: No Food Insecurity (11/02/2022)  Housing: Low Risk  (11/02/2022)  Transportation Needs: No Transportation Needs (11/02/2022)  Utilities: Not At Risk (11/02/2022)  Alcohol Screen: Low Risk  (09/17/2022)  Depression (PHQ2-9): Low Risk  (09/17/2022)  Financial Resource Strain: Low Risk  (09/17/2022)  Physical Activity: Insufficiently Active (09/17/2022)  Social Connections: Moderately Integrated (09/17/2022)  Stress: No Stress Concern Present (09/17/2022)  Tobacco Use: Low Risk  (11/01/2022)   SDOH Interventions:     Readmission Risk Interventions     No data to display

## 2022-11-02 NOTE — Plan of Care (Signed)

## 2022-11-02 NOTE — Consult Note (Signed)
WOC Nurse Consult Note: Reason for Consult:Stage 3 pressure injury to left sacrum. Had surgical debridement at the bedside by Dr. Lovell Sheehan on 6/26. Also seen by [facility] nursing on that day. Per photo comparison, wound is much cleaner. Wound type:pressure Pressure Injury POA: Yes Measurement:Per Nursing Flow Sheet, 4.3cm x 5cm x 1cm with pale pink, moist wound bed. Small drainage, serous. Wound bed:As noted above Drainage (amount, consistency, odor) As noted above Periwound:intact Dressing procedure/placement/frequency: I will continue the leptospermum Manuka honey that was used last admission and also the heel flotation and turning and repositioning to minimize time in the supine position.  WOC nursing team will not follow, but will remain available to this patient, the nursing and medical teams.  Please re-consult if needed.  Thank you for inviting [place] to participate in this patient's Plan of Care.  Ladona Mow, MSN, RN, CNS, GNP, Leda Min, [employer], [employer] phone:  [phone]

## 2022-11-02 NOTE — Progress Notes (Signed)
Progress Note   Patient: [name]:[id number] DOB: [birth year] DOA: 11/01/2022     0 DOS: the patient was seen and examined on 11/02/2022   Brief hospital course: 89 y.o. female with medical history significant for hypertension, embolism, SBO coming to [place] for abnormal electrolytes.  Patient was admitted on June 2024 for generalized weakness found to have a C. difficile infection.  Patient was discharged with p.o. vancomycin.  Patient is essentially bedridden and has an extensive infected decubitus ulcer has been unstageable.  Patient has been taking her vancomycin for C. difficile infection.  Per chart review patient has an infected sacral decub that was negative for osteomyelitis patient was discharged with 4 days of Doxy and cefdinir which she has completed.   The emergency room today patient is afebrile O2 sats of 100% on room air. Blood pressure stable at 144/53.  EKG shows sinus rhythm at 94 with a first-degree AV heart block and a PR interval of 254. Metabolic panel shows hypokalemia metabolic acidosis AKI hypomagnesemia CBC shows leukocytosis of 18.3 hemoglobin of 8.4 platelet counts of 626. Patient received supplementation for potassium and magnesium replacement in the emergency room.  7/6.  Creatinine still elevated at 2.07.  Will continue IV fluids overnight.  Add sodium bicarb tablets.  Change p.o. vancomycin over to Dificid.  Assessment and Plan: * Acute kidney injury superimposed on CKD (HCC) Acute kidney injury on CKD stage IV.  Baseline creatinine around 1.53.  Creatinine on admission 2.24 and creatinine came back to 2.07.  Add sodium bicarb tablets.   Abnormal blood electrolyte level Patient found to have hypokalemia hypomagnesemia hyponatremia secondary to decreased p.o. intake and GI losses.  Potassium up to 3.5.  Sodium 134.  Recheck electrolytes tomorrow.   C. difficile colitis Change p.o. vancomycin to Dificid.   Decubitus ulcer of buttock, stage 3  (HCC) Present on admission.  Does not look infected.  Buttock/sacral decubiti.  Appreciate wound care evaluation.  Anemia Anemia is chronic.  Will check a ferritin tomorrow morning.  Last hemoglobin 8.3  Hypokalemia    Essential (primary) hypertension Continue patient on diltiazem and hydralazine.         Subjective: Patient feels okay.  Admitted with abnormal electrolytes and acute kidney injury.  Physical Exam: Vitals:   11/02/22 0819 11/02/22 0945 11/02/22 1045 11/02/22 1200  BP: (!) 131/45   (!) 149/43  Pulse: 81   84  Resp: 18 (!) 22 17 18   Temp: 98 F (36.7 C)   98 F (36.7 C)  TempSrc:      SpO2: 100%   100%  Weight:      Height:       Physical Exam HENT:     Head: Normocephalic.     Mouth/Throat:     Pharynx: No oropharyngeal exudate.  Eyes:     General: Lids are normal.     Conjunctiva/sclera: Conjunctivae normal.  Cardiovascular:     Rate and Rhythm: Normal rate and regular rhythm.     Heart sounds: Normal heart sounds, S1 normal and S2 normal.  Pulmonary:     Breath sounds: No decreased breath sounds, wheezing, rhonchi or rales.  Abdominal:     Palpations: Abdomen is soft.     Tenderness: There is no abdominal tenderness.  Musculoskeletal:     Right lower leg: No swelling.     Left lower leg: No swelling.  Skin:    General: Skin is warm.     Comments: Stage III decubitus on  left buttock/sacral area  Neurological:     Mental Status: She is alert and oriented to person, place, and time.     Data Reviewed: Sodium 134, creatinine 2.07, CO2 16, white blood cell count 18.8, hemoglobin 8.3, platelet count 614  Family Communication: Spoke with son and daughter-in-law at the bedside  Disposition: Status is: Inpatient Remains inpatient appropriate because: Patient still has acute kidney injury with acidosis.  Patient also has leukocytosis.  Planned Discharge Destination: Home with Home Health    Time spent: 28 minutes  Author: Alford Highland, MD 11/02/2022 2:56 PM  For on call review [email or web address].

## 2022-11-02 NOTE — Assessment & Plan Note (Addendum)
Patient found to have hypokalemia hypomagnesemia hyponatremia secondary to decreased p.o. intake and GI losses.  Potassium up to 3.8.  Sodium 138.  Phosphorus 4.0, magnesium 2.1.  Continue potassium replacement with diarrhea.

## 2022-11-02 NOTE — Assessment & Plan Note (Deleted)
Anemia is chronic.  Will check a ferritin tomorrow morning.  Last hemoglobin 8.3

## 2022-11-02 NOTE — Assessment & Plan Note (Addendum)
Changed p.o. vancomycin to Dificid on 7/6.  Will need to do a cost analysis tomorrow.

## 2022-11-02 NOTE — Assessment & Plan Note (Signed)
Present on admission.  Does not look infected.  Buttock/sacral decubiti.  Appreciate wound care evaluation.

## 2022-11-02 NOTE — Assessment & Plan Note (Addendum)
Continue patient on diltiazem and hydralazine.

## 2022-11-02 NOTE — Progress Notes (Signed)
PHARMACIST - PHYSICIAN COMMUNICATION  CONCERNING: IV to Oral Route Change Policy  RECOMMENDATION: This patient is receiving famotidine by the intravenous route.  Based on criteria approved by the Pharmacy and Therapeutics Committee, the intravenous medication(s) is/are being converted to the equivalent oral dose form(s).  Dose adjusted per indication / renal function Famotidine 20 mg BID >> 10 mg daily  DESCRIPTION: These criteria include: The patient is eating (either orally or via tube) and/or has been taking other orally administered medications for a least 24 hours The patient has no evidence of active gastrointestinal bleeding or impaired GI absorption (gastrectomy, short bowel, patient on TNA or NPO).  If you have questions about this conversion, please contact the Pharmacy Department   Tressie Ellis, [facility] 11/02/2022 11:41 AM

## 2022-11-02 NOTE — Hospital Course (Signed)
89 y.o. female with medical history significant for hypertension, embolism, SBO coming to [place] for abnormal electrolytes.  Patient was admitted on June 2024 for generalized weakness found to have a C. difficile infection.  Patient was discharged with p.o. vancomycin.  Patient is essentially bedridden and has an extensive infected decubitus ulcer has been unstageable.  Patient has been taking her vancomycin for C. difficile infection.  Per chart review patient has an infected sacral decub that was negative for osteomyelitis patient was discharged with 4 days of Doxy and cefdinir which she has completed.   The emergency room today patient is afebrile O2 sats of 100% on room air. Blood pressure stable at 144/53.  EKG shows sinus rhythm at 94 with a first-degree AV heart block and a PR interval of 254. Metabolic panel shows hypokalemia metabolic acidosis AKI hypomagnesemia CBC shows leukocytosis of 18.3 hemoglobin of 8.4 platelet counts of 626. Patient received supplementation for potassium and magnesium replacement in the emergency room.  7/6.  Creatinine still elevated at 2.07.  Will continue IV fluids overnight.  Add sodium bicarb tablets.  Change p.o. vancomycin over to Dificid. 7/7.  Hemoglobin drifted down to 7.6 and creatinine lowered to 1.93.  Discontinued fluids.  Continue sodium bicarb tablets. 7/8.  Hemoglobin 7.7, creatinine 2.13.  Will continue sodium bicarb tablets at increased dose.  Will give fluid bolus. 7/9.  Creatinine 1.87.  Hemoglobin 7.6.  Still waiting on bed to be delivered.

## 2022-11-02 NOTE — H&P (Signed)
History and Physical    Patient: [name]:[id number] DOB: December 02, 1918 DOA: 11/01/2022 DOS: the patient was seen and examined on 11/02/2022 PCP: Carylon Perches, MD  Patient coming from: Home   Chief Complaint:  Chief Complaint  Patient presents with   Abnormal Labs    HPI: [name] is a 89 y.o. female with medical history significant for hypertension, embolism, SBO coming to [place] for abnormal electrolytes.  Patient was admitted on June 2024 for generalized weakness found to have a C. difficile infection.  Patient was discharged with p.o. vancomycin.  Patient is essentially bedridden and has an extensive infected decubitus ulcer has been unstageable.  Patient has been taking her vancomycin for C. difficile infection.  Per chart review patient has an infected sacral decub that was negative for osteomyelitis patient was discharged with 4 days of Doxy and cefdinir which she has completed.  The emergency room today patient is afebrile O2 sats of 100% on room air. Blood pressure stable at 144/53.  EKG shows sinus rhythm at 94 with a first-degree AV heart block and a PR interval of 254. Metabolic panel shows hypokalemia metabolic acidosis AKI hypomagnesemia CBC shows leukocytosis of 18.3 hemoglobin of 8.4 platelet counts of 626. Patient received supplementation for potassium and magnesium replacement in the emergency room..  Review of Systems: Review of Systems  Unable to perform ROS: Age  Musculoskeletal:        Hand pain   Past Medical History:  Diagnosis Date   DDD (degenerative disc disease), lumbar    DVT (deep venous thrombosis) (HCC)    Hip fracture (HCC)    left   Hypertension    Osteoporosis    PE (pulmonary embolism)    SBO (small bowel obstruction) (HCC) 2007   Sigmoid diverticulosis    Uterine cancer (HCC) 1993   s/p radiation therapy and hysterectomy   Past Surgical History:  Procedure Laterality Date   ABDOMINAL ADHESION SURGERY  2007   Dr. Jamey Ripa    ABDOMINAL HYSTERECTOMY  1993   APPENDECTOMY  2007   BOWEL RESECTION  2007   terminal ileum (Dr. Jamey Ripa)   CHOLECYSTECTOMY  2006   HIP SURGERY Left 2007   Total hip replacement   Social History:  reports that she has never smoked. She has never been exposed to tobacco smoke. She has never used smokeless tobacco. She reports that she does not drink alcohol and does not use drugs.  No Known Allergies  History reviewed. No pertinent family history.  Prior to Admission medications   Medication Sig Start Date End Date Taking? Authorizing Provider  calcitRIOL (ROCALTROL) 0.25 MCG capsule Take 0.25 mcg by mouth daily. 10/03/22   [provider]  cefdinir (OMNICEF) 300 MG capsule Take 1 capsule (300 mg total) by mouth daily at 2 PM. X 4 days 10/25/22   Tat, Onalee Hua, MD  diltiazem (CARDIZEM CD) 240 MG 24 hr capsule Take 240 mg by mouth daily. 07/23/22   [provider]  doxycycline (VIBRA-TABS) 100 MG tablet Take 1 tablet (100 mg total) by mouth every 12 (twelve) hours. X 4 days 10/25/22   Catarina Hartshorn, MD  furosemide (LASIX) 20 MG tablet Take 20 mg by mouth daily. 08/31/22   [provider]  hydrALAZINE (APRESOLINE) 25 MG tablet Take 25 mg by mouth 2 (two) times daily. 07/27/22   [provider]  vancomycin (VANCOCIN) 125 MG capsule Take 1 capsule (125 mg total) by mouth 4 (four) times daily. X 9 days 10/25/22  Catarina Hartshorn, MD   Vitals:   11/01/22 2330 11/02/22 0000 11/02/22 0030 11/02/22 0100  BP: (!) 137/45  (!) 134/48 (!) 133/47  Pulse: 87 81 87 88  Resp: (!) 21 (!) 21 19 18   Temp:      TempSrc:      SpO2: 100% 100% 98% 100%   Physical Exam Vitals and nursing note reviewed.  Constitutional:      General: She is not in acute distress. HENT:     Head: Normocephalic and atraumatic.     Right Ear: Hearing normal.     Left Ear: Hearing normal.     Nose: Nose normal. No nasal deformity.     Mouth/Throat:     Lips: Pink.     Tongue: No lesions.     Pharynx:  Oropharynx is clear.  Eyes:     General: Lids are normal.     Extraocular Movements: Extraocular movements intact.  Cardiovascular:     Rate and Rhythm: Normal rate and regular rhythm.     Heart sounds: Normal heart sounds.  Pulmonary:     Effort: Pulmonary effort is normal.     Breath sounds: Normal breath sounds.  Abdominal:     General: Bowel sounds are normal. There is no distension.     Palpations: Abdomen is soft. There is no mass.     Tenderness: There is no abdominal tenderness.  Musculoskeletal:     Right lower leg: No edema.     Left lower leg: No edema.  Skin:    General: Skin is warm.  Neurological:     General: No focal deficit present.     Mental Status: She is alert and oriented to person, place, and time.     Cranial Nerves: Cranial nerves 2-12 are intact.  Psychiatric:        Attention and Perception: Attention normal.        Mood and Affect: Mood normal.        Speech: Speech normal.        Behavior: Behavior normal. Behavior is cooperative.     Labs on Admission: I have personally reviewed following labs and imaging studies  CBC: Recent Labs  Lab 11/01/22 2324  WBC 18.3*  NEUTROABS 13.2*  HGB 8.4*  HCT 25.4*  MCV 78.4*  PLT 626*   Basic Metabolic Panel: Recent Labs  Lab 11/01/22 2324  NA 134*  K 2.0*  CL 107  CO2 15*  GLUCOSE 109*  BUN 31*  CREATININE 2.24*  CALCIUM 8.4*  MG 1.6*   GFR: Estimated Creatinine Clearance: 9.1 mL/min (A) (by C-G formula based on SCr of 2.24 mg/dL (H)). Liver Function Tests: Recent Labs  Lab 11/01/22 2324  AST 22  ALT 17  ALKPHOS 69  BILITOT 0.7  PROT 5.7*  ALBUMIN 2.3*   No results for input(s): "LIPASE", "AMYLASE" in the last 168 hours. No results for input(s): "AMMONIA" in the last 168 hours. Coagulation Profile: No results for input(s): "INR", "PROTIME" in the last 168 hours. Cardiac Enzymes: No results for input(s): "CKTOTAL", "CKMB", "CKMBINDEX", "TROPONINI" in the last 168 hours. BNP  (last 3 results) No results for input(s): "PROBNP" in the last 8760 hours. HbA1C: No results for input(s): "HGBA1C" in the last 72 hours. CBG: No results for input(s): "GLUCAP" in the last 168 hours. Lipid Profile: No results for input(s): "CHOL", "HDL", "LDLCALC", "TRIG", "CHOLHDL", "LDLDIRECT" in the last 72 hours. Thyroid Function Tests: No results for input(s): "TSH", "T4TOTAL", "FREET4", "T3FREE", "THYROIDAB"  in the last 72 hours. Anemia Panel: No results for input(s): "VITAMINB12", "FOLATE", "FERRITIN", "TIBC", "IRON", "RETICCTPCT" in the last 72 hours. Urine analysis: Urinalysis    Component Value Date/Time   COLORURINE YELLOW 10/21/2022 2200   APPEARANCEUR CLEAR 10/21/2022 2200   LABSPEC 1.012 10/21/2022 2200   PHURINE 7.0 10/21/2022 2200   GLUCOSEU NEGATIVE 10/21/2022 2200   HGBUR NEGATIVE 10/21/2022 2200   BILIRUBINUR NEGATIVE 10/21/2022 2200   KETONESUR NEGATIVE 10/21/2022 2200   PROTEINUR 30 (A) 10/21/2022 2200   UROBILINOGEN 0.2 08/16/2012 0843   NITRITE NEGATIVE 10/21/2022 2200   LEUKOCYTESUR LARGE (A) 10/21/2022 2200   Radiological Exams on Admission: No results found.  Data Reviewed: Relevant notes from primary care and specialist visits, past discharge summaries as available in EHR, including Care Everywhere. Prior diagnostic testing as pertinent to current admission diagnoses Updated medications and problem lists for reconciliation ED course, including vitals, labs, imaging, treatment and response to treatment Triage notes, nursing and pharmacy notes and ED provider's notes Notable results as noted in HPI Assessment and Plan: Abnormal blood electrolyte level Patient found to have hypokalemia hypomagnesemia hyponatremia secondary to decreased p.o. intake and GI losses.  Patient is also on diuretic therapy. Will replace and follow levels.   Anemia Anemia is chronic.  Currently hemoglobin is stable at 8.4.  Will type and screen, p.o. Pepcid and  Carafate.  Acute kidney injury superimposed on CKD [facility]) Lab Results  Component Value Date   CREATININE 2.24 (H) 11/01/2022   CREATININE 1.53 (H) 10/25/2022   CREATININE 1.69 (H) 10/24/2022  Avoid contrast studies. Renally dose needed medications.   Hyponatremia Due to decreased p.o. intake and diarrhea. Cautious IV fluid hydration encourage p.o. intake.  With a soft diet.  Hypokalemia Patient started on IV replacement in the emergency room which we will continue and follow with a.m. labs.   C. difficile colitis Patient found to have C. difficile foundation and C. difficile PCR both positive. Plan to continue po vancomycin until July 7th.    Infected decubitus ulcer Unstageable, negative for osteomyelitis on MRI and patient discharged with cefdinir therapy that she has completed with wound care management recommendations as: Apply Medihoney to sacrum wound Q day, then cover with foam dressing. Change foam dressing Q 3 days or PRN soiling.   Pulmonary embolism (HCC) Patient is long longer on warfarin. Will manage patient on heparin 5000 Q8H DVT prophylaxis.  Essential (primary) hypertension Blood pressure is currently stable we will continue patient on diltiazem and hydralazine.    DVT prophylaxis:  Heparin   Consults:  None   Advance Care Planning:    Code Status: DNR  Family Communication:  None  Disposition Plan:  Back to previous home environment  Severity of Illness: The appropriate patient status for this patient is OBSERVATION. Observation status is judged to be reasonable and necessary in order to provide the required intensity of service to ensure the patient's safety. The patient's presenting symptoms, physical exam findings, and initial radiographic and laboratory data in the context of their medical condition is felt to place them at decreased risk for further clinical deterioration. Furthermore, it is anticipated that the patient will be medically stable  for discharge from the hospital within 2 midnights of admission.   Author: Gertha Calkin, MD 11/02/2022 1:56 AM  For on call review [email or web address].

## 2022-11-02 NOTE — Assessment & Plan Note (Deleted)
Unstageable, negative for osteomyelitis on MRI and patient discharged with cefdinir therapy that she has completed with wound care management recommendations as: Apply Medihoney to sacrum wound Q day, then cover with foam dressing. Change foam dressing Q 3 days or PRN soiling.

## 2022-11-02 NOTE — Assessment & Plan Note (Addendum)
Acute kidney injury on CKD stage IV.  Baseline creatinine around 1.53.  Creatinine on admission 2.24 and today's creatinine 2.13.  Continue sodium bicarb tablets.  Will give fluid bolus today.

## 2022-11-02 NOTE — Assessment & Plan Note (Deleted)
Patient is long longer on warfarin. Will manage patient on heparin 5000 Q8H DVT prophylaxis.

## 2022-11-02 NOTE — Assessment & Plan Note (Deleted)
Due to decreased p.o. intake and diarrhea. Cautious IV fluid hydration encourage p.o. intake.  With a soft diet.

## 2022-11-03 DIAGNOSIS — D509 Iron deficiency anemia, unspecified: Secondary | ICD-10-CM | POA: Insufficient documentation

## 2022-11-03 DIAGNOSIS — A0472 Enterocolitis due to Clostridium difficile, not specified as recurrent: Secondary | ICD-10-CM | POA: Diagnosis not present

## 2022-11-03 DIAGNOSIS — E878 Other disorders of electrolyte and fluid balance, not elsewhere classified: Secondary | ICD-10-CM | POA: Diagnosis not present

## 2022-11-03 DIAGNOSIS — N179 Acute kidney failure, unspecified: Secondary | ICD-10-CM | POA: Diagnosis not present

## 2022-11-03 DIAGNOSIS — D508 Other iron deficiency anemias: Secondary | ICD-10-CM

## 2022-11-03 DIAGNOSIS — E876 Hypokalemia: Secondary | ICD-10-CM | POA: Diagnosis not present

## 2022-11-03 LAB — CBC
HCT: 23.2 % — ABNORMAL LOW (ref 36.0–46.0)
Hemoglobin: 7.6 g/dL — ABNORMAL LOW (ref 12.0–15.0)
MCH: 25.8 pg — ABNORMAL LOW (ref 26.0–34.0)
MCHC: 32.8 g/dL (ref 30.0–36.0)
MCV: 78.6 fL — ABNORMAL LOW (ref 80.0–100.0)
Platelets: 614 10*3/uL — ABNORMAL HIGH (ref 150–400)
RBC: 2.95 MIL/uL — ABNORMAL LOW (ref 3.87–5.11)
RDW: 17.5 % — ABNORMAL HIGH (ref 11.5–15.5)
WBC: 16.3 10*3/uL — ABNORMAL HIGH (ref 4.0–10.5)
nRBC: 0 % (ref 0.0–0.2)

## 2022-11-03 LAB — BASIC METABOLIC PANEL
Anion gap: 9 (ref 5–15)
BUN: 29 mg/dL — ABNORMAL HIGH (ref 8–23)
CO2: 15 mmol/L — ABNORMAL LOW (ref 22–32)
Calcium: 8.1 mg/dL — ABNORMAL LOW (ref 8.9–10.3)
Chloride: 110 mmol/L (ref 98–111)
Creatinine, Ser: 1.93 mg/dL — ABNORMAL HIGH (ref 0.44–1.00)
GFR, Estimated: 23 mL/min — ABNORMAL LOW (ref >60)
Glucose, Bld: 97 mg/dL (ref 70–99)
Potassium: 3 mmol/L — ABNORMAL LOW (ref 3.5–5.1)
Sodium: 134 mmol/L — ABNORMAL LOW (ref 135–145)

## 2022-11-03 LAB — PHOSPHORUS: Phosphorus: 2.4 mg/dL — ABNORMAL LOW (ref 2.5–4.6)

## 2022-11-03 LAB — MAGNESIUM: Magnesium: 2.1 mg/dL (ref 1.7–2.4)

## 2022-11-03 LAB — FERRITIN: Ferritin: 52 ng/mL (ref 11–307)

## 2022-11-03 MED ORDER — ENSURE ENLIVE PO LIQD
237.0000 mL | Freq: Two times a day (BID) | ORAL | Status: DC
  Administered 2022-11-03 – 2022-11-08 (×7): 237 mL via ORAL

## 2022-11-03 MED ORDER — FERROUS SULFATE 325 (65 FE) MG PO TABS
325.0000 mg | ORAL_TABLET | Freq: Every day | ORAL | Status: DC
  Administered 2022-11-04 – 2022-11-08 (×5): 325 mg via ORAL
  Filled 2022-11-03 (×5): qty 1

## 2022-11-03 MED ORDER — FLUCONAZOLE IN SODIUM CHLORIDE 200-0.9 MG/100ML-% IV SOLN
200.0000 mg | Freq: Once | INTRAVENOUS | Status: AC
  Administered 2022-11-03: 200 mg via INTRAVENOUS
  Filled 2022-11-03: qty 100

## 2022-11-03 MED ORDER — POTASSIUM CHLORIDE CRYS ER 20 MEQ PO TBCR
40.0000 meq | EXTENDED_RELEASE_TABLET | Freq: Every day | ORAL | Status: DC
  Administered 2022-11-03: 40 meq via ORAL
  Filled 2022-11-03: qty 2

## 2022-11-03 MED ORDER — ZINC OXIDE 40 % EX OINT
TOPICAL_OINTMENT | CUTANEOUS | Status: DC | PRN
  Filled 2022-11-03 (×3): qty 113

## 2022-11-03 MED ORDER — K PHOS MONO-SOD PHOS DI & MONO 155-852-130 MG PO TABS
500.0000 mg | ORAL_TABLET | Freq: Two times a day (BID) | ORAL | Status: AC
  Administered 2022-11-03 (×2): 500 mg via ORAL
  Filled 2022-11-03 (×2): qty 2

## 2022-11-03 MED ORDER — FLUCONAZOLE 100MG IVPB
100.0000 mg | INTRAVENOUS | Status: DC
  Administered 2022-11-04 – 2022-11-06 (×3): 100 mg via INTRAVENOUS
  Filled 2022-11-03 (×4): qty 50

## 2022-11-03 MED ORDER — SODIUM BICARBONATE 650 MG PO TABS
650.0000 mg | ORAL_TABLET | Freq: Three times a day (TID) | ORAL | Status: DC
  Administered 2022-11-03 (×3): 650 mg via ORAL
  Filled 2022-11-03 (×3): qty 1

## 2022-11-03 MED ORDER — NYSTATIN 100000 UNIT/GM EX POWD
Freq: Three times a day (TID) | CUTANEOUS | Status: DC
  Filled 2022-11-03 (×2): qty 15

## 2022-11-03 MED ORDER — ZINC OXIDE 40 % EX OINT: TOPICAL_OINTMENT | CUTANEOUS | Status: DC | PRN

## 2022-11-03 MED ORDER — ZINC OXIDE 40 % EX OINT
TOPICAL_OINTMENT | Freq: Three times a day (TID) | CUTANEOUS | Status: DC | PRN
  Filled 2022-11-03: qty 113

## 2022-11-03 MED ORDER — CHLORHEXIDINE GLUCONATE CLOTH 2 % EX PADS
6.0000 | MEDICATED_PAD | Freq: Every day | CUTANEOUS | Status: DC
  Administered 2022-11-03 – 2022-11-08 (×6): 6 via TOPICAL

## 2022-11-03 NOTE — Consult Note (Signed)
WOC Nurse Consult Note: Reason for Consult:irritant contact dermatitis from fungal overgrowth and new onset loose stools Wound type:moisture  ICD-10 CM Codes for Irritant Dermatitis  L24A2 - Due to fecal, urinary or dual incontinence L24A9 - Due to friction or contact with other specified body fluids L30.4  - Erythema intertrigo. Also used for abrasion of the hand, chafing of the skin, dermatitis due to sweating and friction, friction dermatitis, friction eczema, and genital/thigh intertrigo.   Pressure Injury POA: Yes Measurement:Diffuse area of erythema and edema in the labial, medial thigh and vaginal vault, also perineal Wound EXB:[id number] open areas, pink, moist Drainage (amount, consistency, odor) serous Periwound:as noted above Dressing procedure/placement/frequency:I will provide a mattress replacement with low air loss feature, Patient is being turned and repositioned due to PI, but also for prompt cleansing due to fecal incontinence. Desitin cream to buttocks and medial and posterior thighs, InterDry antimicrobial moisture wicking textile is for inguinal areas and intra labial use.  The systemic antifungal (diflucan, IV) and nystatin topical will assist in managing irritant contact dermatitis. Appreciate the prompt interventions of Bedside RN D. Pinkerton.  WOC nursing team will not follow, but will remain available to this patient, the nursing and medical teams.  Please re-consult if needed.  Thank you for inviting [place] to participate in this patient's Plan of Care.  Ladona Mow, MSN, RN, CNS, GNP, Leda Min, [employer], [employer] phone:  [phone]

## 2022-11-03 NOTE — Assessment & Plan Note (Signed)
Hemoglobin drifted down to 7.7 with IV fluid hydration.  Continue oral iron.

## 2022-11-03 NOTE — Progress Notes (Signed)
PT Cancellation Note  Patient Details Name: VARONICA BRADBURN MRN: [id number] DOB: [birth year]   Cancelled Treatment:    Reason Eval/Treat Not Completed: Other (comment). PT orders received and pt chart reviewed. Therapy attempted x2 this morning. At 0845 pt requesting PT come back as breakfast tray had arrived with CNA help for eating. At 1131 pt requesting PT come back this afternoon due to [facility] and needed assist with pericare. PT offered assistance for cleaning, but pt deferred stating "the medicine they have me on to heal this wound makes me have diarrhea. It hurts so bad when they clean me up." RN secure messaged regarding issue. Will attempt therapy evaluation later today, with time permitting.   Vira Blanco, PT, DPT 12:14 PM,11/03/22 Physical Therapist - [facility]

## 2022-11-03 NOTE — Progress Notes (Signed)
Progress Note   Patient: [name]:[id number] DOB: 1918/12/31 DOA: 11/01/2022     1 DOS: the patient was seen and examined on 11/03/2022   Brief hospital course: 89 y.o. female with medical history significant for hypertension, embolism, SBO coming to [place] for abnormal electrolytes.  Patient was admitted on June 2024 for generalized weakness found to have a C. difficile infection.  Patient was discharged with p.o. vancomycin.  Patient is essentially bedridden and has an extensive infected decubitus ulcer has been unstageable.  Patient has been taking her vancomycin for C. difficile infection.  Per chart review patient has an infected sacral decub that was negative for osteomyelitis patient was discharged with 4 days of Doxy and cefdinir which she has completed.   The emergency room today patient is afebrile O2 sats of 100% on room air. Blood pressure stable at 144/53.  EKG shows sinus rhythm at 94 with a first-degree AV heart block and a PR interval of 254. Metabolic panel shows hypokalemia metabolic acidosis AKI hypomagnesemia CBC shows leukocytosis of 18.3 hemoglobin of 8.4 platelet counts of 626. Patient received supplementation for potassium and magnesium replacement in the emergency room.  7/6.  Creatinine still elevated at 2.07.  Will continue IV fluids overnight.  Add sodium bicarb tablets.  Change p.o. vancomycin over to Dificid. 7/7.  Hemoglobin drifted down to 7.6 and creatinine lowered to 1.93.  Discontinued fluids.  Continue sodium bicarb tablets.  Assessment and Plan: * Acute kidney injury superimposed on CKD (HCC) Acute kidney injury on CKD stage IV.  Baseline creatinine around 1.53.  Creatinine on admission 2.24 and today's creatinine 1.93.  Continue sodium bicarb tablets.   Abnormal blood electrolyte level Patient found to have hypokalemia hypomagnesemia hyponatremia secondary to decreased p.o. intake and GI losses.  Potassium up to 3.0.  Sodium 134.  Phosphorus 2.4,  magnesium 2.1.  Recheck electrolytes tomorrow.  Replace potassium orally and phosphorus orally today.   C. difficile colitis Changed p.o. vancomycin to Dificid on 7/6.  Will need to do a cost analysis tomorrow.   Iron deficiency anemia Hemoglobin drifted down to 7.6 with IV fluid hydration.  Discontinue IV fluids today.  Recheck hemoglobin tomorrow.  Start oral iron.  May end up needing a transfusion if hemoglobin drops lower.  Decubitus ulcer of buttock, stage 3 (HCC) Present on admission.  Does not look infected.  Buttock/sacral decubiti.  Appreciate wound care evaluation.  Hypokalemia    Essential (primary) hypertension Continue patient on diltiazem and hydralazine.        Subjective: Most of the patient's complaints were positional with the pillows in the bed.  Still having some diarrhea.  Rawness on her buttock.  Admitted with electrolyte abnormalities and acute kidney injury.  Poor appetite.  Physical Exam: Vitals:   11/02/22 2024 11/03/22 0108 11/03/22 0442 11/03/22 0807  BP: (!) 153/53  (!) 119/44 (!) 121/38  Pulse: 86  80 82  Resp: 16  20   Temp: 98.8 F (37.1 C)  (!) 97.5 F (36.4 C) 98.2 F (36.8 C)  TempSrc: Oral  Oral Oral  SpO2: 100%  99% 100%  Weight:  44.3 kg    Height:       Physical Exam HENT:     Head: Normocephalic.     Mouth/Throat:     Pharynx: No oropharyngeal exudate.  Eyes:     General: Lids are normal.     Conjunctiva/sclera: Conjunctivae normal.  Cardiovascular:     Rate and Rhythm: Normal rate and  regular rhythm.     Heart sounds: Normal heart sounds, S1 normal and S2 normal.  Pulmonary:     Breath sounds: No decreased breath sounds, wheezing, rhonchi or rales.  Abdominal:     Palpations: Abdomen is soft.     Tenderness: There is no abdominal tenderness.  Musculoskeletal:     Right lower leg: No swelling.     Left lower leg: No swelling.  Skin:    General: Skin is warm.     Comments: Stage III decubitus on left buttock/sacral  area.  Chronic lower extremity skin discoloration.  Neurological:     Mental Status: She is alert and oriented to person, place, and time.     Data Reviewed: Sodium 134, potassium 3.0, CO2 15, creatinine 1.93, phosphorus 2.4, magnesium 2.1, hemoglobin 7.6, white blood cell count 16.3, platelet count 614  Family Communication: Updated patient's son on the phone  Disposition: Status is: Inpatient Remains inpatient appropriate because: Patient's hospital bed will potentially be delivered home sometime tomorrow.  Will check labs tomorrow.  May end up needing to give a blood transfusion if hemoglobin drops down.  Planned Discharge Destination: Home with Home Health    Time spent: 28 minutes  Author: Alford Highland, MD 11/03/2022 12:27 PM  For on call review [email or web address].

## 2022-11-04 ENCOUNTER — Other Ambulatory Visit (HOSPITAL_COMMUNITY): Payer: Self-pay

## 2022-11-04 DIAGNOSIS — E878 Other disorders of electrolyte and fluid balance, not elsewhere classified: Secondary | ICD-10-CM | POA: Diagnosis not present

## 2022-11-04 DIAGNOSIS — E876 Hypokalemia: Secondary | ICD-10-CM | POA: Diagnosis not present

## 2022-11-04 DIAGNOSIS — A0472 Enterocolitis due to Clostridium difficile, not specified as recurrent: Secondary | ICD-10-CM | POA: Diagnosis not present

## 2022-11-04 DIAGNOSIS — N179 Acute kidney failure, unspecified: Secondary | ICD-10-CM | POA: Diagnosis not present

## 2022-11-04 LAB — BASIC METABOLIC PANEL
Anion gap: 6 (ref 5–15)
BUN: 31 mg/dL — ABNORMAL HIGH (ref 8–23)
CO2: 17 mmol/L — ABNORMAL LOW (ref 22–32)
Calcium: 8.6 mg/dL — ABNORMAL LOW (ref 8.9–10.3)
Chloride: 115 mmol/L — ABNORMAL HIGH (ref 98–111)
Creatinine, Ser: 2.13 mg/dL — ABNORMAL HIGH (ref 0.44–1.00)
GFR, Estimated: 20 mL/min — ABNORMAL LOW (ref >60)
Glucose, Bld: 106 mg/dL — ABNORMAL HIGH (ref 70–99)
Potassium: 3.8 mmol/L (ref 3.5–5.1)
Sodium: 138 mmol/L (ref 135–145)

## 2022-11-04 LAB — CBC
HCT: 23.9 % — ABNORMAL LOW (ref 36.0–46.0)
Hemoglobin: 7.7 g/dL — ABNORMAL LOW (ref 12.0–15.0)
MCH: 26.2 pg (ref 26.0–34.0)
MCHC: 32.2 g/dL (ref 30.0–36.0)
MCV: 81.3 fL (ref 80.0–100.0)
Platelets: 575 10*3/uL — ABNORMAL HIGH (ref 150–400)
RBC: 2.94 MIL/uL — ABNORMAL LOW (ref 3.87–5.11)
RDW: 17.9 % — ABNORMAL HIGH (ref 11.5–15.5)
WBC: 17.1 10*3/uL — ABNORMAL HIGH (ref 4.0–10.5)
nRBC: 0 % (ref 0.0–0.2)

## 2022-11-04 LAB — TYPE AND SCREEN
ABO/RH(D): AB POS
Antibody Screen: NEGATIVE

## 2022-11-04 LAB — GLUCOSE, CAPILLARY
Glucose-Capillary: 116 mg/dL — ABNORMAL HIGH (ref 70–99)
Glucose-Capillary: 94 mg/dL (ref 70–99)

## 2022-11-04 LAB — PHOSPHORUS: Phosphorus: 4 mg/dL (ref 2.5–4.6)

## 2022-11-04 MED ORDER — SODIUM CHLORIDE 0.9 % IV BOLUS: 250.0000 mL | Freq: Once | INTRAVENOUS | Status: DC

## 2022-11-04 MED ORDER — SODIUM CHLORIDE 0.9 % IV BOLUS
250.0000 mL | Freq: Once | INTRAVENOUS | Status: AC
  Administered 2022-11-04: 250 mL via INTRAVENOUS

## 2022-11-04 MED ORDER — POTASSIUM CHLORIDE CRYS ER 20 MEQ PO TBCR
20.0000 meq | EXTENDED_RELEASE_TABLET | Freq: Every day | ORAL | Status: DC
  Administered 2022-11-04 – 2022-11-08 (×5): 20 meq via ORAL
  Filled 2022-11-04 (×5): qty 1

## 2022-11-04 MED ORDER — SODIUM BICARBONATE 650 MG PO TABS
1300.0000 mg | ORAL_TABLET | Freq: Two times a day (BID) | ORAL | Status: DC
  Administered 2022-11-04 – 2022-11-08 (×9): 1300 mg via ORAL
  Filled 2022-11-04 (×9): qty 2

## 2022-11-04 NOTE — TOC Benefit Eligibility Note (Signed)
Pharmacy Patient Advocate Encounter  Insurance verification completed.    The patient is insured through [employer]/ [employer]   Ran test claim for Dificid 200 mg and the current 10 day co-pay is $11.20.   This test claim was processed through [facility]- copay amounts may vary at other pharmacies due to pharmacy/plan contracts, or as the patient moves through the different stages of their insurance plan.    Roland Earl, CPHT Pharmacy Patient Advocate Specialist [facility] Patient Advocate Team Direct Number: [phone]  Fax: [phone]

## 2022-11-04 NOTE — Evaluation (Signed)
Physical Therapy Evaluation Patient Details Name: [name] MRN: [id number] DOB: Feb 16, 1919 Today's Date: 11/04/2022  History of Present Illness  Pt is a 89 y.o. female presenting to hospital 11/01/22 with c/o generalized weakness and critically low potassium.  Recent hospitalization June 2024 with C. Difficile infection.  Has an extensive infected decubitus ulcer.  Pt admitted with abnormal blood electrolyte level, anemia, AKI superimposed on CKD, hyponatremia, hypokalemia, C. Difficile colitis, and infected decubitus ulcer.  PMH includes htn, embolism, SBO, L THA, uterine CA.  Clinical Impression  Prior to hospital admission, pt was most recently at rehab; per chart (TOC note) plan was for pt to be discharged home on Tuesday after air mattress was delivered for her hospital bed; per chart (TOC note) son lives next door (stays with pt at night) and pt has caregivers all during the day.  Currently pt is mod to max assist with bed mobility and min assist for sitting balance d/t posterior lean; limited activity d/t pt fatigue.  Pt pleasant and participatory during session.  Pt assisted back to bed and repositioned for comfort end of session with all needs in place.  Pt would currently benefit from skilled PT to address noted impairments and functional limitations (see below for any additional details).  Upon hospital discharge, pt would benefit from ongoing therapy.     Assistance Recommended at Discharge Frequent or constant Supervision/Assistance  If plan is discharge home, recommend the following:  Can travel by private vehicle  Two people to help with walking and/or transfers;A lot of help with bathing/dressing/bathroom;Assistance with cooking/housework;Assistance with feeding;Direct supervision/assist for medications management;Direct supervision/assist for financial management;Assist for transportation;Help with stairs or ramp for entrance   No    Equipment Recommendations Wheelchair  (measurements PT);Wheelchair cushion (measurements PT);Hospital bed;Other (comment);BSC/3in1 (hoyer lift)  Recommendations for Other Services       Functional Status Assessment Patient has had a recent decline in their functional status and demonstrates the ability to make significant improvements in function in a reasonable and predictable amount of time.     Precautions / Restrictions Precautions Precautions: Fall Restrictions Weight Bearing Restrictions: No      Mobility  Bed Mobility Overal bed mobility: Needs Assistance Bed Mobility: Rolling, Sidelying to Sit, Sit to Sidelying Rolling: Mod assist Sidelying to sit: Mod assist, Max assist     Sit to sidelying: Mod assist, Max assist General bed mobility comments: assist for trunk and B LE's; vc's for technique    Transfers                   General transfer comment: Deferred d/t pt fatigue sitting edge of bed    Ambulation/Gait                  Stairs            Wheelchair Mobility     Tilt Bed    Modified Rankin (Stroke Patients Only)       Balance Overall balance assessment: Needs assistance Sitting-balance support: Bilateral upper extremity supported, Feet supported Sitting balance-Leahy Scale: Poor Sitting balance - Comments: min assist for sitting balance d/t posterior lean Postural control: Posterior lean                                   Pertinent Vitals/Pain Pain Assessment Pain Assessment: Faces Faces Pain Scale: Hurts little more Pain Location: sacral wound Pain Descriptors / Indicators: Discomfort, Grimacing,  Guarding, Sharp Pain Intervention(s): Limited activity within patient's tolerance, Monitored during session, Repositioned Vitals (HR and SpO2 on room air) stable and WFL throughout treatment session.    Home Living Family/patient expects to be discharged to:: Private residence Living Arrangements: Alone Available Help at Discharge: Family;Personal  care attendant;Available PRN/intermittently Type of Home: House Home Access: Ramped entrance     Alternate Level Stairs-Number of Steps: 10-12 (patient states she does not go upstairs) Home Layout: Two level;Able to live on main level with bedroom/bathroom;Full bath on main level Home Equipment: Rolling Walker (2 wheels);Wheelchair - manual;BSC/3in1 Additional Comments: Pt had difficulty verbalizing home set-up information so home set-up information taken from recent therapy encounter.    Prior Function Prior Level of Function : Needs assist       Physical Assist : Mobility (physical) Mobility (physical): Bed mobility;Transfers;Gait;Stairs   Mobility Comments: Assisted transfers from lift chair using RW; mostly wheelchair bound ADLs Comments: Home aides 5 hours/day x 7 days/week (per recent therapy encounter)     Hand Dominance        Extremity/Trunk Assessment   Upper Extremity Assessment Upper Extremity Assessment: Generalized weakness    Lower Extremity Assessment Lower Extremity Assessment: Generalized weakness (Pt appearing with B foot drop (also recently referenced in PT note from prior hospitalization))    Cervical / Trunk Assessment Cervical / Trunk Assessment: Kyphotic  Communication   Communication: HOH  Cognition Arousal/Alertness: Awake/alert Behavior During Therapy: WFL for tasks assessed/performed Overall Cognitive Status: Within Functional Limits for tasks assessed                                          General Comments  Nursing cleared pt for participation in physical therapy.  Pt agreeable to PT session.    Exercises General Exercises - Lower Extremity Long Arc Quad: AROM, Strengthening, Both, 5 reps, Seated Hip Flexion/Marching: AROM, Strengthening, Both, 5 reps, Seated   Assessment/Plan    PT Assessment Patient needs continued PT services  PT Problem List Decreased strength;Decreased activity tolerance;Decreased  balance;Decreased mobility;Pain       PT Treatment Interventions DME instruction;Functional mobility training;Therapeutic activities;Therapeutic exercise;Patient/family education;Balance training    PT Goals (Current goals can be found in the Care Plan section)  Acute Rehab PT Goals Patient Stated Goal: return home with home aides and family to assist PT Goal Formulation: With patient Time For Goal Achievement: 11/18/22 Potential to Achieve Goals: Good    Frequency Min 1X/week     Co-evaluation               AM-PAC PT "6 Clicks" Mobility  Outcome Measure Help needed turning from your back to your side while in a flat bed without using bedrails?: A Lot Help needed moving from lying on your back to sitting on the side of a flat bed without using bedrails?: A Lot Help needed moving to and from a bed to a chair (including a wheelchair)?: Total Help needed standing up from a chair using your arms (e.g., wheelchair or bedside chair)?: Total Help needed to walk in hospital room?: Total Help needed climbing 3-5 steps with a railing? : Total 6 Click Score: 8    End of Session   Activity Tolerance: Patient limited by fatigue Patient left: in bed;with call bell/phone within reach;with bed alarm set;Other (comment) (B heels floating via pillow support; pillow placed per pt request under L hip to  offload pressure) Nurse Communication: Mobility status;Precautions PT Visit Diagnosis: Unsteadiness on feet (R26.81);Other abnormalities of gait and mobility (R26.89);Muscle weakness (generalized) (M62.81)    Time: 2956-2130 PT Time Calculation (min) (ACUTE ONLY): 37 min   Charges:   PT Evaluation $PT Eval Low Complexity: 1 Low PT Treatments $Therapeutic Activity: 8-22 mins PT General Charges $$ ACUTE PT VISIT: 1 Visit        Hendricks Limes, PT 11/04/22, 5:24 PM

## 2022-11-04 NOTE — TOC Progression Note (Signed)
Transition of Care [facility]) - Progression Note    Patient Details  Name: [name] MRN: [id number] Date of Birth: May 16, 1918  [facility]) CM/SW Contact  Garret Reddish, RN Phone Number: 11/04/2022, 9:34 PM  Clinical Narrative:   Received call from patient's son.  He informs me that patient has a hospital bed that was she was using but was not her own.  Mr. Ciccolella reports that he attempted to receive a air mattress for that bed and was told the the air mattress had to be provided but the same company that the bed was purchased from.  He has requested that a hospital bed and air mattress be ordered for [name].    I have asked John with [employer] to provide patient with a hospital bed and air mattress.  Jonny Ruiz has made me aware that orders have been processed and after hours tech will deliver the bed for patient tonight or either in the morning.    TOC will continue to follow for discharge planning.      Expected Discharge Plan: Home w Home Health Services Barriers to Discharge: Barriers Resolved  Expected Discharge Plan and Services     Post Acute Care Choice: Home Health Living arrangements for the past 2 months: Single Family Home                           HH Arranged: PT, OT, RN [facility]: [facility] Date HH Agency Contacted: 11/02/22 Time HH Agency Contacted: 1357 Representative spoke with at [facility] Agency: Barbara Cower   Social Determinants of Health (SDOH) Interventions SDOH Screenings   Food Insecurity: No Food Insecurity (11/02/2022)  Housing: Low Risk  (11/02/2022)  Transportation Needs: No Transportation Needs (11/02/2022)  Utilities: Not At Risk (11/02/2022)  Alcohol Screen: Low Risk  (09/17/2022)  Depression (PHQ2-9): Low Risk  (09/17/2022)  Financial Resource Strain: Low Risk  (09/17/2022)  Physical Activity: Insufficiently Active (09/17/2022)  Social Connections: Moderately Integrated (09/17/2022)  Stress: No Stress Concern Present  (09/17/2022)  Tobacco Use: Low Risk  (11/01/2022)    Readmission Risk Interventions     No data to display

## 2022-11-04 NOTE — Progress Notes (Signed)
Progress Note   Patient: [name]:[id number] DOB: 1919/04/09 DOA: 11/01/2022     2 DOS: the patient was seen and examined on 11/04/2022   Brief hospital course: 89 y.o. female with medical history significant for hypertension, embolism, SBO coming to [place] for abnormal electrolytes.  Patient was admitted on June 2024 for generalized weakness found to have a C. difficile infection.  Patient was discharged with p.o. vancomycin.  Patient is essentially bedridden and has an extensive infected decubitus ulcer has been unstageable.  Patient has been taking her vancomycin for C. difficile infection.  Per chart review patient has an infected sacral decub that was negative for osteomyelitis patient was discharged with 4 days of Doxy and cefdinir which she has completed.   The emergency room today patient is afebrile O2 sats of 100% on room air. Blood pressure stable at 144/53.  EKG shows sinus rhythm at 94 with a first-degree AV heart block and a PR interval of 254. Metabolic panel shows hypokalemia metabolic acidosis AKI hypomagnesemia CBC shows leukocytosis of 18.3 hemoglobin of 8.4 platelet counts of 626. Patient received supplementation for potassium and magnesium replacement in the emergency room.  7/6.  Creatinine still elevated at 2.07.  Will continue IV fluids overnight.  Add sodium bicarb tablets.  Change p.o. vancomycin over to Dificid. 7/7.  Hemoglobin drifted down to 7.6 and creatinine lowered to 1.93.  Discontinued fluids.  Continue sodium bicarb tablets. 7/8.  Hemoglobin 7.7, creatinine 2.13.  Will continue sodium bicarb tablets at increased dose.  Will give fluid bolus.  Assessment and Plan: * Acute kidney injury superimposed on CKD (HCC) Acute kidney injury on CKD stage IV.  Baseline creatinine around 1.53.  Creatinine on admission 2.24 and today's creatinine 2.13.  Continue sodium bicarb tablets.  Will give fluid bolus today.   Abnormal blood electrolyte level Patient found to  have hypokalemia hypomagnesemia hyponatremia secondary to decreased p.o. intake and GI losses.  Potassium up to 3.8.  Sodium 138.  Phosphorus 4.0, magnesium 2.1.  Continue potassium replacement with diarrhea.   C. difficile colitis Changed p.o. vancomycin to Dificid on 7/6.     Iron deficiency anemia Hemoglobin drifted down to 7.7 with IV fluid hydration.  Continue oral iron.  Decubitus ulcer of buttock, stage 3 (HCC) Present on admission.  Does not look infected.  Buttock/sacral decubiti.  Appreciate wound care evaluation.  Hypokalemia    Essential (primary) hypertension Continue patient on diltiazem and hydralazine.         Subjective: Patient was sleeping this morning and did not eat breakfast.  She states that she needs help to eat.  Spoke with nursing staff and patient told them that she wanted to sleep rather than eat this morning.  Admitted with C. difficile colitis and electrolyte abnormalities.  Physical Exam: Vitals:   11/03/22 2013 11/04/22 0045 11/04/22 0414 11/04/22 0737  BP: (!) 143/49 (!) 147/52 (!) 131/46 (!) 143/49  Pulse: 97 90 89 81  Resp: 20 16 16 20   Temp: 98.7 F (37.1 C) 98.3 F (36.8 C) 98.1 F (36.7 C) 98.4 F (36.9 C)  TempSrc: Oral     SpO2: 99% 98% 98% 99%  Weight:      Height:       Physical Exam HENT:     Head: Normocephalic.     Mouth/Throat:     Pharynx: No oropharyngeal exudate.  Eyes:     General: Lids are normal.     Conjunctiva/sclera: Conjunctivae normal.  Cardiovascular:  Rate and Rhythm: Normal rate and regular rhythm.     Heart sounds: Normal heart sounds, S1 normal and S2 normal.  Pulmonary:     Breath sounds: No decreased breath sounds, wheezing, rhonchi or rales.  Abdominal:     Palpations: Abdomen is soft.     Tenderness: There is no abdominal tenderness.  Musculoskeletal:     Right lower leg: No swelling.     Left lower leg: No swelling.  Skin:    General: Skin is warm.     Comments: Stage III decubitus  on left buttock/sacral area.  Chronic lower extremity skin discoloration.  Neurological:     Mental Status: She is alert and oriented to person, place, and time.     Data Reviewed: White blood cell count 17.1, hemoglobin 7.7, platelet count 575, creatinine 2.13 with a GFR of 20, CO2 17, sodium 138.  Family Communication: Left message for son  Disposition: Status is: Inpatient Remains inpatient appropriate because: Will have to confirm once hospital bed is delivered to patient's home.  Planned Discharge Destination: Home with Home Health    Time spent: 27 minutes  Author: Alford Highland, MD 11/04/2022 1:06 PM  For on call review [email or web address].

## 2022-11-05 DIAGNOSIS — N179 Acute kidney failure, unspecified: Secondary | ICD-10-CM | POA: Diagnosis not present

## 2022-11-05 DIAGNOSIS — E876 Hypokalemia: Secondary | ICD-10-CM | POA: Diagnosis not present

## 2022-11-05 DIAGNOSIS — E878 Other disorders of electrolyte and fluid balance, not elsewhere classified: Secondary | ICD-10-CM | POA: Diagnosis not present

## 2022-11-05 DIAGNOSIS — R532 Functional quadriplegia: Secondary | ICD-10-CM | POA: Insufficient documentation

## 2022-11-05 LAB — BASIC METABOLIC PANEL
Anion gap: 7 (ref 5–15)
BUN: 31 mg/dL — ABNORMAL HIGH (ref 8–23)
CO2: 17 mmol/L — ABNORMAL LOW (ref 22–32)
Calcium: 8.9 mg/dL (ref 8.9–10.3)
Chloride: 113 mmol/L — ABNORMAL HIGH (ref 98–111)
Creatinine, Ser: 1.87 mg/dL — ABNORMAL HIGH (ref 0.44–1.00)
GFR, Estimated: 23 mL/min — ABNORMAL LOW (ref >60)
Glucose, Bld: 94 mg/dL (ref 70–99)
Potassium: 3.5 mmol/L (ref 3.5–5.1)
Sodium: 137 mmol/L (ref 135–145)

## 2022-11-05 LAB — HEMOGLOBIN: Hemoglobin: 7.6 g/dL — ABNORMAL LOW (ref 12.0–15.0)

## 2022-11-05 NOTE — Assessment & Plan Note (Signed)
Patient is total care and needs to be fed.

## 2022-11-05 NOTE — Care Management Important Message (Signed)
Important Message  Patient Details  Name: [name] MRN: [id number] Date of Birth: Feb 25, 1919   Medicare Important Message Given:  N/A - LOS <3 / Initial given by admissions     Olegario Messier A [name] 11/05/2022, 8:23 AM

## 2022-11-05 NOTE — Progress Notes (Signed)
Progress Note   Patient: [name] ZOX:[id number] DOB: 10/19/1918 DOA: 11/01/2022     3 DOS: the patient was seen and examined on 11/05/2022   Brief hospital course: 89 y.o. female with medical history significant for hypertension, embolism, SBO coming to [place] for abnormal electrolytes.  Patient was admitted on June 2024 for generalized weakness found to have a C. difficile infection.  Patient was discharged with p.o. vancomycin.  Patient is essentially bedridden and has an extensive infected decubitus ulcer has been unstageable.  Patient has been taking her vancomycin for C. difficile infection.  Per chart review patient has an infected sacral decub that was negative for osteomyelitis patient was discharged with 4 days of Doxy and cefdinir which she has completed.   The emergency room today patient is afebrile O2 sats of 100% on room air. Blood pressure stable at 144/53.  EKG shows sinus rhythm at 94 with a first-degree AV heart block and a PR interval of 254. Metabolic panel shows hypokalemia metabolic acidosis AKI hypomagnesemia CBC shows leukocytosis of 18.3 hemoglobin of 8.4 platelet counts of 626. Patient received supplementation for potassium and magnesium replacement in the emergency room.  7/6.  Creatinine still elevated at 2.07.  Will continue IV fluids overnight.  Add sodium bicarb tablets.  Change p.o. vancomycin over to Dificid. 7/7.  Hemoglobin drifted down to 7.6 and creatinine lowered to 1.93.  Discontinued fluids.  Continue sodium bicarb tablets. 7/8.  Hemoglobin 7.7, creatinine 2.13.  Will continue sodium bicarb tablets at increased dose.  Will give fluid bolus. 7/9.  Creatinine 1.87.  Hemoglobin 7.6.  Still waiting on bed to be delivered.  Assessment and Plan: * Acute kidney injury superimposed on CKD (HCC) Acute kidney injury on CKD stage IV.  Baseline creatinine around 1.53.  Creatinine on admission 2.24 and today's creatinine 1.87.  Continue sodium bicarb tablets.     Abnormal blood electrolyte level Patient found to have hypokalemia hypomagnesemia hyponatremia secondary to decreased p.o. intake and GI losses.  Potassium up to 3.5.  Sodium 137.  Phosphorus 4.0, magnesium 2.1.  Continue potassium replacement with diarrhea.   C. difficile colitis Changed p.o. vancomycin to Dificid on 7/6.  I only see 1 bowel movement documented yesterday and 1 for today so far.   Functional quadriplegia [facility]) Patient is total care and needs to be fed.  Iron deficiency anemia Hemoglobin drifted down to 7.6 with IV fluid hydration.  Continue oral iron.  Decubitus ulcer of buttock, stage 3 (HCC) Present on admission.  Does not look infected.  Buttock/sacral decubiti.  Appreciate wound care evaluation.  Hypokalemia    Essential (primary) hypertension Continue patient on diltiazem and hydralazine.         Subjective: Patient feels okay.  Spoke with patient's son on the phone and he would still like the patient home.  Still waiting on a bed to be delivered.  Admitted with electrolyte abnormalities and C. difficile colitis.  Physical Exam: Vitals:   11/04/22 0737 11/04/22 1612 11/04/22 2029 11/05/22 0748  BP: (!) 143/49 (!) 143/44 (!) 134/47 (!) 132/48  Pulse: 81 80 87 86  Resp: 20 20  16   Temp: 98.4 F (36.9 C) 97.7 F (36.5 C) 97.8 F (36.6 C) 97.7 F (36.5 C)  TempSrc:    Oral  SpO2: 99% 99% 99% 100%  Weight:      Height:       Physical Exam HENT:     Head: Normocephalic.     Mouth/Throat:  Pharynx: No oropharyngeal exudate.  Eyes:     General: Lids are normal.     Conjunctiva/sclera: Conjunctivae normal.  Cardiovascular:     Rate and Rhythm: Normal rate and regular rhythm.     Heart sounds: Normal heart sounds, S1 normal and S2 normal.  Pulmonary:     Breath sounds: No decreased breath sounds, wheezing, rhonchi or rales.  Abdominal:     Palpations: Abdomen is soft.     Tenderness: There is no abdominal tenderness.   Musculoskeletal:     Right lower leg: No swelling.     Left lower leg: No swelling.  Skin:    General: Skin is warm.     Comments: Stage III decubitus on left buttock/sacral area.  Chronic lower extremity skin discoloration.  Neurological:     Mental Status: She is alert.     Data Reviewed: Hemoglobin 7.6, creatinine 1.87  Family Communication: Spoke with patient's son on the phone and he would like the patient to come home rather than long-term care.  Disposition: Status is: Inpatient Remains inpatient appropriate because: A bed was supposed to be ordered by the rehab facility but we had to order yesterday.  Still waiting for bed to be delivered.  Planned Discharge Destination: Home with Home Health    Time spent: 28 minutes  Author: Alford Highland, MD 11/05/2022 2:31 PM  For on call review [email or web address].

## 2022-11-05 NOTE — TOC Progression Note (Signed)
Transition of Care [facility]) - Progression Note    Patient Details  Name: [name] MRN: [id number] Date of Birth: 08/19/1918  [facility]) CM/SW Contact  Garret Reddish, RN Phone Number: 11/05/2022, 4:57 PM  Clinical Narrative:   I have spoken with [employer] in regards to the hospital bed and air mattress delivery.   [employer] team informs me that due to patient's home location the DME will have to be delivered out of another [employer] office location.  The bed has been assigned to a hospital tech for delivery but has not been delivered at this time.  TOC will continue to follow up with [employer] for status of  DME delivery.  I have made Dr. Renae Gloss aware.      Expected Discharge Plan: Home w Home Health Services Barriers to Discharge: Barriers Resolved  Expected Discharge Plan and Services     Post Acute Care Choice: Home Health Living arrangements for the past 2 months: Single Family Home                           HH Arranged: PT, OT, RN [facility] Agency: [facility] Date HH Agency Contacted: 11/02/22 Time HH Agency Contacted: [phone] Representative spoke with at [facility] Agency: Barbara Cower   Social Determinants of Health (SDOH) Interventions SDOH Screenings   Food Insecurity: No Food Insecurity (11/02/2022)  Housing: Low Risk  (11/02/2022)  Transportation Needs: No Transportation Needs (11/02/2022)  Utilities: Not At Risk (11/02/2022)  Alcohol Screen: Low Risk  (09/17/2022)  Depression (PHQ2-9): Low Risk  (09/17/2022)  Financial Resource Strain: Low Risk  (09/17/2022)  Physical Activity: Insufficiently Active (09/17/2022)  Social Connections: Moderately Integrated (09/17/2022)  Stress: No Stress Concern Present (09/17/2022)  Tobacco Use: Low Risk  (11/01/2022)    Readmission Risk Interventions     No data to display

## 2022-11-06 DIAGNOSIS — E876 Hypokalemia: Secondary | ICD-10-CM | POA: Diagnosis not present

## 2022-11-06 DIAGNOSIS — N179 Acute kidney failure, unspecified: Secondary | ICD-10-CM | POA: Diagnosis not present

## 2022-11-06 DIAGNOSIS — A0472 Enterocolitis due to Clostridium difficile, not specified as recurrent: Secondary | ICD-10-CM | POA: Diagnosis not present

## 2022-11-06 DIAGNOSIS — R532 Functional quadriplegia: Secondary | ICD-10-CM

## 2022-11-06 DIAGNOSIS — E878 Other disorders of electrolyte and fluid balance, not elsewhere classified: Secondary | ICD-10-CM | POA: Diagnosis not present

## 2022-11-06 DIAGNOSIS — L89303 Pressure ulcer of unspecified buttock, stage 3: Secondary | ICD-10-CM

## 2022-11-06 LAB — CBC
HCT: 22.9 % — ABNORMAL LOW (ref 36.0–46.0)
Hemoglobin: 7.7 g/dL — ABNORMAL LOW (ref 12.0–15.0)
MCH: 26.2 pg (ref 26.0–34.0)
MCHC: 33.6 g/dL (ref 30.0–36.0)
MCV: 77.9 fL — ABNORMAL LOW (ref 80.0–100.0)
Platelets: 571 10*3/uL — ABNORMAL HIGH (ref 150–400)
RBC: 2.94 MIL/uL — ABNORMAL LOW (ref 3.87–5.11)
RDW: 18.2 % — ABNORMAL HIGH (ref 11.5–15.5)
WBC: 14 10*3/uL — ABNORMAL HIGH (ref 4.0–10.5)
nRBC: 0 % (ref 0.0–0.2)

## 2022-11-06 LAB — BASIC METABOLIC PANEL
Anion gap: 8 (ref 5–15)
BUN: 32 mg/dL — ABNORMAL HIGH (ref 8–23)
CO2: 16 mmol/L — ABNORMAL LOW (ref 22–32)
Calcium: 9 mg/dL (ref 8.9–10.3)
Chloride: 114 mmol/L — ABNORMAL HIGH (ref 98–111)
Creatinine, Ser: 1.84 mg/dL — ABNORMAL HIGH (ref 0.44–1.00)
GFR, Estimated: 24 mL/min — ABNORMAL LOW (ref >60)
Glucose, Bld: 88 mg/dL (ref 70–99)
Potassium: 4.1 mmol/L (ref 3.5–5.1)
Sodium: 138 mmol/L (ref 135–145)

## 2022-11-06 NOTE — TOC Progression Note (Signed)
Transition of Care [facility]) - Progression Note    Patient Details  Name: [name] MRN: [id number] Date of Birth: 02-13-1919  Transition of Care [facility]) CM/SW Contact  Garret Reddish, RN Phone Number: 11/06/2022, 12:35 PM  Clinical Narrative:   I have received message from [facility] team member Olegario Messier She reports that she spoke with son, [name] and he said he talked with [employer] and bed/mattress will only be approved if MD puts in a urgent request. Phone # [phone], option 3.  I have informed Dr. Clide Dales of the above information and I have asked Dr. Clide Dales to call the above number to request an urgent hospital bed request.    I have also spoken with Jonny Ruiz with [employer] and he reports that the bed delivery will be arranged via the [employer] [place] office.  He reports that the bed set up has been assigned to a [employer] today.  TOC will continue to follow for status of bed delivery and discharge planning.     Expected Discharge Plan: Home w Home Health Services Barriers to Discharge: Barriers Resolved  Expected Discharge Plan and Services     Post Acute Care Choice: Home Health Living arrangements for the past 2 months: Single Family Home                           HH Arranged: PT, OT, RN [facility] Agency: [facility] Date HH Agency Contacted: 11/02/22 Time HH Agency Contacted: [phone] Representative spoke with at [facility] Agency: Barbara Cower   Social Determinants of Health (SDOH) Interventions SDOH Screenings   Food Insecurity: No Food Insecurity (11/02/2022)  Housing: Low Risk  (11/02/2022)  Transportation Needs: No Transportation Needs (11/02/2022)  Utilities: Not At Risk (11/02/2022)  Alcohol Screen: Low Risk  (09/17/2022)  Depression (PHQ2-9): Low Risk  (09/17/2022)  Financial Resource Strain: Low Risk  (09/17/2022)  Physical Activity: Insufficiently Active (09/17/2022)  Social Connections: Moderately Integrated (09/17/2022)  Stress: No Stress Concern Present (09/17/2022)   Tobacco Use: Low Risk  (11/01/2022)    Readmission Risk Interventions     No data to display

## 2022-11-06 NOTE — Progress Notes (Signed)
Progress Note   Patient: [name] ZOX:[id number] DOB: Sep 03, 1918 DOA: 11/01/2022     4 DOS: the patient was seen and examined on 11/06/2022   Brief hospital course: 89 y.o. female with medical history significant for hypertension, embolism, SBO coming to [place] for abnormal electrolytes.  Patient was admitted on June 2024 for generalized weakness found to have a C. difficile infection.  Patient was discharged with p.o. vancomycin.  Patient is essentially bedridden and has an extensive infected decubitus ulcer has been unstageable.  Patient has been taking her vancomycin for C. difficile infection.  Per chart review patient has an infected sacral decub that was negative for osteomyelitis patient was discharged with 4 days of Doxy and cefdinir which she has completed.   The emergency room today patient is afebrile O2 sats of 100% on room air. Blood pressure stable at 144/53.  EKG shows sinus rhythm at 94 with a first-degree AV heart block and a PR interval of 254. Metabolic panel shows hypokalemia metabolic acidosis AKI hypomagnesemia CBC shows leukocytosis of 18.3 hemoglobin of 8.4 platelet counts of 626. Patient received supplementation for potassium and magnesium replacement in the emergency room.  7/6.  Creatinine still elevated at 2.07.  Will continue IV fluids overnight.  Add sodium bicarb tablets.  Change p.o. vancomycin over to Dificid. 7/7.  Hemoglobin drifted down to 7.6 and creatinine lowered to 1.93.  Discontinued fluids.  Continue sodium bicarb tablets. 7/8.  Hemoglobin 7.7, creatinine 2.13.  Will continue sodium bicarb tablets at increased dose.  Will give fluid bolus. 7/9.  Creatinine 1.87.  Hemoglobin 7.6.  Still waiting on bed to be delivered. 7/10.  She is lying in the bed, denies any complaints.  Son did not respond to phone call by the company delivering bed.  Assessment and Plan: * Acute kidney injury superimposed on CKD (HCC) Acute kidney injury on CKD stage IV.  Baseline  creatinine around 1.53.  Creatinine on admission 2.24 and yesterday creatinine 1.87.  Continue sodium bicarb tablets.  Repeat BMP for tomorrow morning.  Abnormal blood electrolyte level Patient found to have hypokalemia hypomagnesemia hyponatremia secondary to decreased p.o. intake and GI losses.  Continue to replace electrolytes and recheck continue potassium replacement with diarrhea.  C. difficile colitis Changed p.o. vancomycin to Dificid on 7/6.  No more active diarrhea noted.  Will finish Dificid course.  Functional quadriplegia [facility]) Patient is total care and needs to be fed. She will need a hospital bed at home, son did not pick up phone call by the company.  Iron deficiency anemia Continue oral iron supplements  Decubitus ulcer of buttock, stage 3 (HCC) Present on admission.  Does not look infected.  Buttock/sacral decubiti.  Continue current care  Essential (primary) hypertension Continue patient on diltiazem and hydralazine.   Plan to discharge tomorrow once hospital bed is delivered.      Subjective: Patient is seen and examined today morning.  She is able to answer me, has hard of hearing.  Denies any complaints.  Eating fair.  She is on contact precautions.  Physical Exam: Vitals:   11/05/22 2030 11/06/22 0355 11/06/22 0731 11/06/22 1637  BP: (!) 140/46 (!) 147/58 (!) 124/99 (!) 149/53  Pulse: 90 92 94 97  Resp: 18 18 18 17   Temp: 98.4 F (36.9 C) 98.3 F (36.8 C) 98.8 F (37.1 C) 98.7 F (37.1 C)  TempSrc: Oral Oral  Oral  SpO2: 99% 99% 100% 98%  Weight:      Height:  General - Elderly Caucasian female, no apparent distress HEENT - PERRLA, EOMI, atraumatic head, hard of hearing Lung - Clear, rales, rhonchi, wheezes. Heart - S1, S2 heard, no murmurs, rubs, trace pedal edema Neuro - Alert, awake and oriented, non focal exam. Skin - Warm and dry. Data Reviewed:  CBC, BMP  Family Communication: Son unable to be reached by [facility] for hospital bed  delivery.  Disposition: Status is: Inpatient Remains inpatient appropriate because: need hospital bed delivered.  Planned Discharge Destination: Home with Home Health    Time spent: 44 minutes  Author: Marcelino Duster, MD 11/06/2022 4:49 PM  For on call review [email or web address].

## 2022-11-06 NOTE — TOC Progression Note (Signed)
Transition of Care [facility]) - Progression Note    Patient Details  Name: [name] MRN: [id number] Date of Birth: 1919-02-03  Transition of Care [facility]) CM/SW Contact  Garret Reddish, RN Phone Number: 11/06/2022, 4:42 PM  Clinical Narrative:   Informed by [employer] that Hospital bed delivery tech has been trying to contact patient's son about delivery of Hospital bed.  Tech also went by son's home and no one came to the door.    I have attempted to call son and let him know that [employer] is trying to deliver the hospital bed.  I have also been unsuccessful in contacting the patient's son.    I will continue in efforts to reach out to patient's son.      Expected Discharge Plan: Home w Home Health Services Barriers to Discharge: Barriers Resolved  Expected Discharge Plan and Services     Post Acute Care Choice: Home Health Living arrangements for the past 2 months: Single Family Home                           HH Arranged: PT, OT, RN [facility] Agency: [facility] Date HH Agency Contacted: 11/02/22 Time HH Agency Contacted: [phone] Representative spoke with at [facility] Agency: Barbara Cower   Social Determinants of Health (SDOH) Interventions SDOH Screenings   Food Insecurity: No Food Insecurity (11/02/2022)  Housing: Low Risk  (11/02/2022)  Transportation Needs: No Transportation Needs (11/02/2022)  Utilities: Not At Risk (11/02/2022)  Alcohol Screen: Low Risk  (09/17/2022)  Depression (PHQ2-9): Low Risk  (09/17/2022)  Financial Resource Strain: Low Risk  (09/17/2022)  Physical Activity: Insufficiently Active (09/17/2022)  Social Connections: Moderately Integrated (09/17/2022)  Stress: No Stress Concern Present (09/17/2022)  Tobacco Use: Low Risk  (11/01/2022)    Readmission Risk Interventions     No data to display

## 2022-11-06 NOTE — Progress Notes (Signed)
Physical Therapy Treatment Patient Details Name: [name] MRN: [id number] DOB: 1919/01/30 Today's Date: 11/06/2022   History of Present Illness Pt is a 89 y.o. female presenting to hospital 11/01/22 with c/o generalized weakness and critically low potassium.  Recent hospitalization June 2024 with C. Difficile infection.  Has an extensive infected decubitus ulcer.  Pt admitted with abnormal blood electrolyte level, anemia, AKI superimposed on CKD, hyponatremia, hypokalemia, C. Difficile colitis, and infected decubitus ulcer.  PMH includes htn, embolism, SBO, L THA, uterine CA.    PT Comments  Patient received in bed. She is VERY HOH and prefers you speak into right ear. She also has low vision. Patient is pleasant and agrees to mobility although reports her bottom and legs are very raw and sore. Patient participated in B LE strengthening/rom exercises in bed, then required max assist to sit up on side of bed. She is uncomfortable sitting reporting back and bottom pain. Fatigues easily and requested to lie back down. Patient required max assist to return to supine and for positioning in bed. She will continue to benefit from skilled PT to improve mobility and reduce caregiver burden.         Assistance Recommended at Discharge Frequent or constant Supervision/Assistance  If plan is discharge home, recommend the following:  Can travel by private vehicle    A lot of help with walking and/or transfers;A lot of help with bathing/dressing/bathroom;Assistance with feeding;Direct supervision/assist for medications management;Assist for transportation;Help with stairs or ramp for entrance;Direct supervision/assist for financial management;Assistance with cooking/housework   No  Equipment Recommendations  Wheelchair (measurements PT);Wheelchair cushion (measurements PT);Hospital bed;Other (comment);BSC/3in1    Recommendations for Other Services       Precautions / Restrictions  Precautions Precautions: Fall Restrictions Weight Bearing Restrictions: No     Mobility  Bed Mobility Overal bed mobility: Needs Assistance Bed Mobility: Supine to Sit, Sit to Supine     Supine to sit: Max assist   Sit to sidelying: Max assist General bed mobility comments: Max assist for trunk and B LE's; vc's for technique    Transfers                   General transfer comment: Deferred d/t pt fatigue sitting edge of bed    Ambulation/Gait                   Stairs             Wheelchair Mobility     Tilt Bed    Modified Rankin (Stroke Patients Only)       Balance Overall balance assessment: Needs assistance Sitting-balance support: Feet supported, Bilateral upper extremity supported Sitting balance-Leahy Scale: Poor Sitting balance - Comments: min assist for sitting balance d/t posterior lean                                    Cognition Arousal/Alertness: Awake/alert Behavior During Therapy: WFL for tasks assessed/performed Overall Cognitive Status: Within Functional Limits for tasks assessed                                          Exercises Other Exercises Other Exercises: AA- AP, heel slides, hip abd/add x 10 reps bilaterally    General Comments        Pertinent Vitals/Pain Pain  Assessment Pain Assessment: Faces Faces Pain Scale: Hurts even more Pain Location: sacral wound/legs/back upon sitting up Pain Descriptors / Indicators: Discomfort, Grimacing, Guarding Pain Intervention(s): Monitored during session, Repositioned    Home Living                          Prior Function            PT Goals (current goals can now be found in the care plan section) Acute Rehab PT Goals Patient Stated Goal: return home with home aides and family to assist PT Goal Formulation: With patient Time For Goal Achievement: 11/18/22 Potential to Achieve Goals: Good Progress towards PT goals:  Progressing toward goals    Frequency    Min 1X/week      PT Plan Current plan remains appropriate    Co-evaluation              AM-PAC PT "6 Clicks" Mobility   Outcome Measure  Help needed turning from your back to your side while in a flat bed without using bedrails?: A Lot Help needed moving from lying on your back to sitting on the side of a flat bed without using bedrails?: Total Help needed moving to and from a bed to a chair (including a wheelchair)?: Total Help needed standing up from a chair using your arms (e.g., wheelchair or bedside chair)?: Total Help needed to walk in hospital room?: Total Help needed climbing 3-5 steps with a railing? : Total 6 Click Score: 7    End of Session   Activity Tolerance: Patient limited by fatigue;Patient limited by pain Patient left: in bed;with call bell/phone within reach;with bed alarm set Nurse Communication: Mobility status PT Visit Diagnosis: Other abnormalities of gait and mobility (R26.89);Muscle weakness (generalized) (M62.81);Pain Pain - part of body:  (bottom and legs)     Time: 1044-1100 PT Time Calculation (min) (ACUTE ONLY): 16 min  Charges:    $Therapeutic Activity: 8-22 mins PT General Charges $$ ACUTE PT VISIT: 1 Visit                     Ellenor Wisniewski, PT, GCS 11/06/22,11:57 AM

## 2022-11-06 NOTE — Care Management Important Message (Signed)
Important Message  Patient Details  Name: [name] MRN: [id number] Date of Birth: 1918-07-27   Medicare Important Message Given:  Yes     Olegario Messier A Norton Bivins 11/06/2022, 10:31 AM

## 2022-11-06 NOTE — Progress Notes (Signed)
Initial Nutrition Assessment  DOCUMENTATION CODES:   Underweight  INTERVENTION:  - Continue Ensure Enlive po BID, each supplement provides 350 kcal and 20 grams of protein.    NUTRITION DIAGNOSIS:   Underweight related to chronic illness as evidenced by other (comment) (BMI <18.5).  GOAL:   Patient will meet greater than or equal to 90% of their needs  MONITOR:   PO intake, Supplement acceptance  REASON FOR ASSESSMENT:   Malnutrition Screening Tool    ASSESSMENT:   89 y.o. female admits related to abnormal labs. PMH includes: DDD, DVT, L hip fracture, HTN, osteoporosis, PE, SBO, sigmoid diverticulosis. Pt is currently receiving medical management related to AKI superimposed on CKD.  Meds reviewed:  calcitriol, pepcid, ferrous sulfate, klor-con, sodium bicarbonate. Labs reviewed: BUN/Creatinine elevated.   The pt was sleeping at time of assessment. Per record, the pt ate 75-100% of her meals today. The pt was planning to discharge today but it got pushed to tomorrow. Wts stable per record. RD will continue to monitor PO intakes.   NUTRITION - FOCUSED PHYSICAL EXAM:  Unable to assess.   Diet Order:   Diet Order             DIET SOFT Room service appropriate? Yes; Fluid consistency: Thin  Diet effective now                   EDUCATION NEEDS:   Not appropriate for education at this time  Skin:  Skin Assessment: Skin Integrity Issues: Skin Integrity Issues:: Unstageable Unstageable: L sacrum  Last BM:  7/10 - type 7  Height:   Ht Readings from Last 1 Encounters:  11/02/22 5\' 2"  (1.575 m)    Weight:   Wt Readings from Last 1 Encounters:  11/03/22 44.3 kg    Ideal Body Weight:     BMI:  Body mass index is 17.86 kg/m.  Estimated Nutritional Needs:   Kcal:  1330-1550 kcals  Protein:  65-80 gm  Fluid:  >/= 1.3 L  Bethann Humble, RD, LDN, CNSC.

## 2022-11-07 DIAGNOSIS — E878 Other disorders of electrolyte and fluid balance, not elsewhere classified: Secondary | ICD-10-CM | POA: Diagnosis not present

## 2022-11-07 DIAGNOSIS — E876 Hypokalemia: Secondary | ICD-10-CM | POA: Diagnosis not present

## 2022-11-07 DIAGNOSIS — A0472 Enterocolitis due to Clostridium difficile, not specified as recurrent: Secondary | ICD-10-CM | POA: Diagnosis not present

## 2022-11-07 DIAGNOSIS — N179 Acute kidney failure, unspecified: Secondary | ICD-10-CM | POA: Diagnosis not present

## 2022-11-07 MED ORDER — GERHARDT'S BUTT CREAM
TOPICAL_CREAM | Freq: Three times a day (TID) | CUTANEOUS | Status: DC | PRN
  Filled 2022-11-07 (×2): qty 1

## 2022-11-07 MED ORDER — FLUCONAZOLE 100 MG PO TABS
100.0000 mg | ORAL_TABLET | Freq: Every day | ORAL | Status: DC
  Administered 2022-11-07: 100 mg via ORAL
  Filled 2022-11-07 (×2): qty 1

## 2022-11-07 NOTE — TOC Progression Note (Signed)
Transition of Care [facility]) - Progression Note    Patient Details  Name: [name] MRN: [id number] Date of Birth: 21-Aug-1918  Transition of Care [facility]) CM/SW Contact  Garret Reddish, RN Phone Number: 11/07/2022, 10:12 AM  Clinical Narrative:   I have reached out to Mr. [name] this am and was unable to reach him today.  I reached out to his brother [name] and made him aware that I was trying to contact [name] for a few days.  [name] reports that [name] has been having some trouble with his phone. He informs me that he will reach out to [name] and make him aware that [employer] has been trying to reach to deliver the hospital bed.  I have also given him my contact number to reach out to me.    TOC will continue to follow for discharge planning.      Expected Discharge Plan: Home w Home Health Services Barriers to Discharge: Barriers Resolved  Expected Discharge Plan and Services     Post Acute Care Choice: Home Health Living arrangements for the past 2 months: Single Family Home                           HH Arranged: PT, OT, RN [facility] Agency: [facility] Date HH Agency Contacted: 11/02/22 Time HH Agency Contacted: [phone] Representative spoke with at [facility] Agency: Barbara Cower   Social Determinants of Health (SDOH) Interventions SDOH Screenings   Food Insecurity: No Food Insecurity (11/02/2022)  Housing: Low Risk  (11/02/2022)  Transportation Needs: No Transportation Needs (11/02/2022)  Utilities: Not At Risk (11/02/2022)  Alcohol Screen: Low Risk  (09/17/2022)  Depression (PHQ2-9): Low Risk  (09/17/2022)  Financial Resource Strain: Low Risk  (09/17/2022)  Physical Activity: Insufficiently Active (09/17/2022)  Social Connections: Moderately Integrated (09/17/2022)  Stress: No Stress Concern Present (09/17/2022)  Tobacco Use: Low Risk  (11/01/2022)    Readmission Risk Interventions     No data to display

## 2022-11-07 NOTE — TOC Progression Note (Signed)
Transition of Care [facility]) - Progression Note    Patient Details  Name: CHENAE BRAGER MRN: [id number] Date of Birth: [birth year]  Transition of Care [facility]) CM/SW Contact  Garret Reddish, RN Phone Number: 11/07/2022, 2:55 PM  Clinical Narrative:   Patient son [name] called and informed me that he received a call from [employer] and they set up a delivery time of 9am on tommorow morning.  Mr. [name] reports that he will let me know when the bed/mattress has arrived to him home.  I have updated Dr. Clide Dales the above information.     Expected Discharge Plan: Home w Home Health Services Barriers to Discharge: Barriers Resolved  Expected Discharge Plan and Services     Post Acute Care Choice: Home Health Living arrangements for the past 2 months: Single Family Home                           HH Arranged: PT, OT, RN [facility] Agency: [facility] Date HH Agency Contacted: 11/02/22 Time HH Agency Contacted: [phone] Representative spoke with at [facility] Agency: Barbara Cower   Social Determinants of Health (SDOH) Interventions SDOH Screenings   Food Insecurity: No Food Insecurity (11/02/2022)  Housing: Low Risk  (11/02/2022)  Transportation Needs: No Transportation Needs (11/02/2022)  Utilities: Not At Risk (11/02/2022)  Alcohol Screen: Low Risk  (09/17/2022)  Depression (PHQ2-9): Low Risk  (09/17/2022)  Financial Resource Strain: Low Risk  (09/17/2022)  Physical Activity: Insufficiently Active (09/17/2022)  Social Connections: Moderately Integrated (09/17/2022)  Stress: No Stress Concern Present (09/17/2022)  Tobacco Use: Low Risk  (11/01/2022)    Readmission Risk Interventions     No data to display

## 2022-11-07 NOTE — Progress Notes (Deleted)
Pt intake 75% of lunch. Tolerated w/o N/V. C/o of pain of 8. One time order placed via MD for oxycodone. Administered see mar.

## 2022-11-07 NOTE — Progress Notes (Signed)
Progress Note   Patient: [name] MVH:[id number] DOB: 03/17/1919 DOA: 11/01/2022     5 DOS: the patient was seen and examined on 11/07/2022   Brief hospital course: 89 y.o. female with medical history significant for hypertension, embolism, SBO coming to [place] for abnormal electrolytes.  Patient was admitted on June 2024 for generalized weakness found to have a C. difficile infection.  Patient was discharged with p.o. vancomycin.  Patient is essentially bedridden and has an extensive infected decubitus ulcer has been unstageable.  Patient has been taking her vancomycin for C. difficile infection.  Per chart review patient has an infected sacral decub that was negative for osteomyelitis patient was discharged with 4 days of Doxy and cefdinir which she has completed.   The emergency room today patient is afebrile O2 sats of 100% on room air. Blood pressure stable at 144/53.  EKG shows sinus rhythm at 94 with a first-degree AV heart block and a PR interval of 254. Metabolic panel shows hypokalemia metabolic acidosis AKI hypomagnesemia CBC shows leukocytosis of 18.3 hemoglobin of 8.4 platelet counts of 626. Patient received supplementation for potassium and magnesium replacement in the emergency room.  7/6.  Creatinine still elevated at 2.07.  Will continue IV fluids overnight.  Add sodium bicarb tablets.  Change p.o. vancomycin over to Dificid. 7/7.  Hemoglobin drifted down to 7.6 and creatinine lowered to 1.93.  Discontinued fluids.  Continue sodium bicarb tablets. 7/8.  Hemoglobin 7.7, creatinine 2.13.  Will continue sodium bicarb tablets at increased dose.  Will give fluid bolus. 7/9.  Creatinine 1.87.  Hemoglobin 7.6.  Still waiting on bed to be delivered. 7/10.  She is lying in the bed, denies any complaints.  Son did not respond to phone call by the company delivering bed. 7/11 Patient is sleeping comfortably. Hospital bed delivery scheduled for tomorrow per TOC.   Assessment and Plan: *  Acute kidney injury superimposed on CKD (HCC) Acute kidney injury on CKD stage IV.  Baseline creatinine around 1.53.  Creatinine on admission 2.24 improved to 1.84.  Continue sodium bicarb tablets.    Abnormal blood electrolyte level Patient found to have hypokalemia hypomagnesemia hyponatremia secondary to decreased p.o. intake and GI losses.  Continue to replace electrolytes and recheck continue potassium replacement with diarrhea.  C. difficile colitis Changed p.o. vancomycin to Dificid on 7/6.  No more active diarrhea noted.  Will finish Dificid course.  Functional quadriplegia [facility]) Patient is total care and needs to be fed. She will need a hospital bed at home, son did not pick up phone call by the company.  Iron deficiency anemia Continue oral iron supplements  Decubitus ulcer of buttock, stage 3 (HCC) Present on admission.  Does not look infected.  Buttock/sacral decubiti.  Continue current care  Essential (primary) hypertension Continue patient on diltiazem and hydralazine.   Plan to discharge tomorrow once hospital bed is delivered at around 9AM.      Subjective: Patient is seen and examined today morning.  She is able to answer me, has hard of hearing.  Denies any complaints.  Eating fair.  She is on contact precautions.  Physical Exam: Vitals:   11/06/22 2158 11/07/22 0403 11/07/22 0609 11/07/22 0838  BP: (!) 154/54  (!) 143/52 (!) 140/50  Pulse: 97  90 88  Resp: 18  18 19   Temp: 98.5 F (36.9 C)  99.1 F (37.3 C) 98.1 F (36.7 C)  TempSrc: Oral  Oral Oral  SpO2: 97%  99% 98%  Weight:  45.9 kg    Height:       General - Elderly Caucasian female, no apparent distress HEENT - PERRLA, EOMI, atraumatic head, hard of hearing Lung - Clear, rales, rhonchi, wheezes. Heart - S1, S2 heard, no murmurs, rubs, trace pedal edema Neuro - Alert, awake and oriented, non focal exam. Skin - Warm and dry. Data Reviewed:  CBC, BMP  Family Communication: Son unable to be  reached by [facility] for hospital bed delivery.  Disposition: Status is: Inpatient Remains inpatient appropriate because: need hospital bed delivered.  Planned Discharge Destination: Home with Home Health    Time spent: 42 minutes  Author: Marcelino Duster, MD 11/07/2022 5:00 PM  For on call review [email or web address].

## 2022-11-07 NOTE — TOC Progression Note (Signed)
Transition of Care [facility]) - Progression Note    Patient Details  Name: [name] MRN: [id number] Date of Birth: Jan 08, 1919  Transition of Care [facility]) CM/SW Contact  Garret Reddish, RN Phone Number: 11/07/2022, 12:40 PM  Clinical Narrative:   Received call from Mr. [name].  I have informed him that [employer] and myself have been trying to reach out to him for the delivery of the hospital bed/mattress. He informs me that he has been having problems with his phone.  He has given me another number to give to the [employer] team.  He reports to please call [phone].  I have given this number to the [employer] team.    TOC will continue to follow for discharge planning.      Expected Discharge Plan: Home w Home Health Services Barriers to Discharge: Barriers Resolved  Expected Discharge Plan and Services     Post Acute Care Choice: Home Health Living arrangements for the past 2 months: Single Family Home                           HH Arranged: PT, OT, RN [facility] Agency: [facility] Date HH Agency Contacted: 11/02/22 Time HH Agency Contacted: 1357 Representative spoke with at [facility] Agency: Barbara Cower   Social Determinants of Health (SDOH) Interventions SDOH Screenings   Food Insecurity: No Food Insecurity (11/02/2022)  Housing: Low Risk  (11/02/2022)  Transportation Needs: No Transportation Needs (11/02/2022)  Utilities: Not At Risk (11/02/2022)  Alcohol Screen: Low Risk  (09/17/2022)  Depression (PHQ2-9): Low Risk  (09/17/2022)  Financial Resource Strain: Low Risk  (09/17/2022)  Physical Activity: Insufficiently Active (09/17/2022)  Social Connections: Moderately Integrated (09/17/2022)  Stress: No Stress Concern Present (09/17/2022)  Tobacco Use: Low Risk  (11/01/2022)    Readmission Risk Interventions     No data to display

## 2022-11-07 NOTE — Progress Notes (Signed)
PHARMACIST - PHYSICIAN COMMUNICATION  CONCERNING: Antibiotic IV to Oral Route Change Policy  RECOMMENDATION: This patient is receiving fluconazole by the intravenous route.  Based on criteria approved by the Pharmacy and Therapeutics Committee, the antibiotic(s) is/are being converted to the equivalent oral dose form(s).  DESCRIPTION: These criteria include: Patient being treated for a respiratory tract infection, urinary tract infection, cellulitis or clostridium difficile associated diarrhea if on metronidazole The patient is not neutropenic and does not exhibit a GI malabsorption state The patient is eating (either orally or via tube) and/or has been taking other orally administered medications for a least 24 hours The patient is improving clinically and has a Tmax < 100.5  If you have questions about this conversion, please contact the Pharmacy Department   Tressie Ellis 11/07/22

## 2022-11-07 NOTE — Plan of Care (Signed)
  Problem: Education: Goal: Knowledge of General Education information will improve Description: Including pain rating scale, medication(s)/side effects and non-pharmacologic comfort measures Outcome: Progressing   Problem: Clinical Measurements: Goal: Ability to maintain clinical measurements within normal limits will improve Outcome: Progressing   

## 2022-11-08 DIAGNOSIS — A0472 Enterocolitis due to Clostridium difficile, not specified as recurrent: Secondary | ICD-10-CM | POA: Diagnosis not present

## 2022-11-08 DIAGNOSIS — N179 Acute kidney failure, unspecified: Secondary | ICD-10-CM | POA: Diagnosis not present

## 2022-11-08 DIAGNOSIS — E876 Hypokalemia: Secondary | ICD-10-CM | POA: Diagnosis not present

## 2022-11-08 DIAGNOSIS — E878 Other disorders of electrolyte and fluid balance, not elsewhere classified: Secondary | ICD-10-CM | POA: Diagnosis not present

## 2022-11-08 DIAGNOSIS — E43 Unspecified severe protein-calorie malnutrition: Secondary | ICD-10-CM | POA: Insufficient documentation

## 2022-11-08 MED ORDER — SODIUM BICARBONATE 650 MG PO TABS: 1300.0000 mg | ORAL_TABLET | Freq: Two times a day (BID) | ORAL | 0 refills | Status: DC

## 2022-11-08 MED ORDER — FIDAXOMICIN 200 MG PO TABS: 200.0000 mg | ORAL_TABLET | Freq: Two times a day (BID) | ORAL | 0 refills | Status: AC

## 2022-11-08 MED ORDER — FERROUS SULFATE 325 (65 FE) MG PO TABS: 325.0000 mg | ORAL_TABLET | Freq: Every day | ORAL | 3 refills | Status: DC

## 2022-11-08 MED ORDER — ADULT MULTIVITAMIN W/MINERALS CH: 1.0000 | ORAL_TABLET | Freq: Every day | ORAL | 3 refills | Status: DC

## 2022-11-08 MED ORDER — GERHARDT'S BUTT CREAM: 1.0000 | TOPICAL_CREAM | Freq: Three times a day (TID) | CUTANEOUS | 2 refills | Status: DC | PRN

## 2022-11-08 MED ORDER — ADULT MULTIVITAMIN W/MINERALS CH
1.0000 | ORAL_TABLET | Freq: Every day | ORAL | Status: DC
  Administered 2022-11-08: 1 via ORAL
  Filled 2022-11-08: qty 1

## 2022-11-08 MED ORDER — FAMOTIDINE 10 MG PO TABS: 10.0000 mg | ORAL_TABLET | Freq: Every day | ORAL | 3 refills | Status: DC

## 2022-11-08 MED ORDER — ZINC SULFATE 220 (50 ZN) MG PO CAPS: 220.0000 mg | ORAL_CAPSULE | Freq: Every day | ORAL | 0 refills | Status: DC

## 2022-11-08 MED ORDER — BOOST / RESOURCE BREEZE PO LIQD CUSTOM
1.0000 | Freq: Three times a day (TID) | ORAL | Status: DC
  Administered 2022-11-08: 1 via ORAL

## 2022-11-08 MED ORDER — ZINC SULFATE 220 (50 ZN) MG PO CAPS
220.0000 mg | ORAL_CAPSULE | Freq: Every day | ORAL | Status: DC
  Administered 2022-11-08: 220 mg via ORAL
  Filled 2022-11-08: qty 1

## 2022-11-08 MED ORDER — VITAMIN C 500 MG PO TABS
500.0000 mg | ORAL_TABLET | Freq: Two times a day (BID) | ORAL | Status: DC
  Administered 2022-11-08: 500 mg via ORAL
  Filled 2022-11-08: qty 1

## 2022-11-08 MED ORDER — NYSTATIN 100000 UNIT/GM EX POWD: Freq: Three times a day (TID) | CUTANEOUS | 0 refills | Status: DC

## 2022-11-08 MED ORDER — ASCORBIC ACID 500 MG PO TABS: 500.0000 mg | ORAL_TABLET | Freq: Every day | ORAL | 3 refills | Status: DC

## 2022-11-08 MED ORDER — SUCRALFATE 1 G PO TABS: 1.0000 g | ORAL_TABLET | Freq: Three times a day (TID) | ORAL | 0 refills | Status: DC

## 2022-11-08 NOTE — TOC Transition Note (Signed)
Transition of Care [facility]) - CM/SW Discharge Note   Patient Details  Name: [name] MRN: [id number] Date of Birth: [birth year]  Transition of [facility]) CM/SW Contact:  Garret Reddish, RN Phone Number: 11/08/2022, 1:30 PM   Clinical Narrative:  Chart reviewed.  I have received a call from patient's son [name].  He informs me that the hospital bed and air mattress and been received.    I have informed [name] that will arranged home health for wound care to sacrum.  [name] had no home health preference.  I have given home health referral to Amy with [facility].  Home Health services will be for RN for wound care.    [name] has requested EMS transport for discharge home today.    I have arranged ambulance transport through [facility].    I have informed staff nurse of the above information.      Final next level of care: Home w Home Health Services Barriers to Discharge: Barriers Resolved (Were awaiting Hospital bed delivery and mattress. Barrier has been resolved.)   Patient Goals and CMS Choice CMS Medicare.gov Compare Post Acute Care list provided to:: Patient Represenative (must comment) (Patient's son) Choice offered to / list presented to : [facility] POA / Guardian (Patient's son)  Discharge Placement                    Name of family member notified: Oveta Arboleda Patient and family notified of of transfer: 11/08/22  Discharge Plan and Services Additional resources added to the After Visit Summary for       Post Acute Care Choice: Home Health          DME Arranged: Hospital bed, Air overlay mattress DME Agency: [employer] Date DME Agency Contacted: 11/04/22   Representative spoke with at DME Agency: Esperanza Richters Arranged: RN [facility] Agency: [facility] Date [facility] Agency Contacted: 11/08/22 Time HH Agency Contacted: 1200 Representative spoke with at [facility] Agency: Amy  Social Determinants of Health (SDOH) Interventions SDOH Screenings   Food Insecurity: No Food  Insecurity (11/02/2022)  Housing: Low Risk  (11/02/2022)  Transportation Needs: No Transportation Needs (11/02/2022)  Utilities: Not At Risk (11/02/2022)  Alcohol Screen: Low Risk  (09/17/2022)  Depression (PHQ2-9): Low Risk  (09/17/2022)  Financial Resource Strain: Low Risk  (09/17/2022)  Physical Activity: Insufficiently Active (09/17/2022)  Social Connections: Moderately Integrated (09/17/2022)  Stress: No Stress Concern Present (09/17/2022)  Tobacco Use: Low Risk  (11/01/2022)     Readmission Risk Interventions     No data to display

## 2022-11-08 NOTE — Discharge Summary (Signed)
Physician Discharge Summary   Patient: [name] MRN: [id number] DOB: 1919-01-31  Admit date:     11/01/2022  Discharge date: 11/08/22  Discharge Physician: Marcelino Duster   PCP: Carylon Perches, MD   Recommendations at discharge:  {Tip this will not be part of the note when signed- Example include specific recommendations for outpatient follow-up, pending tests to follow-up on. (Optional):26781}  ***  Discharge Diagnoses: Principal Problem:   Acute kidney injury superimposed on CKD (HCC) Active Problems:   Abnormal blood electrolyte level   C. difficile colitis   Essential (primary) hypertension   Hypokalemia   Hyponatremia   Decubitus ulcer of buttock, stage 3 (HCC)   Hypomagnesemia   Iron deficiency anemia   Functional quadriplegia (HCC)   Protein-calorie malnutrition, severe  Resolved Problems:   * No resolved hospital problems. *  Hospital Course: 89 y.o. female with medical history significant for hypertension, embolism, SBO coming to [place] for abnormal electrolytes.  Patient was admitted on June 2024 for generalized weakness found to have a C. difficile infection.  Patient was discharged with p.o. vancomycin.  Patient is essentially bedridden and has an extensive infected decubitus ulcer has been unstageable.  Patient has been taking her vancomycin for C. difficile infection.  Per chart review patient has an infected sacral decub that was negative for osteomyelitis patient was discharged with 4 days of Doxy and cefdinir which she has completed.   The emergency room today patient is afebrile O2 sats of 100% on room air. Blood pressure stable at 144/53.  EKG shows sinus rhythm at 94 with a first-degree AV heart block and a PR interval of 254. Metabolic panel shows hypokalemia metabolic acidosis AKI hypomagnesemia CBC shows leukocytosis of 18.3 hemoglobin of 8.4 platelet counts of 626. Patient received supplementation for potassium and magnesium replacement in the  emergency room.  7/6.  Creatinine still elevated at 2.07.  Will continue IV fluids overnight.  Add sodium bicarb tablets.  Change p.o. vancomycin over to Dificid. 7/7.  Hemoglobin drifted down to 7.6 and creatinine lowered to 1.93.  Discontinued fluids.  Continue sodium bicarb tablets. 7/8.  Hemoglobin 7.7, creatinine 2.13.  Will continue sodium bicarb tablets at increased dose.  Will give fluid bolus. 7/9.  Creatinine 1.87.  Hemoglobin 7.6.  Still waiting on bed to be delivered.  Assessment and Plan: * Acute kidney injury superimposed on CKD (HCC) Acute kidney injury on CKD stage IV.  Baseline creatinine around 1.53.  Creatinine on admission 2.24 and today's creatinine 1.87.  Continue sodium bicarb tablets.    Abnormal blood electrolyte level Patient found to have hypokalemia hypomagnesemia hyponatremia secondary to decreased p.o. intake and GI losses.  Potassium up to 3.5.  Sodium 137.  Phosphorus 4.0, magnesium 2.1.  Continue potassium replacement with diarrhea.   C. difficile colitis Changed p.o. vancomycin to Dificid on 7/6.  I only see 1 bowel movement documented yesterday and 1 for today so far.   Functional quadriplegia [facility]) Patient is total care and needs to be fed.  Iron deficiency anemia Hemoglobin drifted down to 7.6 with IV fluid hydration.  Continue oral iron.  Decubitus ulcer of buttock, stage 3 (HCC) Present on admission.  Does not look infected.  Buttock/sacral decubiti.  Appreciate wound care evaluation.  Hypokalemia    Essential (primary) hypertension Continue patient on diltiazem and hydralazine.       {Tip this will not be part of the note when signed Body mass index is 18.27 kg/m. ,  Nutrition Documentation  Flowsheet Row ED to Hosp-Admission (Current) from 11/01/2022 in [facility] 1C MEDICAL TELEMETRY  Nutrition Problem Severe Malnutrition  Etiology social / environmental circumstances  Nutrition Goal Patient will meet  greater than or equal to 90% of their needs  Interventions MVI, Boost Breeze, Liberalize Diet     ,  Active Pressure Injury/Wound(s)     Pressure Ulcer  Duration          Pressure Injury 10/21/22 Sacrum Left;Upper Unstageable - Full thickness tissue loss in which the base of the injury is covered by slough (yellow, tan, gray, green or brown) and/or eschar (tan, brown or black) in the wound bed. nonbleeding    1 cm in dept 17 days           (Optional):26781}  {(NOTE) Pain control PDMP Statment (Optional):26782} Consultants: *** Procedures performed: ***  Disposition: {Plan; Disposition:26390} Diet recommendation:  {Diet_Plan:26776} DISCHARGE MEDICATION: Allergies as of 11/08/2022   No Known Allergies      Medication List     STOP taking these medications    cefdinir 300 MG capsule Commonly known as: OMNICEF   doxycycline 100 MG tablet Commonly known as: VIBRA-TABS   vancomycin 125 MG capsule Commonly known as: VANCOCIN       TAKE these medications    ascorbic acid 500 MG tablet Commonly known as: VITAMIN C Take 1 tablet (500 mg total) by mouth daily.   calcitRIOL 0.25 MCG capsule Commonly known as: ROCALTROL Take 0.25 mcg by mouth daily.   diltiazem 240 MG 24 hr capsule Commonly known as: CARDIZEM CD Take 240 mg by mouth daily.   famotidine 10 MG tablet Commonly known as: PEPCID Take 1 tablet (10 mg total) by mouth daily. Start taking on: November 09, 2022   ferrous sulfate 325 (65 FE) MG tablet Take 1 tablet (325 mg total) by mouth daily with breakfast. Start taking on: November 09, 2022   fidaxomicin 200 MG Tabs tablet Commonly known as: DIFICID Take 1 tablet (200 mg total) by mouth 2 (two) times daily for 4 days.   furosemide 20 MG tablet Commonly known as: LASIX Take 20 mg by mouth daily.   Gerhardt's butt cream Crea Apply 1 Application topically 3 (three) times daily as needed for irritation (apply to buttocks, groin, inner thighs, vaginal  area with nystatin powder).   hydrALAZINE 25 MG tablet Commonly known as: APRESOLINE Take 25 mg by mouth 2 (two) times daily.   multivitamin with minerals Tabs tablet Take 1 tablet by mouth daily.   nystatin powder Commonly known as: MYCOSTATIN/NYSTOP Apply topically 3 (three) times daily.   polyethylene glycol 17 g packet Commonly known as: MIRALAX / GLYCOLAX Take 17 g by mouth 2 (two) times daily.   sodium bicarbonate 650 MG tablet Take 2 tablets (1,300 mg total) by mouth 2 (two) times daily.   sucralfate 1 g tablet Commonly known as: CARAFATE Take 1 tablet (1 g total) by mouth 4 (four) times daily -  with meals and at bedtime.   zinc sulfate 220 (50 Zn) MG capsule Take 1 capsule (220 mg total) by mouth daily.               Durable Medical Equipment  (From admission, onward)           Start     Ordered   11/04/22 1618  For home use only DME Hospital bed  Once       Question Answer Comment  Length of Need Lifetime  Patient has (list medical condition): bedbound with decubitus ulcer   The above medical condition requires: Patient requires the ability to reposition frequently   Head must be elevated greater than: 30 degrees   Bed type Semi-electric   Support Surface: Low Air loss Mattress      11/04/22 1618              Discharge Care Instructions  (From admission, onward)           Start     Ordered   11/08/22 0000  Discharge wound care:       Comments: Wound care  Every 6 hours    Comments: Apply Medihoney to sacrum wound Q day, then cover with foam dressing.  Change foam dressing Q 3 days or PRN soiling.   11/08/22 1224            Discharge Exam: Filed Weights   11/03/22 0108 11/07/22 0403 11/08/22 0204  Weight: 44.3 kg 45.9 kg 45.3 kg   ***  Condition at discharge: {DC Condition:26389}  The results of significant diagnostics from this hospitalization (including imaging, microbiology, ancillary and laboratory) are listed  below for reference.   Imaging Studies: MR PELVIS W WO CONTRAST  Result Date: 10/21/2022 CLINICAL DATA:  Sacral wound. EXAM: MRI PELVIS WITHOUT AND WITH CONTRAST TECHNIQUE: Multiplanar multisequence MR imaging of the pelvis was performed both before and after administration of intravenous contrast. CONTRAST:  5mL GADAVIST GADOBUTROL 1 MMOL/ML IV SOLN COMPARISON:  CT abdomen pelvis dated August 16, 2012. FINDINGS: Urinary Tract:  No abnormality visualized. Bowel:  Sigmoid diverticulosis. Vascular/Lymphatic: No pathologically enlarged lymph nodes. No significant vascular abnormality seen. Reproductive:  Prior hysterectomy. Other: Chronic common bile duct dilatation status post cholecystectomy. Musculoskeletal: Sacral decubitus ulcer without underlying bony signal abnormality. Soft tissue enhancement around the decubitus ulcer without discrete fluid collection. Prior left hip arthroplasty. Small amount of fluid in the right greater trochanteric bursa. IMPRESSION: 1. Sacral decubitus ulcer without underlying osteomyelitis or discrete drainable abscess. Electronically Signed   By: Obie Dredge M.D.   On: 10/21/2022 16:29   DG Chest Port 1 View  Result Date: 10/21/2022 CLINICAL DATA:  Questionable sepsis. Weakness and diarrhea for 1 week. Patient is no longer able to ambulate with her walker. EXAM: PORTABLE CHEST 1 VIEW COMPARISON:  Two-view chest x-ray 06/05/2009 FINDINGS: The heart size is upper limits of normal. Atherosclerotic calcifications are present at the aortic arch. Changes of COPD are noted. No edema or effusion is present. No focal airspace disease is present. Asymmetric degenerative changes are present at the left shoulder. IMPRESSION: 1. No acute cardiopulmonary disease. 2. COPD. 3. Aortic atherosclerosis. Electronically Signed   By: Marin Roberts M.D.   On: 10/21/2022 13:23    Microbiology: Results for orders placed or performed during the hospital encounter of 10/21/22  Blood Culture  (routine x 2)     Status: None   Collection Time: 10/21/22 12:40 PM   Specimen: BLOOD  Result Value Ref Range Status   Specimen Description BLOOD BLOOD LEFT ARM  Final   Special Requests   Final    Blood Culture adequate volume BOTTLES DRAWN AEROBIC AND ANAEROBIC   Culture   Final    NO GROWTH 5 DAYS Performed at [facility], [address] [place], [place] [address]    Report Status 10/26/2022 FINAL  Final  Blood Culture (routine x 2)     Status: None   Collection Time: 10/21/22 12:45 PM   Specimen: BLOOD  Result Value Ref Range Status   Specimen Description BLOOD BLOOD RIGHT ARM  Final   Special Requests   Final    BOTTLES DRAWN AEROBIC AND ANAEROBIC Blood Culture results may not be optimal due to an excessive volume of blood received in culture bottles   Culture   Final    NO GROWTH 5 DAYS Performed at [facility], [address] [place], [place] [address]    Report Status 10/26/2022 FINAL  Final  C Difficile Quick Screen w PCR reflex     Status: Abnormal   Collection Time: 10/23/22  6:25 PM   Specimen: STOOL  Result Value Ref Range Status   C Diff antigen POSITIVE (A) NEGATIVE Final   C Diff toxin NEGATIVE NEGATIVE Final   C Diff interpretation Results are indeterminate. See PCR results.  Final    Comment: Performed at [facility], [address], [place], [place] [address]  Gastrointestinal Panel by PCR , Stool     Status: Abnormal   Collection Time: 10/23/22  6:25 PM   Specimen: Stool  Result Value Ref Range Status   Campylobacter species NOT DETECTED NOT DETECTED Final   Plesimonas shigelloides NOT DETECTED NOT DETECTED Final   Salmonella species NOT DETECTED NOT DETECTED Final   Yersinia enterocolitica NOT DETECTED NOT DETECTED Final   Vibrio species NOT DETECTED NOT DETECTED Final   Vibrio cholerae NOT DETECTED NOT DETECTED Final   Enteroaggregative E coli (EAEC) NOT DETECTED NOT DETECTED Final   Enteropathogenic E coli (EPEC) NOT DETECTED NOT DETECTED Final    Enterotoxigenic E coli (ETEC) NOT DETECTED NOT DETECTED Final   Shiga like toxin producing E coli (STEC) NOT DETECTED NOT DETECTED Final   Shigella/Enteroinvasive E coli (EIEC) NOT DETECTED NOT DETECTED Final   Cryptosporidium NOT DETECTED NOT DETECTED Final   Cyclospora cayetanensis NOT DETECTED NOT DETECTED Final   Entamoeba histolytica NOT DETECTED NOT DETECTED Final   Giardia lamblia NOT DETECTED NOT DETECTED Final   Adenovirus F40/41 NOT DETECTED NOT DETECTED Final   Astrovirus NOT DETECTED NOT DETECTED Final   Norovirus GI/GII DETECTED (A) NOT DETECTED Final    Comment: CRITICAL RESULT CALLED TO, READ BACK BY AND VERIFIED WITH:  C/ PATRICIA MILES AT 955 10/24/22 JG    Rotavirus A NOT DETECTED NOT DETECTED Final   Sapovirus (I, II, IV, and V) NOT DETECTED NOT DETECTED Final    Comment: Performed at [facility], [address] [place], [place] [address]  C. Diff by PCR, Reflexed     Status: Abnormal   Collection Time: 10/23/22  6:25 PM  Result Value Ref Range Status   Toxigenic C. Difficile by PCR POSITIVE (A) NEGATIVE Final    Comment: Positive for toxigenic C. difficile with little to no toxin production. Only treat if clinical presentation suggests symptomatic illness. Performed at [facility], [address], [place], [place] [address]     Labs: CBC: Recent Labs  Lab 11/01/22 2324 11/02/22 2956 11/03/22 0723 11/04/22 0547 11/05/22 0816 11/06/22 0535  WBC 18.3* 18.8* 16.3* 17.1*  --  14.0*  NEUTROABS 13.2*  --   --   --   --   --   HGB 8.4* 8.3* 7.6* 7.7* 7.6* 7.7*  HCT 25.4* 25.0* 23.2* 23.9*  --  22.9*  MCV 78.4* 77.9* 78.6* 81.3  --  77.9*  PLT 626* 614* 614* 575*  --  571*   Basic Metabolic Panel: Recent Labs  Lab 11/01/22 2324 11/02/22 0641 11/03/22 2130  11/04/22 0547 11/05/22 0816 11/06/22 0535  NA 134* 134* 134* 138 137 138  K 2.0* 3.5 3.0* 3.8 3.5 4.1  CL 107 108 110 115* 113* 114*  CO2 15* 16* 15* 17* 17* 16*  GLUCOSE 109* 102*  97 106* 94 88  BUN 31* 31* 29* 31* 31* 32*  CREATININE 2.24* 2.07* 1.93* 2.13* 1.87* 1.84*  CALCIUM 8.4* 8.2* 8.1* 8.6* 8.9 9.0  MG 1.6*  --  2.1  --   --   --   PHOS  --   --  2.4* 4.0  --   --    Liver Function Tests: Recent Labs  Lab 11/01/22 2324 11/02/22 0641  AST 22 16  ALT 17 16  ALKPHOS 69 65  BILITOT 0.7 0.6  PROT 5.7* 5.3*  ALBUMIN 2.3* 2.1*   CBG: Recent Labs  Lab 11/04/22 0047 11/04/22 0411  GLUCAP 116* 94    Discharge time spent: {LESS THAN/GREATER THAN:26388} 30 minutes.  Signed: Marcelino Duster, MD [facility] 11/08/2022

## 2022-11-08 NOTE — Plan of Care (Signed)

## 2022-11-08 NOTE — Progress Notes (Addendum)
Nutrition Follow-up  DOCUMENTATION CODES:   Underweight, Severe malnutrition in context of social or environmental circumstances  INTERVENTION:   -D/c Ensure Enlive po BID, each supplement provides 350 kcal and 20 grams of protein -Boost Breeze po TID, each supplement provides 250 kcal and 9 grams of protein  -Downgrade diet to dysphagia 2 for ease of intake -MVI with minerals daily -500 mg vitamin C BID -220 mg zinc sulfate daily x 14 days -Feeding assistance with meals  NUTRITION DIAGNOSIS:   Severe Malnutrition related to social / environmental circumstances as evidenced by moderate muscle depletion, severe muscle depletion, moderate fat depletion, severe fat depletion.  Ongoing  GOAL:   Patient will meet greater than or equal to 90% of their needs  Progressing   MONITOR:   PO intake, Supplement acceptance  REASON FOR ASSESSMENT:   Malnutrition Screening Tool    ASSESSMENT:   89 y.o. female admits related to abnormal labs. PMH includes: DDD, DVT, L hip fracture, HTN, osteoporosis, PE, SBO, sigmoid diverticulosis. Pt is currently receiving medical management related to AKI superimposed on CKD.  Reviewed I/O's: -400 ml x 24 hours and -2 L since admission  UOP: 700 ml x 24 hours   Case discussed with RN; pt is medically stable for discharge, but awaiting for delivery of hospital bed for home. Pt with fair intake. She is taking medications without difficulty when administered with applesauce.   Spoke with pt at bedside, who is hard of hearing, but communicates well when spoken to on her left side. Pt reports that she does not have much of an appetite secondary to taste changes ("nothing tastes right or doesn't taste at all"). Pt reports she could taste the applesauce in her medications without difficulty. She also shares that it is difficult for her to tolerate hard foods such as meats unless they are very tender. She has not eaten breakfast yet; RD obtained breakfast  order and ordered for her.   Pt shares that she does not tolerate milk products well, as they often give her diarrhea. Noted opened Ensure at bedside, which had only sips taken of. Pt shares she has not tried it yet, but is afraid that it will "run right through me". RD reviewed other supplements on the formulary and is willing to try Boost Breeze.   Pt unsure of UBW. She endorses wt loss, but unsure how much or time frame. Wt has been stable over the past month.   Discussed importance of good meal and supplement intake to promote healing.   Medications reviewed and include cardizem, ferrous sulfate, diflucan, potassium chloride, and carafate.   Labs reviewed: CBGS: 94.    NUTRITION - FOCUSED PHYSICAL EXAM:  Flowsheet Row Most Recent Value  Orbital Region Moderate depletion  Upper Arm Region Severe depletion  Thoracic and Lumbar Region Severe depletion  Buccal Region Moderate depletion  Temple Region Moderate depletion  Clavicle Bone Region Severe depletion  Clavicle and Acromion Bone Region Severe depletion  Scapular Bone Region Severe depletion  Dorsal Hand Severe depletion  Patellar Region Severe depletion  Anterior Thigh Region Severe depletion  Posterior Calf Region Severe depletion  Edema (RD Assessment) None  Hair Reviewed  Eyes Reviewed  Mouth Reviewed  Skin Reviewed  Nails Reviewed       Diet Order:   Diet Order             DIET SOFT Room service appropriate? Yes; Fluid consistency: Thin  Diet effective now  EDUCATION NEEDS:   Education needs have been addressed  Skin:  Skin Assessment: Skin Integrity Issues: Skin Integrity Issues:: Stage IV Unstageable: skin tear to lower rt arm  Last BM:  7/10 - type 7  Height:   Ht Readings from Last 1 Encounters:  11/02/22 5\' 2"  (1.575 m)   Weight:   Wt Readings from Last 1 Encounters:  11/08/22 45.3 kg   BMI:  Body mass index is 18.27 kg/m.  Estimated Nutritional Needs:   Kcal:   1350-1550  Protein:  65-80 grams  Fluid:  > 1.3 L    Levada Schilling, RD, LDN, CDCES Registered Dietitian II Certified Diabetes Care and Education Specialist Please refer to [facility] for RD and/or RD on-call/weekend/after hours pager

## 2022-11-10 NOTE — TOC CM/SW Note (Addendum)
11/10/22--- Notified by Coralee North with [facility] that she found out after DC that they are unable to accept patient due to no nursing in the patient's area.  Notified TOC Supervisor.   Per initial note they had a referral to [facility], reached out to [place] with [facility] who confirms they can staff patient for [facility] services and will follow up with patient/family.   Alfonso Ramus, LCSW Transitions of Care Department [phone]

## 2022-11-11 DIAGNOSIS — D509 Iron deficiency anemia, unspecified: Secondary | ICD-10-CM | POA: Diagnosis not present

## 2022-11-11 DIAGNOSIS — E559 Vitamin D deficiency, unspecified: Secondary | ICD-10-CM | POA: Diagnosis not present

## 2022-11-11 DIAGNOSIS — I10 Essential (primary) hypertension: Secondary | ICD-10-CM | POA: Diagnosis not present

## 2022-11-11 DIAGNOSIS — M1990 Unspecified osteoarthritis, unspecified site: Secondary | ICD-10-CM | POA: Diagnosis not present

## 2022-11-11 DIAGNOSIS — K219 Gastro-esophageal reflux disease without esophagitis: Secondary | ICD-10-CM | POA: Diagnosis not present

## 2022-11-11 DIAGNOSIS — L89159 Pressure ulcer of sacral region, unspecified stage: Secondary | ICD-10-CM | POA: Diagnosis not present

## 2022-11-11 DIAGNOSIS — I509 Heart failure, unspecified: Secondary | ICD-10-CM | POA: Diagnosis not present

## 2022-11-11 DIAGNOSIS — R32 Unspecified urinary incontinence: Secondary | ICD-10-CM | POA: Diagnosis not present

## 2022-11-11 DIAGNOSIS — H9193 Unspecified hearing loss, bilateral: Secondary | ICD-10-CM | POA: Diagnosis not present

## 2022-11-11 DIAGNOSIS — R269 Unspecified abnormalities of gait and mobility: Secondary | ICD-10-CM | POA: Diagnosis not present

## 2022-11-11 DIAGNOSIS — I130 Hypertensive heart and chronic kidney disease with heart failure and stage 1 through stage 4 chronic kidney disease, or unspecified chronic kidney disease: Secondary | ICD-10-CM | POA: Diagnosis not present

## 2022-11-11 DIAGNOSIS — N184 Chronic kidney disease, stage 4 (severe): Secondary | ICD-10-CM | POA: Diagnosis not present

## 2022-11-18 DIAGNOSIS — A0471 Enterocolitis due to Clostridium difficile, recurrent: Secondary | ICD-10-CM | POA: Diagnosis not present

## 2022-11-18 DIAGNOSIS — N184 Chronic kidney disease, stage 4 (severe): Secondary | ICD-10-CM | POA: Diagnosis not present

## 2022-11-18 DIAGNOSIS — L89310 Pressure ulcer of right buttock, unstageable: Secondary | ICD-10-CM | POA: Diagnosis not present

## 2022-11-18 DIAGNOSIS — I10 Essential (primary) hypertension: Secondary | ICD-10-CM | POA: Diagnosis not present

## 2022-12-09 DIAGNOSIS — L89323 Pressure ulcer of left buttock, stage 3: Secondary | ICD-10-CM | POA: Diagnosis not present

## 2022-12-09 DIAGNOSIS — M6281 Muscle weakness (generalized): Secondary | ICD-10-CM | POA: Diagnosis not present

## 2022-12-11 ENCOUNTER — Inpatient Hospital Stay (HOSPITAL_COMMUNITY)
Admission: EM | Admit: 2022-12-11 | Discharge: 2022-12-16 | DRG: 592 | Disposition: A | Discharge status: Hospice | Payer: PPO | Attending: Family Medicine | Admitting: Family Medicine

## 2022-12-11 ENCOUNTER — Other Ambulatory Visit: Payer: Self-pay

## 2022-12-11 ENCOUNTER — Encounter (HOSPITAL_COMMUNITY): Payer: Self-pay

## 2022-12-11 DIAGNOSIS — Z8542 Personal history of malignant neoplasm of other parts of uterus: Secondary | ICD-10-CM

## 2022-12-11 DIAGNOSIS — R532 Functional quadriplegia: Secondary | ICD-10-CM | POA: Diagnosis present

## 2022-12-11 DIAGNOSIS — Z79899 Other long term (current) drug therapy: Secondary | ICD-10-CM

## 2022-12-11 DIAGNOSIS — I959 Hypotension, unspecified: Secondary | ICD-10-CM | POA: Diagnosis not present

## 2022-12-11 DIAGNOSIS — Z515 Encounter for palliative care: Secondary | ICD-10-CM | POA: Diagnosis not present

## 2022-12-11 DIAGNOSIS — Z86711 Personal history of pulmonary embolism: Secondary | ICD-10-CM

## 2022-12-11 DIAGNOSIS — Z90710 Acquired absence of both cervix and uterus: Secondary | ICD-10-CM

## 2022-12-11 DIAGNOSIS — R627 Adult failure to thrive: Secondary | ICD-10-CM | POA: Diagnosis present

## 2022-12-11 DIAGNOSIS — Z66 Do not resuscitate: Secondary | ICD-10-CM | POA: Diagnosis not present

## 2022-12-11 DIAGNOSIS — K529 Noninfective gastroenteritis and colitis, unspecified: Secondary | ICD-10-CM | POA: Diagnosis not present

## 2022-12-11 DIAGNOSIS — E876 Hypokalemia: Secondary | ICD-10-CM | POA: Diagnosis present

## 2022-12-11 DIAGNOSIS — S31000A Unspecified open wound of lower back and pelvis without penetration into retroperitoneum, initial encounter: Secondary | ICD-10-CM | POA: Diagnosis not present

## 2022-12-11 DIAGNOSIS — I509 Heart failure, unspecified: Secondary | ICD-10-CM | POA: Diagnosis present

## 2022-12-11 DIAGNOSIS — L89150 Pressure ulcer of sacral region, unstageable: Secondary | ICD-10-CM | POA: Diagnosis not present

## 2022-12-11 DIAGNOSIS — E860 Dehydration: Secondary | ICD-10-CM | POA: Diagnosis present

## 2022-12-11 DIAGNOSIS — I110 Hypertensive heart disease with heart failure: Secondary | ICD-10-CM | POA: Diagnosis not present

## 2022-12-11 DIAGNOSIS — R0902 Hypoxemia: Secondary | ICD-10-CM | POA: Diagnosis not present

## 2022-12-11 DIAGNOSIS — R54 Age-related physical debility: Secondary | ICD-10-CM | POA: Diagnosis present

## 2022-12-11 DIAGNOSIS — Z9049 Acquired absence of other specified parts of digestive tract: Secondary | ICD-10-CM | POA: Diagnosis not present

## 2022-12-11 DIAGNOSIS — M810 Age-related osteoporosis without current pathological fracture: Secondary | ICD-10-CM | POA: Diagnosis present

## 2022-12-11 DIAGNOSIS — L89153 Pressure ulcer of sacral region, stage 3: Secondary | ICD-10-CM | POA: Diagnosis not present

## 2022-12-11 DIAGNOSIS — I10 Essential (primary) hypertension: Secondary | ICD-10-CM | POA: Diagnosis not present

## 2022-12-11 DIAGNOSIS — D509 Iron deficiency anemia, unspecified: Secondary | ICD-10-CM | POA: Diagnosis present

## 2022-12-11 DIAGNOSIS — R410 Disorientation, unspecified: Secondary | ICD-10-CM | POA: Diagnosis present

## 2022-12-11 DIAGNOSIS — H9190 Unspecified hearing loss, unspecified ear: Secondary | ICD-10-CM | POA: Diagnosis not present

## 2022-12-11 DIAGNOSIS — H547 Unspecified visual loss: Secondary | ICD-10-CM | POA: Diagnosis present

## 2022-12-11 DIAGNOSIS — Z681 Body mass index (BMI) 19 or less, adult: Secondary | ICD-10-CM | POA: Diagnosis not present

## 2022-12-11 DIAGNOSIS — Z7189 Other specified counseling: Secondary | ICD-10-CM | POA: Diagnosis not present

## 2022-12-11 DIAGNOSIS — K922 Gastrointestinal hemorrhage, unspecified: Secondary | ICD-10-CM | POA: Diagnosis present

## 2022-12-11 DIAGNOSIS — D649 Anemia, unspecified: Secondary | ICD-10-CM | POA: Diagnosis not present

## 2022-12-11 DIAGNOSIS — Z8719 Personal history of other diseases of the digestive system: Secondary | ICD-10-CM

## 2022-12-11 DIAGNOSIS — K921 Melena: Secondary | ICD-10-CM | POA: Diagnosis present

## 2022-12-11 DIAGNOSIS — Z923 Personal history of irradiation: Secondary | ICD-10-CM

## 2022-12-11 DIAGNOSIS — Z96642 Presence of left artificial hip joint: Secondary | ICD-10-CM | POA: Diagnosis present

## 2022-12-11 LAB — COMPREHENSIVE METABOLIC PANEL
ALT: 11 U/L (ref 0–44)
AST: 13 U/L — ABNORMAL LOW (ref 15–41)
Albumin: 1.7 g/dL — ABNORMAL LOW (ref 3.5–5.0)
Alkaline Phosphatase: 66 U/L (ref 38–126)
Anion gap: 12 (ref 5–15)
BUN: 57 mg/dL — ABNORMAL HIGH (ref 8–23)
CO2: 31 mmol/L (ref 22–32)
Calcium: 8.6 mg/dL — ABNORMAL LOW (ref 8.9–10.3)
Chloride: 90 mmol/L — ABNORMAL LOW (ref 98–111)
Creatinine, Ser: 1.8 mg/dL — ABNORMAL HIGH (ref 0.44–1.00)
GFR, Estimated: 25 mL/min — ABNORMAL LOW (ref >60)
Glucose, Bld: 98 mg/dL (ref 70–99)
Potassium: 2 mmol/L — CL (ref 3.5–5.1)
Sodium: 133 mmol/L — ABNORMAL LOW (ref 135–145)
Total Bilirubin: 0.4 mg/dL (ref 0.3–1.2)
Total Protein: 5.1 g/dL — ABNORMAL LOW (ref 6.5–8.1)

## 2022-12-11 LAB — URINALYSIS, ROUTINE W REFLEX MICROSCOPIC

## 2022-12-11 LAB — URINALYSIS, MICROSCOPIC (REFLEX)
Squamous Epithelial / HPF: NONE SEEN /HPF (ref 0–5)
WBC, UA: >50 WBC/hpf (ref 0–5)

## 2022-12-11 LAB — CBC WITH DIFFERENTIAL/PLATELET
Abs Immature Granulocytes: 0.11 10*3/uL — ABNORMAL HIGH (ref 0.00–0.07)
Basophils Absolute: 0 10*3/uL (ref 0.0–0.1)
Basophils Relative: 0 %
Eosinophils Absolute: 0.2 10*3/uL (ref 0.0–0.5)
Eosinophils Relative: 1 %
HCT: 19.2 % — ABNORMAL LOW (ref 36.0–46.0)
Hemoglobin: 6.1 g/dL — CL (ref 12.0–15.0)
Immature Granulocytes: 1 %
Lymphocytes Relative: 7 %
Lymphs Abs: 1.3 10*3/uL (ref 0.7–4.0)
MCH: 24.1 pg — ABNORMAL LOW (ref 26.0–34.0)
MCHC: 31.8 g/dL (ref 30.0–36.0)
MCV: 75.9 fL — ABNORMAL LOW (ref 80.0–100.0)
Monocytes Absolute: 0.7 10*3/uL (ref 0.1–1.0)
Monocytes Relative: 4 %
Neutro Abs: 15 10*3/uL — ABNORMAL HIGH (ref 1.7–7.7)
Neutrophils Relative %: 87 %
Platelets: 723 10*3/uL — ABNORMAL HIGH (ref 150–400)
RBC: 2.53 MIL/uL — ABNORMAL LOW (ref 3.87–5.11)
RDW: 15.6 % — ABNORMAL HIGH (ref 11.5–15.5)
WBC: 17.4 10*3/uL — ABNORMAL HIGH (ref 4.0–10.5)
nRBC: 0 % (ref 0.0–0.2)

## 2022-12-11 LAB — CBC
HCT: 21.9 % — ABNORMAL LOW (ref 36.0–46.0)
Hemoglobin: 7.3 g/dL — ABNORMAL LOW (ref 12.0–15.0)
MCH: 26.4 pg (ref 26.0–34.0)
MCHC: 33.3 g/dL (ref 30.0–36.0)
MCV: 79.1 fL — ABNORMAL LOW (ref 80.0–100.0)
Platelets: 609 10*3/uL — ABNORMAL HIGH (ref 150–400)
RBC: 2.77 MIL/uL — ABNORMAL LOW (ref 3.87–5.11)
RDW: 17.9 % — ABNORMAL HIGH (ref 11.5–15.5)
WBC: 20.3 10*3/uL — ABNORMAL HIGH (ref 4.0–10.5)
nRBC: 0 % (ref 0.0–0.2)

## 2022-12-11 LAB — TROPONIN I (HIGH SENSITIVITY): Troponin I (High Sensitivity): 38 ng/L — ABNORMAL HIGH (ref <18)

## 2022-12-11 LAB — PHOSPHORUS: Phosphorus: 3 mg/dL (ref 2.5–4.6)

## 2022-12-11 LAB — MAGNESIUM: Magnesium: 1.8 mg/dL (ref 1.7–2.4)

## 2022-12-11 LAB — PREPARE RBC (CROSSMATCH)

## 2022-12-11 MED ORDER — HYDROMORPHONE HCL 1 MG/ML IJ SOLN
0.2500 mg | INTRAMUSCULAR | Status: DC | PRN
  Administered 2022-12-12 – 2022-12-13 (×3): 0.25 mg via INTRAVENOUS
  Filled 2022-12-11 (×3): qty 0.5

## 2022-12-11 MED ORDER — POTASSIUM CHLORIDE 20 MEQ PO PACK
40.0000 meq | PACK | Freq: Once | ORAL | Status: AC
  Administered 2022-12-11: 40 meq via ORAL
  Filled 2022-12-11: qty 2

## 2022-12-11 MED ORDER — SODIUM CHLORIDE 0.9% FLUSH
3.0000 mL | Freq: Two times a day (BID) | INTRAVENOUS | Status: DC
  Administered 2022-12-11 – 2022-12-15 (×7): 3 mL via INTRAVENOUS

## 2022-12-11 MED ORDER — PANTOPRAZOLE SODIUM 40 MG IV SOLR
40.0000 mg | Freq: Once | INTRAVENOUS | Status: AC
  Administered 2022-12-11: 40 mg via INTRAVENOUS
  Filled 2022-12-11: qty 10

## 2022-12-11 MED ORDER — NYSTATIN 100000 UNIT/GM EX POWD
Freq: Three times a day (TID) | CUTANEOUS | Status: DC
  Filled 2022-12-11 (×3): qty 15

## 2022-12-11 MED ORDER — VANCOMYCIN HCL IN DEXTROSE 1-5 GM/200ML-% IV SOLN
1000.0000 mg | Freq: Once | INTRAVENOUS | Status: AC
  Administered 2022-12-11: 1000 mg via INTRAVENOUS
  Filled 2022-12-11: qty 200

## 2022-12-11 MED ORDER — SODIUM CHLORIDE 0.9 % IV SOLN: INTRAVENOUS | Status: DC

## 2022-12-11 MED ORDER — ZINC SULFATE 220 (50 ZN) MG PO CAPS
220.0000 mg | ORAL_CAPSULE | Freq: Every day | ORAL | Status: DC
  Administered 2022-12-13 – 2022-12-15 (×3): 220 mg via ORAL
  Filled 2022-12-11 (×4): qty 1

## 2022-12-11 MED ORDER — SODIUM BICARBONATE 650 MG PO TABS
1300.0000 mg | ORAL_TABLET | Freq: Two times a day (BID) | ORAL | Status: DC
  Administered 2022-12-11: 1300 mg via ORAL
  Filled 2022-12-11: qty 2

## 2022-12-11 MED ORDER — VANCOMYCIN HCL 500 MG/100ML IV SOLN: 500.0000 mg | INTRAVENOUS | Status: DC

## 2022-12-11 MED ORDER — MORPHINE SULFATE (PF) 2 MG/ML IV SOLN
INTRAVENOUS | Status: AC
  Administered 2022-12-11: 2 mg via INTRAVENOUS
  Filled 2022-12-11: qty 1

## 2022-12-11 MED ORDER — SODIUM CHLORIDE 0.9 % IV BOLUS
500.0000 mL | Freq: Once | INTRAVENOUS | Status: AC
  Administered 2022-12-11: 500 mL via INTRAVENOUS

## 2022-12-11 MED ORDER — SODIUM CHLORIDE 0.9% IV SOLUTION: Freq: Once | INTRAVENOUS | Status: DC

## 2022-12-11 MED ORDER — POTASSIUM CHLORIDE 10 MEQ/100ML IV SOLN
10.0000 meq | INTRAVENOUS | Status: AC
  Administered 2022-12-12 (×3): 10 meq via INTRAVENOUS
  Filled 2022-12-11 (×3): qty 100

## 2022-12-11 MED ORDER — POTASSIUM CHLORIDE 20 MEQ PO PACK
60.0000 meq | PACK | Freq: Once | ORAL | Status: AC
  Administered 2022-12-11: 60 meq via ORAL
  Filled 2022-12-11: qty 3

## 2022-12-11 MED ORDER — HYDROMORPHONE HCL 1 MG/ML IJ SOLN: 0.2500 mg | INTRAMUSCULAR | Status: DC | PRN

## 2022-12-11 MED ORDER — MORPHINE SULFATE (PF) 2 MG/ML IV SOLN: 2.0000 mg | INTRAVENOUS | Status: DC | PRN

## 2022-12-11 MED ORDER — POTASSIUM CHLORIDE 10 MEQ/100ML IV SOLN
10.0000 meq | INTRAVENOUS | Status: AC
  Administered 2022-12-11 (×2): 10 meq via INTRAVENOUS
  Filled 2022-12-11: qty 100

## 2022-12-11 MED ORDER — FUROSEMIDE 20 MG PO TABS: 20.0000 mg | ORAL_TABLET | Freq: Every day | ORAL | Status: DC

## 2022-12-11 MED ORDER — CALCITRIOL 0.25 MCG PO CAPS
0.2500 ug | ORAL_CAPSULE | Freq: Every day | ORAL | Status: DC
  Administered 2022-12-13 – 2022-12-14 (×2): 0.25 ug via ORAL
  Filled 2022-12-11 (×3): qty 1

## 2022-12-11 MED ORDER — PIPERACILLIN-TAZOBACTAM IN DEX 2-0.25 GM/50ML IV SOLN
2.2500 g | Freq: Three times a day (TID) | INTRAVENOUS | Status: DC
  Administered 2022-12-11 – 2022-12-12 (×2): 2.25 g via INTRAVENOUS
  Filled 2022-12-11 (×6): qty 50

## 2022-12-11 MED ORDER — POLYETHYLENE GLYCOL 3350 17 G PO PACK: 17.0000 g | PACK | Freq: Every day | ORAL | Status: DC | PRN

## 2022-12-11 MED ORDER — SODIUM CHLORIDE 0.9 % IV SOLN
1.0000 g | Freq: Once | INTRAVENOUS | Status: AC
  Administered 2022-12-11: 1 g via INTRAVENOUS
  Filled 2022-12-11: qty 10

## 2022-12-11 MED ORDER — ACETAMINOPHEN 650 MG RE SUPP: 650.0000 mg | Freq: Four times a day (QID) | RECTAL | Status: DC | PRN

## 2022-12-11 MED ORDER — PANTOPRAZOLE SODIUM 40 MG IV SOLR
40.0000 mg | Freq: Two times a day (BID) | INTRAVENOUS | Status: DC
  Administered 2022-12-12 – 2022-12-14 (×5): 40 mg via INTRAVENOUS
  Filled 2022-12-11 (×5): qty 10

## 2022-12-11 MED ORDER — ACETAMINOPHEN 325 MG PO TABS
650.0000 mg | ORAL_TABLET | Freq: Four times a day (QID) | ORAL | Status: DC | PRN
  Administered 2022-12-13: 650 mg via ORAL
  Filled 2022-12-11: qty 2

## 2022-12-11 MED ORDER — POTASSIUM CHLORIDE 10 MEQ/100ML IV SOLN
INTRAVENOUS | Status: AC
  Administered 2022-12-11: 10 meq via INTRAVENOUS
  Filled 2022-12-11: qty 200

## 2022-12-11 MED ORDER — CHLORHEXIDINE GLUCONATE CLOTH 2 % EX PADS
6.0000 | MEDICATED_PAD | Freq: Every day | CUTANEOUS | Status: DC
  Administered 2022-12-11 – 2022-12-15 (×5): 6 via TOPICAL

## 2022-12-11 NOTE — ED Triage Notes (Signed)
Pt brought in from home by [facility] with c/o "APS hold" per Kym Groom [facility] DSS worker. Her phone number is [phone]. Lyla Son called Consulting civil engineer and reported that pt is not safe to be home alone without care 24/7. She is getting conflicting stories between pt's son and her aides as to whether or not she has care overnight. Per her report, the nurse aides are coming in in the morning and c/o that pt is alone, soiled, her mouth is so dry that it is "stuck together", and she has bedsores. Lyla Son is concerned medically that she may be dehydrated but also reports she was sent here because it isn't safe for her to return home until DSS can decide whether or not she is getting 24/7 care. An APS report has been started PTA per [facility]

## 2022-12-11 NOTE — ED Triage Notes (Signed)
Pt brought in from [facility] for placement. Pt has a open pressure sore on bottom that per EMS is well maintained. Pt is suppose to have 24/7 care and lives next door to son who checks on her when he can. Dr. Ouida Sills is out of the office but per EMS APS wants pt held at ED until [facility] can be filled out. Son talked to [facility] and needs FL2 filled out for placement. Son also informed EMS that she will be on hospice care at the end of the month.

## 2022-12-11 NOTE — Assessment & Plan Note (Signed)
This diagnosis is based on report of melanotic stools by family as well as finding of severe anemia.  Anemia severe enough that it warrants transfusion, to which the family has consented.  Patient is pending 1 unit PRBC transfusion.  We will target a hemoglobin of at least 7 tonight.  If not 8.  I will recheck CBC at around midnight.  I will give gentle hydration to this lady with normal saline at 75 cc an hour.  I will treat with pantoprazole.  There is no marked LFT abnormality to suggest variceal bleeding.  I will not start the patient on octreotide.  Case has been discussed with Dr. Jena Gauss of GI by ER attending who will see the patient in the morning.

## 2022-12-11 NOTE — Progress Notes (Signed)
Pharmacy Antibiotic Note  [name] is a 89 y.o. female admitted on 12/11/2022 with  wound infection .  Pharmacy has been consulted for piperacillin/tazobactam dosing. Patent already on vancomycin.   Today, 12/11/22 -CrCl 11 mL/min  Plan: -Piperacillin/tazobactam 2.25 g IV q8h -Monitor renal function  Height: 5\' 2"  (157.5 cm) Weight: 45.3 kg (99 lb 13.9 oz) IBW/kg (Calculated) : 50.1  Temp (24hrs), Avg:97.8 F (36.6 C), Min:97.5 F (36.4 C), Max:98 F (36.7 C)  Recent Labs  Lab 12/11/22 1509  WBC 17.4*  CREATININE 1.80*    Estimated Creatinine Clearance: 11.3 mL/min (A) (by C-G formula based on SCr of 1.8 mg/dL (H)).    No Known Allergies  [name] [facility] 12/11/2022 7:41 PM

## 2022-12-11 NOTE — ED Provider Notes (Signed)
[place] EMERGENCY DEPARTMENT AT [facility] Provider Note   CSN: [id number] Arrival date & time: 12/11/22  1253     History  Chief Complaint  Patient presents with   Placement    [name] is a 89 y.o. female.  Has PMH of a sacral decubitus ulcer, chronic diarrhea after bowel resection, functional quadriplegia, pulmonary embolism. She is the ER today primarily due to an open pressure sore on her sacral area that has been increasingly painful, is addressed daily by her home health aides.  A DSS worker has already started Adult Protective Services case, the patient does not always have overnight care in the home and she needs 24/7 care.  Her daughter is at bedside, she states she visits her frequently, has noted that she is having increased pain especially when they clean her sacral ulcer.  She has also developed a wound on her left heel. Patient's daughter states one of her brothers lives next-door and is her Education officer, environmental.   Reports that in her life she had to be admitted to [facility] due to hypokalemia and dehydration,  HPI     Home Medications Prior to Admission medications   Medication Sig Start Date End Date Taking? Authorizing Provider  ascorbic acid (VITAMIN C) 500 MG tablet Take 1 tablet (500 mg total) by mouth daily. 11/08/22   Marcelino Duster, MD  calcitRIOL (ROCALTROL) 0.25 MCG capsule Take 0.25 mcg by mouth daily. 10/03/22   [provider]  diltiazem (CARDIZEM CD) 240 MG 24 hr capsule Take 240 mg by mouth daily. 07/23/22   [provider]  famotidine (PEPCID) 10 MG tablet Take 1 tablet (10 mg total) by mouth daily. 11/09/22   Marcelino Duster, MD  ferrous sulfate 325 (65 FE) MG tablet Take 1 tablet (325 mg total) by mouth daily with breakfast. 11/09/22   Marcelino Duster, MD  furosemide (LASIX) 20 MG tablet Take 20 mg by mouth daily. 08/31/22   [provider]  hydrALAZINE (APRESOLINE) 25 MG tablet  Take 25 mg by mouth 2 (two) times daily. 07/27/22   [provider]  Multiple Vitamin (MULTIVITAMIN WITH MINERALS) TABS tablet Take 1 tablet by mouth daily. 11/08/22   Marcelino Duster, MD  Nystatin (GERHARDT'S BUTT CREAM) CREA Apply 1 Application topically 3 (three) times daily as needed for irritation (apply to buttocks, groin, inner thighs, vaginal area with nystatin powder). 11/08/22   Marcelino Duster, MD  nystatin (MYCOSTATIN/NYSTOP) powder Apply topically 3 (three) times daily. 11/08/22   Marcelino Duster, MD  polyethylene glycol (MIRALAX / GLYCOLAX) 17 g packet Take 17 g by mouth 2 (two) times daily.    [provider]  sodium bicarbonate 650 MG tablet Take 2 tablets (1,300 mg total) by mouth 2 (two) times daily. 11/08/22   Marcelino Duster, MD  sucralfate (CARAFATE) 1 g tablet Take 1 tablet (1 g total) by mouth 4 (four) times daily -  with meals and at bedtime. 11/08/22   Marcelino Duster, MD  zinc sulfate 220 (50 Zn) MG capsule Take 1 capsule (220 mg total) by mouth daily. 11/08/22   Marcelino Duster, MD      Allergies    Patient has no known allergies.    Review of Systems   Review of Systems  Physical Exam Updated Vital Signs BP (!) 117/41 (BP Location: Left Arm)   Pulse 68   Temp 97.8 F (36.6 C) (Oral)   Resp 18   Ht 5\' 2"  (1.575 m)   Wt  45.3 kg   SpO2 91%   BMI 18.27 kg/m  Physical Exam Vitals and nursing note reviewed.  Constitutional:      General: She is not in acute distress.    Appearance: She is well-developed.  HENT:     Head: Normocephalic and atraumatic.     Mouth/Throat:     Mouth: Mucous membranes are moist.  Eyes:     Conjunctiva/sclera: Conjunctivae normal.  Cardiovascular:     Rate and Rhythm: Normal rate and regular rhythm.     Heart sounds: No murmur heard. Pulmonary:     Effort: Pulmonary effort is normal. No respiratory distress.     Breath sounds: Normal breath sounds.  Abdominal:     Palpations:  Abdomen is soft.     Tenderness: There is no abdominal tenderness.  Musculoskeletal:        General: No swelling.     Cervical back: Neck supple.     Comments: Stage 3 pressure ulcer to sacrum. Intact blister to left heel  Skin:    General: Skin is warm and dry.     Capillary Refill: Capillary refill takes less than 2 seconds.  Neurological:     General: No focal deficit present.     Mental Status: She is alert and oriented to person, place, and time.  Psychiatric:        Mood and Affect: Mood normal.     ED Results / Procedures / Treatments   Labs (all labs ordered are listed, but only abnormal results are displayed) Labs Reviewed  COMPREHENSIVE METABOLIC PANEL - Abnormal; Notable for the following components:      Result Value   Sodium 133 (*)    Potassium 2.0 (*)    Chloride 90 (*)    BUN 57 (*)    Creatinine, Ser 1.80 (*)    Calcium 8.6 (*)    Total Protein 5.1 (*)    Albumin 1.7 (*)    AST 13 (*)    GFR, Estimated 25 (*)    All other components within normal limits  CBC WITH DIFFERENTIAL/PLATELET - Abnormal; Notable for the following components:   WBC 17.4 (*)    RBC 2.53 (*)    Hemoglobin 6.1 (*)    HCT 19.2 (*)    MCV 75.9 (*)    MCH 24.1 (*)    RDW 15.6 (*)    Platelets 723 (*)    Neutro Abs 15.0 (*)    Abs Immature Granulocytes 0.11 (*)    All other components within normal limits  URINALYSIS, ROUTINE W REFLEX MICROSCOPIC - Abnormal; Notable for the following components:   APPearance TURBID (*)    Glucose, UA RESULTS UNAVAILABLE DUE TO INTERFERING SUBSTANCE (*)    Hgb urine dipstick RESULTS UNAVAILABLE DUE TO INTERFERING SUBSTANCE (*)    Bilirubin Urine RESULTS UNAVAILABLE DUE TO INTERFERING SUBSTANCE (*)    Ketones, ur RESULTS UNAVAILABLE DUE TO INTERFERING SUBSTANCE (*)    Protein, ur RESULTS UNAVAILABLE DUE TO INTERFERING SUBSTANCE (*)    Nitrite RESULTS UNAVAILABLE DUE TO INTERFERING SUBSTANCE (*)    Leukocytes,Ua RESULTS UNAVAILABLE DUE TO  INTERFERING SUBSTANCE (*)    All other components within normal limits  URINALYSIS, MICROSCOPIC (REFLEX) - Abnormal; Notable for the following components:   Bacteria, UA MANY (*)    All other components within normal limits  CULTURE, BLOOD (ROUTINE X 2)  CULTURE, BLOOD (ROUTINE X 2)  URINE CULTURE  MAGNESIUM  PHOSPHORUS  URINALYSIS, ROUTINE W REFLEX MICROSCOPIC  TYPE AND SCREEN  PREPARE RBC (CROSSMATCH)    EKG None  Radiology No results found.  Procedures .Critical Care  Performed by: Ma Rings, PA-C Authorized by: Ma Rings, PA-C   Critical care provider statement:    Critical care time (minutes):  30   Critical care was necessary to treat or prevent imminent or life-threatening deterioration of the following conditions:  Circulatory failure   Critical care was time spent personally by me on the following activities:  Development of treatment plan with patient or surrogate, discussions with consultants, evaluation of patient's response to treatment, examination of patient, ordering and review of laboratory studies, ordering and review of radiographic studies, ordering and performing treatments and interventions, pulse oximetry, re-evaluation of patient's condition, review of old charts and obtaining history from patient or surrogate   Care discussed with comment:  Dr. Jena Gauss, Dr. Maryjean Ka     Medications Ordered in ED Medications  vancomycin (VANCOCIN) IVPB 1000 mg/200 mL premix (has no administration in time range)  0.9 %  sodium chloride infusion (Manually program via Guardrails IV Fluids) (has no administration in time range)  potassium chloride 10 mEq in 100 mL IVPB (has no administration in time range)  potassium chloride (KLOR-CON) packet 40 mEq (has no administration in time range)  cefTRIAXone (ROCEPHIN) 1 g in sodium chloride 0.9 % 100 mL IVPB (1 g Intravenous New Bag/Given 12/11/22 1650)  vancomycin (VANCOREADY) IVPB 500 mg/100 mL (has no administration  in time range)  pantoprazole (PROTONIX) injection 40 mg (40 mg Intravenous Given 12/11/22 1645)  sodium chloride 0.9 % bolus 500 mL (500 mLs Intravenous Bolus 12/11/22 1644)    ED Course/ Medical Decision Making/ A&P                                 Medical Decision Making This patient presents to the ED for concern of sacral decubitus ulcer, lack of adequate care at home, this involves an extensive number of treatment options, and is a complaint that carries with it a high risk of complications and morbidity.  The differential diagnosis includes cellulitis ulcer, abscess, anemia, electrolyte abnormality, other   Co morbidities that complicate the patient evaluation :   External quadriplegia, urinary and fecal incontinence, CHF   Additional history obtained:  Additional history obtained from EMR External records from outside source obtained and reviewed including notes, labs   Lab Tests:  I Ordered, and personally interpreted labs.  The pertinent results include: Calcium critically low at 2.0, BUN 57, creatinine 1.8, leukocytosis at 17.4, hemoglobin is 6.1, platelets 723    Cardiac Monitoring: / EKG:  The patient was maintained on a cardiac monitor.  I personally viewed and interpreted the cardiac monitored which showed an underlying rhythm of: Sinus rhythm   Consultations Obtained:  I requested consultation with the gastroenterologist Dr. Jena Gauss,  and discussed lab and imaging findings as well as pertinent plan - they recommend: PPI and blood and potassium resuscitation, hospitalist admission, discussed with hospitalist as well who is agreeable with admission.   Problem List / ED Course / Critical interventions / Medication management  Anemia-melanotic stools that are back positive, patient and daughter agreeable with blood transfusion.  Not candidate for endoscopy right now.  Given is noted to be elevated could be due to some dehydration versus upper GI bleeding.  Patient given  PPI Hypokalemia-being resuscitated, patient has chronic diarrhea and has had low potassium from this  in the past Close decubitus ulcer-surrounding erythema with no crepitus, no drainage but patient does have leukocytosis started on vancomycin  Patient also has of bacteria in the urine, she has chronic Foley initial urine was drawn off Foley port.  I asked nurse to replace the Foley catheter and do urine but urine is very cloudy and thick   To be admitted for multiple problems as above, getting blood transfusion.  Discussed with patient and her daughter that she is very ill, she does have a DNR order.  They want to pursue medical treatment, understand that she is not a candidate for endoscopy right now and discussed they may want to order palliative consult while in the hospital.  I ordered medication including potassium  for hypokalemia   I have reviewed the patients home medicines and have made adjustments as needed       Amount and/or Complexity of Data Reviewed Labs: ordered.  Risk Prescription drug management.           Final Clinical Impression(s) / ED Diagnoses Final diagnoses:  None    Rx / DC Orders ED Discharge Orders     None         Ma Rings, PA-C 12/11/22 1657    Sloan Leiter, DO 12/12/22 [phone]

## 2022-12-11 NOTE — H&P (Addendum)
History and Physical    Patient: [name] ZOX:[id number] DOB: 11-30-1918 DOA: 12/11/2022 DOS: the patient was seen and examined on 12/11/2022 PCP: Miranda Perches, MD  Patient coming from: Home  Chief Complaint:  Chief Complaint  Patient presents with   Placement   History is obtained from the daughter at bedside, via son over the phone who is the healthcare proxy, as well as from the signout from the ER.  HPI: [name] is a 89 y.o. female with medical history significant of blindness, hard of hearing, chronic disorientation, such functional weakness of all 4 extremities that she is referred to as functional quadriplegia.  She has known sacral decubitus ulcer, chronic diarrhea after bowel resection, prior pulmonary embolism.  Patient has for size sacral decubitus ulcer that has been increasingly painful over the last couple of weeks and seems to have increased in size per family.  Patient has also had black liquid diarrhea for the last 1 week.  There is no report of nosebleed or gum bleed.  Patient was evaluated by Adult Protective Services today and it was found that the patient does not have overnight care and home, and she needs 24 x 7 care.  Therefore patient was brought to the ER by Adult Protective Services.  Patient has no history of fever vomiting abdominal pain rash on skin.  ER workup shows a sacral decubitus ulcer, marked anemia, medical evaluation is sought Review of Systems: unable to review all systems due to the inability of the patient to answer questions. Past Medical History:  Diagnosis Date   DDD (degenerative disc disease), lumbar    DVT (deep venous thrombosis) (HCC)    Hip fracture (HCC)    left   Hypertension    Osteoporosis    PE (pulmonary embolism)    SBO (small bowel obstruction) (HCC) 2007   Sigmoid diverticulosis    Uterine cancer (HCC) 1993   s/p radiation therapy and hysterectomy   Past Surgical History:  Procedure Laterality Date   ABDOMINAL  ADHESION SURGERY  2007   Dr. Jamey Mcmahon   ABDOMINAL HYSTERECTOMY  1993   APPENDECTOMY  2007   BOWEL RESECTION  2007   terminal ileum (Dr. Jamey Mcmahon)   CHOLECYSTECTOMY  2006   HIP SURGERY Left 2007   Total hip replacement   Social History:  reports that she has never smoked. She has never been exposed to tobacco smoke. She has never used smokeless tobacco. She reports that she does not drink alcohol and does not use drugs.  No Known Allergies  History reviewed. No pertinent family history.  Prior to Admission medications   Medication Sig Start Date End Date Taking? Authorizing Provider  ascorbic acid (VITAMIN C) 500 MG tablet Take 1 tablet (500 mg total) by mouth daily. 11/08/22  Yes Miranda Duster, MD  calcitRIOL (ROCALTROL) 0.25 MCG capsule Take 0.25 mcg by mouth daily. 10/03/22  Yes [provider]  cyanocobalamin (VITAMIN B12) 1000 MCG/ML injection Inject 1,000 mcg into the muscle every 30 (thirty) days. 11/09/22  Yes [provider]  diltiazem (CARDIZEM CD) 240 MG 24 hr capsule Take 240 mg by mouth daily. 07/23/22  Yes [provider]  ferrous sulfate 325 (65 FE) MG tablet Take 1 tablet (325 mg total) by mouth daily with breakfast. 11/09/22  Yes Miranda Duster, MD  furosemide (LASIX) 20 MG tablet Take 20 mg by mouth daily. 08/31/22  Yes [provider]  hydrALAZINE (APRESOLINE) 25 MG tablet Take 25 mg by mouth 2 (  two) times daily. 07/27/22  Yes [provider]  Multiple Vitamin (MULTIVITAMIN WITH MINERALS) TABS tablet Take 1 tablet by mouth daily. 11/08/22  Yes Miranda Duster, MD  Nystatin (GERHARDT'S BUTT CREAM) CREA Apply 1 Application topically 3 (three) times daily as needed for irritation (apply to buttocks, groin, inner thighs, vaginal area with nystatin powder). 11/08/22  Yes Miranda Duster, MD  nystatin (MYCOSTATIN/NYSTOP) powder Apply topically 3 (three) times daily. 11/08/22  Yes Miranda Duster, MD  sodium  bicarbonate 650 MG tablet Take 2 tablets (1,300 mg total) by mouth 2 (two) times daily. 11/08/22  Yes Miranda Duster, MD  sucralfate (CARAFATE) 1 g tablet Take 1 tablet (1 g total) by mouth 4 (four) times daily -  with meals and at bedtime. 11/08/22  Yes Miranda Duster, MD  zinc sulfate 220 (50 Zn) MG capsule Take 1 capsule (220 mg total) by mouth daily. 11/08/22  Yes Miranda Duster, MD  famotidine (PEPCID) 10 MG tablet Take 1 tablet (10 mg total) by mouth daily. Patient not taking: Reported on 12/11/2022 11/09/22   Miranda Duster, MD    Physical Exam: Vitals:   12/11/22 1308 12/11/22 1600  BP:  (!) 117/41  Pulse:  68  Resp: 18 18  Temp: 98 F (36.7 C) 97.8 F (36.6 C)  TempSrc: Oral Oral  SpO2:  91%  Weight: 45.3 kg   Height: 5\' 2"  (1.575 m)    Neural: Patient was restful, as a sleeping, tries to engage in a conversation when spoken to loudly.  Patient is not oriented to location, time of day, or date.  I tried to orient the patient.  She is very hard of hearing, blind. Respiratory exam: Bilateral intravesicular vascular exam S1-S2 normal Abdomen bowel sounds are normal all quadrants are soft nontender Cardiovascular exam S1-S2 normal Extremities: No edema no skin tears are seen Neurologic: Patient demonstrate antigravity strength, although very feebly of bilateral upper extremities.  Patient does not move lower extremities to direction.  Equinus is seen. Patient has sacral decubitus ulcer: See representative photo   foley catheter in situ with marked sediment in catheter. Pending replaceent. Media Information   Document Information  Photos  Sacral wound  12/11/2022 14:14  Attached To:  Hospital Encounter on 12/11/22  Source Information  [name]  [facility]  Document History    Data Reviewed:  Labs on Admission:  Results for orders placed or performed during the hospital encounter of 12/11/22 (from the past 24 hour(s))   Urinalysis, Routine w reflex microscopic -Urine, Clean Catch     Status: Abnormal   Collection Time: 12/11/22  3:00 PM  Result Value Ref Range   Color, Urine YELLOW YELLOW   APPearance TURBID (A) CLEAR   Specific Gravity, Urine RESULTS UNAVAILABLE DUE TO INTERFERING SUBSTANCE 1.005 - 1.030   pH RESULTS UNAVAILABLE DUE TO INTERFERING SUBSTANCE 5.0 - 8.0   Glucose, UA RESULTS UNAVAILABLE DUE TO INTERFERING SUBSTANCE (A) NEGATIVE mg/dL   Hgb urine dipstick RESULTS UNAVAILABLE DUE TO INTERFERING SUBSTANCE (A) NEGATIVE   Bilirubin Urine RESULTS UNAVAILABLE DUE TO INTERFERING SUBSTANCE (A) NEGATIVE   Ketones, ur RESULTS UNAVAILABLE DUE TO INTERFERING SUBSTANCE (A) NEGATIVE mg/dL   Protein, ur RESULTS UNAVAILABLE DUE TO INTERFERING SUBSTANCE (A) NEGATIVE mg/dL   Nitrite RESULTS UNAVAILABLE DUE TO INTERFERING SUBSTANCE (A) NEGATIVE   Leukocytes,Ua RESULTS UNAVAILABLE DUE TO INTERFERING SUBSTANCE (A) NEGATIVE  Urinalysis, Microscopic (reflex)     Status: Abnormal   Collection Time: 12/11/22  3:00 PM  Result Value  Ref Range   RBC / HPF 0-5 0 - 5 RBC/hpf   WBC, UA >50 0 - 5 WBC/hpf   Bacteria, UA MANY (A) NONE SEEN   Squamous Epithelial / HPF NONE SEEN 0 - 5 /HPF  Comprehensive metabolic panel     Status: Abnormal   Collection Time: 12/11/22  3:09 PM  Result Value Ref Range   Sodium 133 (L) 135 - 145 mmol/L   Potassium 2.0 (LL) 3.5 - 5.1 mmol/L   Chloride 90 (L) 98 - 111 mmol/L   CO2 31 22 - 32 mmol/L   Glucose, Bld 98 70 - 99 mg/dL   BUN 57 (H) 8 - 23 mg/dL   Creatinine, Ser 1.61 (H) 0.44 - 1.00 mg/dL   Calcium 8.6 (L) 8.9 - 10.3 mg/dL   Total Protein 5.1 (L) 6.5 - 8.1 g/dL   Albumin 1.7 (L) 3.5 - 5.0 g/dL   AST 13 (L) 15 - 41 U/L   ALT 11 0 - 44 U/L   Alkaline Phosphatase 66 38 - 126 U/L   Total Bilirubin 0.4 0.3 - 1.2 mg/dL   GFR, Estimated 25 (L) >60 mL/min   Anion gap 12 5 - 15  CBC with Differential     Status: Abnormal   Collection Time: 12/11/22  3:09 PM  Result Value Ref  Range   WBC 17.4 (H) 4.0 - 10.5 K/uL   RBC 2.53 (L) 3.87 - 5.11 MIL/uL   Hemoglobin 6.1 (LL) 12.0 - 15.0 g/dL   HCT 09.6 (L) 04.5 - 40.9 %   MCV 75.9 (L) 80.0 - 100.0 fL   MCH 24.1 (L) 26.0 - 34.0 pg   MCHC 31.8 30.0 - 36.0 g/dL   RDW 81.1 (H) 91.4 - 78.2 %   Platelets 723 (H) 150 - 400 K/uL   nRBC 0.0 0.0 - 0.2 %   Neutrophils Relative % 87 %   Neutro Abs 15.0 (H) 1.7 - 7.7 K/uL   Lymphocytes Relative 7 %   Lymphs Abs 1.3 0.7 - 4.0 K/uL   Monocytes Relative 4 %   Monocytes Absolute 0.7 0.1 - 1.0 K/uL   Eosinophils Relative 1 %   Eosinophils Absolute 0.2 0.0 - 0.5 K/uL   Basophils Relative 0 %   Basophils Absolute 0.0 0.0 - 0.1 K/uL   Immature Granulocytes 1 %   Abs Immature Granulocytes 0.11 (H) 0.00 - 0.07 K/uL  Magnesium     Status: None   Collection Time: 12/11/22  3:09 PM  Result Value Ref Range   Magnesium 1.8 1.7 - 2.4 mg/dL  Phosphorus     Status: None   Collection Time: 12/11/22  3:09 PM  Result Value Ref Range   Phosphorus 3.0 2.5 - 4.6 mg/dL  Blood culture (routine x 2)     Status: None (Preliminary result)   Collection Time: 12/11/22  4:47 PM   Specimen: Right Antecubital; Blood  Result Value Ref Range   Specimen Description RIGHT ANTECUBITAL    Special Requests      BOTTLES DRAWN AEROBIC AND ANAEROBIC Blood Culture results may not be optimal due to an inadequate volume of blood received in culture bottles Performed at [facility], [address] [place], [place] [address]    Culture PENDING    Report Status PENDING   Blood culture (routine x 2)     Status: None (Preliminary result)   Collection Time: 12/11/22  5:26 PM   Specimen: BLOOD  Result Value Ref Range   Specimen Description  BLOOD LEFT ANTECUBITAL    Special Requests      BOTTLES DRAWN AEROBIC AND ANAEROBIC Blood Culture adequate volume Performed at [facility], [address] [place], [place] [address]    Culture PENDING    Report Status PENDING   Type and screen Ordered by PROVIDER DEFAULT      Status: None (Preliminary result)   Collection Time: 12/11/22  5:26 PM  Result Value Ref Range   ABO/RH(D) AB POS    Antibody Screen NEG    Sample Expiration 12/14/2022,2359    Unit Number [id number]    Blood Component Type RBC LR PHER2    Unit division 00    Status of Unit ISSUED    Transfusion Status OK TO TRANSFUSE    Crossmatch Result      Compatible Performed at [facility], [address] [place], [place] [address]   Prepare RBC (crossmatch)     Status: None   Collection Time: 12/11/22  5:26 PM  Result Value Ref Range   Order Confirmation      ORDER PROCESSED BY BLOOD BANK Performed at [facility], [address] [place], [place] [address]    Basic Metabolic Panel: Recent Labs  Lab 12/11/22 1509  NA 133*  K 2.0*  CL 90*  CO2 31  GLUCOSE 98  BUN 57*  CREATININE 1.80*  CALCIUM 8.6*  MG 1.8  PHOS 3.0   Liver Function Tests: Recent Labs  Lab 12/11/22 1509  AST 13*  ALT 11  ALKPHOS 66  BILITOT 0.4  PROT 5.1*  ALBUMIN 1.7*   No results for input(s): "LIPASE", "AMYLASE" in the last 168 hours. No results for input(s): "AMMONIA" in the last 168 hours. CBC: Recent Labs  Lab 12/11/22 1509  WBC 17.4*  NEUTROABS 15.0*  HGB 6.1*  HCT 19.2*  MCV 75.9*  PLT 723*   Cardiac Enzymes: No results for input(s): "CKTOTAL", "CKMB", "CKMBINDEX", "TROPONINIHS" in the last 168 hours.  BNP (last 3 results) No results for input(s): "PROBNP" in the last 8760 hours. CBG: No results for input(s): "GLUCAP" in the last 168 hours.  Radiological Exams on Admission:  No results found.  EKG: Independently reviewed. NSR   Assessment and Plan:  89 year old lady with chronic blindness, chronic functional quadriplegia and other comorbidities as above is brought in by adult protective services because there is no home care available overnight.  * GI bleeding This diagnosis is based on report of melanotic stools by family as well as finding of severe anemia.   Anemia severe enough that it warrants transfusion, to which the family has consented.  Patient is pending 1 unit PRBC transfusion.  We will target a hemoglobin of at least 7 tonight.  If not 8.  I will recheck CBC at around midnight.  I will give gentle hydration to this lady with normal saline at 75 cc an hour.  I will treat with pantoprazole.  There is no marked LFT abnormality to suggest variceal bleeding.  I will not start the patient on octreotide.  Case has been discussed with Dr. Jena Gauss of GI by ER attending who will see the patient in the morning.  Hypokalemia This is severe, I will repleted aggressively with 60 mEq IV total and 100 mEq orally.  Recheck at midnight.  Check magnesium level  Sacral wound Seems to be a pressure injury, seems to be infected with undermining, blackening.  We will treat with turning position, vancomycin and Zosyn.  Will request wound care help.  This is known since at least June 2024, where an MRI in the system shows no  osteomyelitis  Essential (primary) hypertension Patinet BP seems very sfot at this time. Hold off on anti htn agents.   Prognosis is guarded. Clear liquid diet only due to [facility].   Advance Care Planning:   Code Status: DNR DNI based on information given to me by son Mr. Ferraz  Consults: GI engaged for tomorrow morning.  Family Communication: daughter at bedside.  Severity of Illness: The appropriate patient status for this patient is INPATIENT. Inpatient status is judged to be reasonable and necessary in order to provide the required intensity of service to ensure the patient's safety. The patient's presenting symptoms, physical exam findings, and initial radiographic and laboratory data in the context of their chronic comorbidities is felt to place them at high risk for further clinical deterioration. Furthermore, it is not anticipated that the patient will be medically stable for discharge from the hospital within 2 midnights of admission.    * I certify that at the point of admission it is my clinical judgment that the patient will require inpatient hospital care spanning beyond 2 midnights from the point of admission due to high intensity of service, high risk for further deterioration and high frequency of surveillance required.*  Author: Nolberto Hanlon, MD 12/11/2022 6:48 PM  For on call review [email or web address].

## 2022-12-11 NOTE — Assessment & Plan Note (Signed)
Patinet BP seems very sfot at this time. Hold off on anti htn agents.

## 2022-12-11 NOTE — Progress Notes (Signed)
Pharmacy Antibiotic Note  [name] is a 89 y.o. female admitted on 12/11/2022 with  wound infection .  Pharmacy has been consulted for vancomycin dosing.  Plan: Vancomycin 1000 mg IV x 1 dose. Vancomycin 500 mg IV every 48 hours. Monitor labs, c/s, and vanco level as indicated.  Height: 5\' 2"  (157.5 cm) Weight: 45.3 kg (99 lb 13.9 oz) IBW/kg (Calculated) : 50.1  Temp (24hrs), Avg:98 F (36.7 C), Min:98 F (36.7 C), Max:98 F (36.7 C)  Recent Labs  Lab 12/11/22 1509  WBC 17.4*  CREATININE 1.80*    Estimated Creatinine Clearance: 11.3 mL/min (A) (by C-G formula based on SCr of 1.8 mg/dL (H)).    No Known Allergies  Antimicrobials this admission: Vanco 8/14 >> CTX 8/14  Microbiology results: 8/14 BCx: pending 8/14 UCx: pending    Thank you for allowing pharmacy to be a part of this patient's care.  Judeth Cornfield, PharmD Clinical Pharmacist 12/11/2022 4:17 PM

## 2022-12-11 NOTE — Assessment & Plan Note (Addendum)
Seems to be a pressure injury, seems to be infected with undermining, blackening.  We will treat with turning position, vancomycin and Zosyn.  Will request wound care help.  This is known since at least June 2024, where an MRI in the system shows no  osteomyelitis

## 2022-12-11 NOTE — Assessment & Plan Note (Signed)
This is severe, I will repleted aggressively with 60 mEq IV total and 100 mEq orally.  Recheck at midnight.  Check magnesium level

## 2022-12-11 NOTE — ED Notes (Signed)
Unable to obtain 2 blood cultures PA notified. One culture sent to lab at this time.

## 2022-12-12 ENCOUNTER — Encounter (HOSPITAL_COMMUNITY): Payer: Self-pay | Admitting: Internal Medicine

## 2022-12-12 DIAGNOSIS — S31000A Unspecified open wound of lower back and pelvis without penetration into retroperitoneum, initial encounter: Secondary | ICD-10-CM

## 2022-12-12 DIAGNOSIS — Z7189 Other specified counseling: Secondary | ICD-10-CM

## 2022-12-12 DIAGNOSIS — Z515 Encounter for palliative care: Secondary | ICD-10-CM | POA: Diagnosis not present

## 2022-12-12 DIAGNOSIS — I10 Essential (primary) hypertension: Secondary | ICD-10-CM | POA: Diagnosis not present

## 2022-12-12 DIAGNOSIS — E876 Hypokalemia: Secondary | ICD-10-CM

## 2022-12-12 DIAGNOSIS — K921 Melena: Secondary | ICD-10-CM | POA: Diagnosis not present

## 2022-12-12 LAB — BASIC METABOLIC PANEL
Anion gap: 9 (ref 5–15)
BUN: 50 mg/dL — ABNORMAL HIGH (ref 8–23)
CO2: 28 mmol/L (ref 22–32)
Calcium: 7.9 mg/dL — ABNORMAL LOW (ref 8.9–10.3)
Chloride: 95 mmol/L — ABNORMAL LOW (ref 98–111)
Creatinine, Ser: 1.63 mg/dL — ABNORMAL HIGH (ref 0.44–1.00)
GFR, Estimated: 28 mL/min — ABNORMAL LOW (ref >60)
Glucose, Bld: 80 mg/dL (ref 70–99)
Potassium: 3.3 mmol/L — ABNORMAL LOW (ref 3.5–5.1)
Sodium: 132 mmol/L — ABNORMAL LOW (ref 135–145)

## 2022-12-12 LAB — CBC
HCT: 22.6 % — ABNORMAL LOW (ref 36.0–46.0)
Hemoglobin: 7.3 g/dL — ABNORMAL LOW (ref 12.0–15.0)
MCH: 26 pg (ref 26.0–34.0)
MCHC: 32.3 g/dL (ref 30.0–36.0)
MCV: 80.4 fL (ref 80.0–100.0)
Platelets: 649 10*3/uL — ABNORMAL HIGH (ref 150–400)
RBC: 2.81 MIL/uL — ABNORMAL LOW (ref 3.87–5.11)
RDW: 17.8 % — ABNORMAL HIGH (ref 11.5–15.5)
WBC: 21.1 10*3/uL — ABNORMAL HIGH (ref 4.0–10.5)
nRBC: 0 % (ref 0.0–0.2)

## 2022-12-12 LAB — MRSA NEXT GEN BY PCR, NASAL: MRSA by PCR Next Gen: NOT DETECTED

## 2022-12-12 LAB — PROTIME-INR
INR: 1.4 — ABNORMAL HIGH (ref 0.8–1.2)
Prothrombin Time: 17.6 s — ABNORMAL HIGH (ref 11.4–15.2)

## 2022-12-12 LAB — MAGNESIUM: Magnesium: 1.7 mg/dL (ref 1.7–2.4)

## 2022-12-12 LAB — GLUCOSE, CAPILLARY
Glucose-Capillary: 70 mg/dL (ref 70–99)
Glucose-Capillary: 100 mg/dL — ABNORMAL HIGH (ref 70–99)

## 2022-12-12 LAB — APTT: aPTT: 50 s — ABNORMAL HIGH (ref 24–36)

## 2022-12-12 MED ORDER — POTASSIUM CHLORIDE 10 MEQ/100ML IV SOLN
10.0000 meq | INTRAVENOUS | Status: AC
  Administered 2022-12-12 (×2): 10 meq via INTRAVENOUS
  Filled 2022-12-12 (×2): qty 100

## 2022-12-12 MED ORDER — DILTIAZEM HCL ER COATED BEADS 240 MG PO CP24
240.0000 mg | ORAL_CAPSULE | Freq: Every day | ORAL | Status: DC
  Administered 2022-12-12 – 2022-12-15 (×3): 240 mg via ORAL
  Filled 2022-12-12 (×4): qty 1

## 2022-12-12 MED ORDER — VITAMIN C 500 MG PO TABS
500.0000 mg | ORAL_TABLET | Freq: Every day | ORAL | Status: DC
  Administered 2022-12-12 – 2022-12-15 (×4): 500 mg via ORAL
  Filled 2022-12-12 (×6): qty 1

## 2022-12-12 MED ORDER — FERROUS SULFATE 325 (65 FE) MG PO TABS
325.0000 mg | ORAL_TABLET | Freq: Every day | ORAL | Status: DC
  Administered 2022-12-13 – 2022-12-15 (×3): 325 mg via ORAL
  Filled 2022-12-12 (×4): qty 1

## 2022-12-12 MED ORDER — METRONIDAZOLE 500 MG/100ML IV SOLN
500.0000 mg | Freq: Two times a day (BID) | INTRAVENOUS | Status: DC
  Administered 2022-12-12 – 2022-12-14 (×5): 500 mg via INTRAVENOUS
  Filled 2022-12-12 (×5): qty 100

## 2022-12-12 MED ORDER — GERHARDT'S BUTT CREAM
1.0000 | TOPICAL_CREAM | Freq: Three times a day (TID) | CUTANEOUS | Status: DC | PRN
  Administered 2022-12-13: 1 via TOPICAL
  Filled 2022-12-12: qty 1

## 2022-12-12 MED ORDER — ADULT MULTIVITAMIN W/MINERALS CH
1.0000 | ORAL_TABLET | Freq: Every day | ORAL | Status: DC
  Administered 2022-12-12 – 2022-12-15 (×4): 1 via ORAL
  Filled 2022-12-12 (×5): qty 1

## 2022-12-12 MED ORDER — SODIUM CHLORIDE 0.9 % IV SOLN
2.0000 g | INTRAVENOUS | Status: DC
  Administered 2022-12-12 – 2022-12-13 (×2): 2 g via INTRAVENOUS
  Filled 2022-12-12 (×2): qty 20

## 2022-12-12 MED ORDER — MEDIHONEY WOUND/BURN DRESSING EX PSTE
1.0000 | PASTE | Freq: Every day | CUTANEOUS | Status: DC
  Administered 2022-12-12 – 2022-12-15 (×3): 1 via TOPICAL
  Filled 2022-12-12: qty 44

## 2022-12-12 MED ORDER — LINEZOLID 600 MG/300ML IV SOLN
600.0000 mg | Freq: Two times a day (BID) | INTRAVENOUS | Status: DC
  Administered 2022-12-12 – 2022-12-14 (×4): 600 mg via INTRAVENOUS
  Filled 2022-12-12 (×6): qty 300

## 2022-12-12 MED ORDER — SUCRALFATE 1 G PO TABS
1.0000 g | ORAL_TABLET | Freq: Three times a day (TID) | ORAL | Status: DC
  Administered 2022-12-12 – 2022-12-15 (×8): 1 g via ORAL
  Filled 2022-12-12 (×11): qty 1

## 2022-12-12 MED ORDER — HYDRALAZINE HCL 25 MG PO TABS
25.0000 mg | ORAL_TABLET | Freq: Two times a day (BID) | ORAL | Status: DC
  Administered 2022-12-12 – 2022-12-15 (×6): 25 mg via ORAL
  Filled 2022-12-12 (×7): qty 1

## 2022-12-12 NOTE — Consult Note (Signed)
Consultation Note Date: 12/12/2022   Patient Name: [name]  DOB: 1918/07/23  MRN: [id number]  Age / Sex: 89 y.o., female  PCP: Carylon Perches, MD Referring Physician: Cleora Fleet, MD  Reason for Consultation: Establishing goals of care  HPI/Patient Profile: 89 y.o. female  with past medical history of functional quadriplegia with known sacral decubitus, chronic diarrhea after bowel resection, prior pulmonary embolism, blind and hard of hearing, HTN, osteoporosis, history of uterine cancer with hysterectomy and radiation therapy in 1993, history of left hip fracture, DVT, degenerative disc disease lumbar admitted on 12/11/2022 with GI bleed with melena stool and severe hypokalemia.   Clinical Assessment and Goals of Care: I have reviewed medical records including EPIC notes, labs and imaging, received report from RN, assessed the patient.  [name] is lying quietly in bed.  She appears chronically ill and quite frail.  She is resting comfortably therefore I do not attempt to wake her.  Nursing staff shares that she has become more alert as the day progresses.  Her son/HCPOA, [name] and his wife [name] are present along with daughter [name].   We meet at the bedside to discuss diagnosis prognosis, GOC, EOL wishes, disposition and options.  I introduced Palliative Medicine as specialized medical care for people living with serious illness. It focuses on providing relief from the symptoms and stress of a serious illness. The goal is to improve quality of life for both the patient and the family.  We focused on their current illness.  We talk about [name] low potassium.  Family states that she had this in the past while she was hospitalized.  I shared that if they had not brought her in to the hospital with this extra support and treatment she would have passed already.  We also talk about her wounds.   Daughter [name] shares that her wounds had healed in the past but son [name] disagrees.  He states that it is so difficult to have wound healing and wound care because of her chronic loose stool.  We talk about her chronic illness burden and her suffering.  The natural disease trajectory and expectations at EOL were discussed.  Advanced directives, concepts specific to code status, artifical feeding and hydration, and rehospitalization were considered and discussed.  DNR verified.  Hospice and Palliative Care services outpatient were explained and offered.  [name] would easily qualify for residential hospice with an albumin of 1.7 and frailty.  [name] shares that he independently reached out to [facility] [place] for services.  He shares that a worker came to visit with them and at this point he chose palliative medicine.  Their first visit was to be 29 August.  We talk about the benefits of hospice care including treat the treatable care and comfort care.  We talk about meaningful improvement.  I shared that [name] may benefit from hospice care if no improvement in the next day or so.  Discussed the importance of continued conversation with family and the  medical providers regarding overall plan of care and treatment options, ensuring decisions are within the context of the patient's values and GOCs.  Questions and concerns were addressed. The family was encouraged to call with questions or concerns.  PMT will continue to support holistically.  Conference with attending, bedside nursing staff, transition of care team related to patient condition, needs, goals of care, disposition.    HCPOA  HCPOA - [name], son. Also has daughter, [name].     SUMMARY OF RECOMMENDATIONS   At this point continue to treat the treatable but no CPR or intubation Time for outcomes Would benefit from hospice care   Code Status/Advance Care Planning: DNR  Symptom Management:  Per  hospitalist, no additional needs at this time.  Palliative Prophylaxis:  Frequent Pain Assessment, Oral Care, Palliative Wound Care, and Turn Reposition  Additional Recommendations (Limitations, Scope, Preferences): Continue to treat but no CPR or intubation  Psycho-social/Spiritual:  Desire for further Chaplaincy support:no Additional Recommendations: Caregiving  Support/Resources and Education on Hospice  Prognosis:  Unable to determine, guarded at this point.  Days would not be surprising if she is not unable to have recovery  Discharge Planning: To Be Determined      Primary Diagnoses: Present on Admission:  GI bleeding  Sacral wound  Hypokalemia  Essential (primary) hypertension   I have reviewed the medical record, interviewed the patient and family, and examined the patient. The following aspects are pertinent.  Past Medical History:  Diagnosis Date   DDD (degenerative disc disease), lumbar    DVT (deep venous thrombosis) (HCC)    Hip fracture (HCC)    left   Hypertension    Osteoporosis    PE (pulmonary embolism)    SBO (small bowel obstruction) (HCC) 2007   Sigmoid diverticulosis    Uterine cancer (HCC) 1993   s/p radiation therapy and hysterectomy   Social History   Socioeconomic History   Marital status: Widowed    Spouse name: Not on file   Number of children: 2   Years of education: 68   Highest education level: 12th grade  Occupational History   Not on file  Tobacco Use   Smoking status: Never    Passive exposure: Never   Smokeless tobacco: Never  Vaping Use   Vaping status: Never Used  Substance and Sexual Activity   Alcohol use: No   Drug use: No   Sexual activity: Not Currently    Partners: Male  Other Topics Concern   Not on file  Social History Narrative   Not on file   Social Determinants of Health   Financial Resource Strain: Low Risk  (09/17/2022)   Overall Financial Resource Strain (CARDIA)    Difficulty of Paying Living  Expenses: Not hard at all  Food Insecurity: No Food Insecurity (11/02/2022)   Hunger Vital Sign    Worried About Running Out of Food in the Last Year: Never true    Ran Out of Food in the Last Year: Never true  Transportation Needs: No Transportation Needs (11/02/2022)   PRAPARE - Administrator, Civil Service (Medical): No    Lack of Transportation (Non-Medical): No  Physical Activity: Insufficiently Active (09/17/2022)   Exercise Vital Sign    Days of Exercise per Week: 3 days    Minutes of Exercise per Session: 30 min  Stress: No Stress Concern Present (09/17/2022)   [employer] of Occupational Health - Occupational Stress Questionnaire    Feeling of Stress :  Not at all  Social Connections: Moderately Integrated (09/17/2022)   Social Connection and Isolation Panel [NHANES]    Frequency of Communication with Friends and Family: More than three times a week    Frequency of Social Gatherings with Friends and Family: More than three times a week    Attends Religious Services: More than 4 times per year    Active Member of [employer] or Organizations: Yes    Attends Banker Meetings: More than 4 times per year    Marital Status: Widowed   History reviewed. No pertinent family history. Scheduled Meds:  sodium chloride   Intravenous Once   ascorbic acid  500 mg Oral Daily   calcitRIOL  0.25 mcg Oral Daily   Chlorhexidine Gluconate Cloth  6 each Topical Daily   diltiazem  240 mg Oral Daily   [START ON 12/13/2022] ferrous sulfate  325 mg Oral Q breakfast   hydrALAZINE  25 mg Oral BID   leptospermum manuka honey  1 Application Topical Daily   multivitamin with minerals  1 tablet Oral Daily   nystatin   Topical TID   pantoprazole (PROTONIX) IV  40 mg Intravenous Q12H   sodium chloride flush  3 mL Intravenous Q12H   sucralfate  1 g Oral TID WC & HS   zinc sulfate  220 mg Oral Daily   Continuous Infusions:  sodium chloride Stopped (12/12/22 1418)   cefTRIAXone  (ROCEPHIN)  IV 200 mL/hr at 12/12/22 1432   linezolid (ZYVOX) IV     metronidazole Stopped (12/12/22 1011)   PRN Meds:.acetaminophen **OR** acetaminophen, Gerhardt's butt cream, HYDROmorphone (DILAUDID) injection, polyethylene glycol Medications Prior to Admission:  Prior to Admission medications   Medication Sig Start Date End Date Taking? Authorizing Provider  ascorbic acid (VITAMIN C) 500 MG tablet Take 1 tablet (500 mg total) by mouth daily. 11/08/22  Yes Marcelino Duster, MD  calcitRIOL (ROCALTROL) 0.25 MCG capsule Take 0.25 mcg by mouth daily. 10/03/22  Yes [provider]  cyanocobalamin (VITAMIN B12) 1000 MCG/ML injection Inject 1,000 mcg into the muscle every 30 (thirty) days. 11/09/22  Yes [provider]  diltiazem (CARDIZEM CD) 240 MG 24 hr capsule Take 240 mg by mouth daily. 07/23/22  Yes [provider]  ferrous sulfate 325 (65 FE) MG tablet Take 1 tablet (325 mg total) by mouth daily with breakfast. 11/09/22  Yes Marcelino Duster, MD  furosemide (LASIX) 20 MG tablet Take 20 mg by mouth daily. 08/31/22  Yes [provider]  hydrALAZINE (APRESOLINE) 25 MG tablet Take 25 mg by mouth 2 (two) times daily. 07/27/22  Yes [provider]  Multiple Vitamin (MULTIVITAMIN WITH MINERALS) TABS tablet Take 1 tablet by mouth daily. 11/08/22  Yes Marcelino Duster, MD  Nystatin (GERHARDT'S BUTT CREAM) CREA Apply 1 Application topically 3 (three) times daily as needed for irritation (apply to buttocks, groin, inner thighs, vaginal area with nystatin powder). 11/08/22  Yes Marcelino Duster, MD  nystatin (MYCOSTATIN/NYSTOP) powder Apply topically 3 (three) times daily. 11/08/22  Yes Marcelino Duster, MD  sodium bicarbonate 650 MG tablet Take 2 tablets (1,300 mg total) by mouth 2 (two) times daily. 11/08/22  Yes Marcelino Duster, MD  sucralfate (CARAFATE) 1 g tablet Take 1 tablet (1 g total) by mouth 4 (four) times daily -  with meals and at  bedtime. 11/08/22  Yes Marcelino Duster, MD  zinc sulfate 220 (50 Zn) MG capsule Take 1 capsule (220 mg total) by mouth daily. 11/08/22  Yes Marcelino Duster, MD  famotidine (PEPCID) 10 MG tablet Take 1 tablet (10 mg total) by mouth daily. Patient not taking: Reported on 12/11/2022 11/09/22   Marcelino Duster, MD   No Known Allergies Review of Systems  Unable to perform ROS: Age    Physical Exam Vitals and nursing note reviewed.     Vital Signs: BP (!) 161/39   Pulse 90   Temp (!) 97.4 F (36.3 C) (Oral)   Resp 16   Ht 5\' 2"  (1.575 m)   Wt 45.3 kg   SpO2 97%   BMI 18.27 kg/m  Pain Scale: Faces   Pain Score: Asleep   SpO2: SpO2: 97 % O2 Device:SpO2: 97 % O2 Flow Rate: .   IO: Intake/output summary:  Intake/Output Summary (Last 24 hours) at 12/12/2022 1608 Last data filed at 12/12/2022 1432 Gross per 24 hour  Intake 2331.98 ml  Output 375 ml  Net 1956.98 ml    LBM:   Baseline Weight: Weight: 45.3 kg Most recent weight: Weight: 45.3 kg     Palliative Assessment/Data:     Time In: 1430 Time Out: 1545 Time Total: 75 minutes  Greater than 50%  of this time was spent counseling and coordinating care related to the above assessment and plan.  Signed by: Katheran Awe, NP   Please contact Palliative Medicine Team phone at [phone] for questions and concerns.  For individual provider: See Loretha Stapler

## 2022-12-12 NOTE — Progress Notes (Signed)
PROGRESS NOTE   [name]  YQM:[id number] DOB: [birth year] DOA: 12/11/2022 PCP: Carylon Perches, MD   Chief Complaint  Patient presents with   Placement   Level of care: Med-Surg  Brief Admission History:  89 y.o. female with medical history significant of blindness, hard of hearing, chronic disorientation, such functional weakness of all 4 extremities that she is referred to as functional quadriplegia.  She has known sacral decubitus ulcer, chronic diarrhea after bowel resection, prior pulmonary embolism.   Patient has a sacral decubitus ulcer that has been increasingly painful over the last couple of weeks and seems to have increased in size per family.  Patient has also had black liquid diarrhea for the last 1 week.  There is no report of nosebleed or gum bleed.  Patient was evaluated by Adult Protective Services and it was found that the patient does not have overnight care and home, and she needs 24 x 7 care.  Therefore patient was brought to the ER by Adult Protective Services.   Patient has no history of fever vomiting abdominal pain rash on skin.  ER workup shows a progressive sacral decubitus ulcer, marked anemia, medical evaluation was requested.    Assessment and Plan:  GI bleeding/Melanotic stool Pt also with significant iron deficiency anemia Pt not a candidate for invasive procedures or diagnostics given advanced age and frailty Try to maintain Hg>7  Continue IV pantoprazole as ordered  Await palliative discussion regarding further transfusion for Hg<7   Hypokalemia (severe) This is being repleted IV Recheck in AM after treatments   Unstageable chronic Sacral wound Seems to be a pressure injury, seems to be infected with undermining, blackening.  We will treat with turning position, vancomycin and Zosyn.  Will request wound care help.  This is known since at least June 2024, where an MRI in the system shows no osteomyelitis IV hydromorphone ordered for pain management    Essential (primary) hypertension Initially  held BP meds due to hypotension BP has rebounded after IV fluids Family requesting to resume home meds   Adult Failure to Thrive Consult to [facility] regarding APS findings and recommendations Palliative medicine consultation for goals of care discussions  DNR present on admission Continue DNR order while in hospital   DVT prophylaxis: SCDs Code Status: DNR  Family Communication: palliative discussion Disposition: TBD   Consultants:  TOC Palliative care  Procedures:   Antimicrobials:  Linezolid Metronidazole   Subjective: Pt totally unresponsive this morning on exam; nonverbal; barely responsive to noxious stimulation  Objective: Vitals:   12/12/22 1300 12/12/22 1330 12/12/22 1400 12/12/22 1430  BP: (!) 131/36 (!) 171/57 (!) 162/34 (!) 161/39  Pulse: 80 99 86 90  Resp: 14 11 14 16   Temp:      TempSrc:      SpO2: 94% 94% 93% 97%  Weight:      Height:        Intake/Output Summary (Last 24 hours) at 12/12/2022 1446 Last data filed at 12/12/2022 1432 Gross per 24 hour  Intake 2331.98 ml  Output 375 ml  Net 1956.98 ml   Filed Weights   12/11/22 1308  Weight: 45.3 kg   Examination:  General exam: frail, elderly, pale female lying supine in bed; appears terminally ill;  Respiratory system: shallow breathing.  Cardiovascular system: normal S1 & S2 heard.  Gastrointestinal system: Abdomen is nondistended, soft and nontender. No organomegaly or masses felt. Normal bowel sounds heard. Central nervous system: Alert and oriented. No focal neurological deficits.  Extremities: Symmetric 5 x 5 power. Skin: unstageable sacral decubitus ulcer  Psychiatry: unable to determine   Data Reviewed: I have personally reviewed following labs and imaging studies  CBC: Recent Labs  Lab 12/11/22 1509 12/11/22 2254 12/12/22 0515  WBC 17.4* 20.3* 21.1*  NEUTROABS 15.0*  --   --   HGB 6.1* 7.3* 7.3*  HCT 19.2* 21.9* 22.6*  MCV 75.9*  79.1* 80.4  PLT 723* 609* 649*    Basic Metabolic Panel: Recent Labs  Lab 12/11/22 1509 12/12/22 0515  NA 133* 132*  K 2.0* 3.3*  CL 90* 95*  CO2 31 28  GLUCOSE 98 80  BUN 57* 50*  CREATININE 1.80* 1.63*  CALCIUM 8.6* 7.9*  MG 1.8 1.7  PHOS 3.0  --     CBG: Recent Labs  Lab 12/12/22 1144  GLUCAP 70    Recent Results (from the past 240 hour(s))  Blood culture (routine x 2)     Status: None (Preliminary result)   Collection Time: 12/11/22  4:47 PM   Specimen: Right Antecubital; Blood  Result Value Ref Range Status   Specimen Description RIGHT ANTECUBITAL  Final   Special Requests   Final    BOTTLES DRAWN AEROBIC AND ANAEROBIC Blood Culture results may not be optimal due to an inadequate volume of blood received in culture bottles   Culture   Final    NO GROWTH < 24 HOURS Performed at [facility], [address] [place], [place] [address]    Report Status PENDING  Incomplete  Blood culture (routine x 2)     Status: None (Preliminary result)   Collection Time: 12/11/22  5:26 PM   Specimen: BLOOD  Result Value Ref Range Status   Specimen Description BLOOD LEFT ANTECUBITAL  Final   Special Requests   Final    BOTTLES DRAWN AEROBIC AND ANAEROBIC Blood Culture adequate volume   Culture   Final    NO GROWTH < 24 HOURS Performed at [facility], [address] [place], [place] [address]    Report Status PENDING  Incomplete  MRSA Next Gen by PCR, Nasal     Status: None   Collection Time: 12/11/22  9:43 PM   Specimen: Nasal Mucosa; Nasal Swab  Result Value Ref Range Status   MRSA by PCR Next Gen NOT DETECTED NOT DETECTED Final    Comment: (NOTE) The GeneXpert MRSA Assay (FDA approved for NASAL specimens only), is one component of a comprehensive MRSA colonization surveillance program. It is not intended to diagnose MRSA infection nor to guide or monitor treatment for MRSA infections. Test performance is not FDA approved in patients less than 89  years old. Performed at [facility], [address], [place], [place] [address]      Radiology Studies: No results found.  Scheduled Meds:  sodium chloride   Intravenous Once   ascorbic acid  500 mg Oral Daily   calcitRIOL  0.25 mcg Oral Daily   Chlorhexidine Gluconate Cloth  6 each Topical Daily   diltiazem  240 mg Oral Daily   [START ON 12/13/2022] ferrous sulfate  325 mg Oral Q breakfast   hydrALAZINE  25 mg Oral BID   leptospermum manuka honey  1 Application Topical Daily   multivitamin with minerals  1 tablet Oral Daily   nystatin   Topical TID   pantoprazole (PROTONIX) IV  40 mg Intravenous Q12H   sodium chloride flush  3 mL Intravenous Q12H   sucralfate  1 g Oral TID  WC & HS   zinc sulfate  220 mg Oral Daily   Continuous Infusions:  sodium chloride Stopped (12/12/22 1418)   cefTRIAXone (ROCEPHIN)  IV 200 mL/hr at 12/12/22 1432   linezolid (ZYVOX) IV     metronidazole Stopped (12/12/22 1011)     LOS: 1 day   Time spent: 38 mins   Laural Benes, MD How to contact the [facility] Attending or Consulting provider 7A - 7P or covering provider during after hours 7P -7A, for this patient?  Check the care team in [facility] and look for a) attending/consulting [facility] provider listed and b) the [facility] team listed Log into [email or web address] and use [place]'s universal password to access. If you do not have the password, please contact the hospital operator. Locate the [facility] provider you are looking for under [facility] and page to a number that you can be directly reached. If you still have difficulty reaching the provider, please page the [facility] (Director on Call) for the Hospitalists listed on amion for assistance.  12/12/2022, 2:46 PM

## 2022-12-12 NOTE — Progress Notes (Signed)
Blood sugar 70. Patient on clear liquid diet. Would only take 3 sips of orange juice. Dr. Laural Benes made aware.

## 2022-12-12 NOTE — Consult Note (Signed)
WOC Nurse Consult Note: Patient is admitted from home after conflicting reports of availability of 24/7 care and decline in patient's health.  Stage 3 pressure injury to left sacrum was present on admission and  noted on 11/02/22.  Surgery debrided the wound at the beside.  Wound has declined with additional open areas noted.  Photo in chart.  Reason for Consult: unstageable pressure injuries to left sacrum and left trochanter Wound type: pressure and moisture from prolonged exposure to loose stool and melena.   Pressure Injury POA: Yes Measurement: bedside RN to obtain and document to flowsheet. Request sent to Eulis Foster, RN and the assistance is appreciated.   Wound bed: Left sacral area:  25% slough, 75% ruddy red, friable tissue. Separated edges Left trochanter (above sacral wound) :  100% necrotic tissue with surrounding erythema between the two areas.  Is on Rocephin IV.  Drainage (amount, consistency, odor)  serosanguinous effluent noted on dressing.  Periwound:  Erythema- significant decline since 11/01/22 wounds Dressing procedure/placement/frequency: Cleanse wounds to sacrum and left trochanter with NS and pat dry.  Apply Medihoney to wound bed.  Cover with dry gauze and foam dressing.  Replace medihoney and gauze daily.  Replace foam every three days and PRN soilage.  PRevalon boots  Low air loss mattress (in place in ICU- will need to be ordered if transferred to medical unit)  Will not follow at this time.  Please re-consult if needed.  Mike Gip MSN, RN, FNP-BC CWON Wound, Ostomy, Continence Nurse Outpatient [facility] [phone] Pager [phone]

## 2022-12-12 NOTE — Plan of Care (Signed)
  Problem: Education: Goal: Knowledge of General Education information will improve Description: Including pain rating scale, medication(s)/side effects and non-pharmacologic comfort measures Outcome: Progressing   Problem: Coping: Goal: Level of anxiety will decrease Outcome: Progressing   Problem: Elimination: Goal: Will not experience complications related to bowel motility Outcome: Progressing Goal: Will not experience complications related to urinary retention Outcome: Progressing   Problem: Pain Managment: Goal: General experience of comfort will improve Outcome: Progressing   Problem: Skin Integrity: Goal: Risk for impaired skin integrity will decrease Outcome: Progressing   Problem: Nutrition: Goal: Adequate nutrition will be maintained Outcome: Not Progressing

## 2022-12-12 NOTE — Progress Notes (Addendum)
Daughter, June, tearful and at bedside concerned that her mother isn't waking up and responding to voice. Education provided and a sternal rub assessment was shown to daughter that patient has a pain response. Daughter very upset with sternal rub and stated not to do that again due to her mother being fragile.
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
## 2022-12-13 DIAGNOSIS — K921 Melena: Secondary | ICD-10-CM | POA: Diagnosis not present

## 2022-12-13 DIAGNOSIS — S31000A Unspecified open wound of lower back and pelvis without penetration into retroperitoneum, initial encounter: Secondary | ICD-10-CM | POA: Diagnosis not present

## 2022-12-13 DIAGNOSIS — I10 Essential (primary) hypertension: Secondary | ICD-10-CM | POA: Diagnosis not present

## 2022-12-13 DIAGNOSIS — Z515 Encounter for palliative care: Secondary | ICD-10-CM | POA: Diagnosis not present

## 2022-12-13 DIAGNOSIS — Z7189 Other specified counseling: Secondary | ICD-10-CM | POA: Diagnosis not present

## 2022-12-13 LAB — BASIC METABOLIC PANEL
Anion gap: 6 (ref 5–15)
BUN: 43 mg/dL — ABNORMAL HIGH (ref 8–23)
CO2: 28 mmol/L (ref 22–32)
Calcium: 7.6 mg/dL — ABNORMAL LOW (ref 8.9–10.3)
Chloride: 98 mmol/L (ref 98–111)
Creatinine, Ser: 1.52 mg/dL — ABNORMAL HIGH (ref 0.44–1.00)
GFR, Estimated: 30 mL/min — ABNORMAL LOW (ref >60)
Glucose, Bld: 101 mg/dL — ABNORMAL HIGH (ref 70–99)
Potassium: 2.7 mmol/L — CL (ref 3.5–5.1)
Sodium: 132 mmol/L — ABNORMAL LOW (ref 135–145)

## 2022-12-13 LAB — DIFFERENTIAL
Abs Immature Granulocytes: 0.15 10*3/uL — ABNORMAL HIGH (ref 0.00–0.07)
Basophils Absolute: 0 10*3/uL (ref 0.0–0.1)
Basophils Relative: 0 %
Eosinophils Absolute: 0 10*3/uL (ref 0.0–0.5)
Eosinophils Relative: 0 %
Immature Granulocytes: 1 %
Lymphocytes Relative: 9 %
Lymphs Abs: 1.6 10*3/uL (ref 0.7–4.0)
Monocytes Absolute: 0.8 10*3/uL (ref 0.1–1.0)
Monocytes Relative: 4 %
Neutro Abs: 15.6 10*3/uL — ABNORMAL HIGH (ref 1.7–7.7)
Neutrophils Relative %: 86 %

## 2022-12-13 LAB — URINE CULTURE

## 2022-12-13 LAB — CBC
HCT: 21.4 % — ABNORMAL LOW (ref 36.0–46.0)
Hemoglobin: 7.1 g/dL — ABNORMAL LOW (ref 12.0–15.0)
MCH: 26.7 pg (ref 26.0–34.0)
MCHC: 33.2 g/dL (ref 30.0–36.0)
MCV: 80.5 fL (ref 80.0–100.0)
Platelets: 675 10*3/uL — ABNORMAL HIGH (ref 150–400)
RBC: 2.66 MIL/uL — ABNORMAL LOW (ref 3.87–5.11)
RDW: 18.2 % — ABNORMAL HIGH (ref 11.5–15.5)
WBC: 18.3 10*3/uL — ABNORMAL HIGH (ref 4.0–10.5)
nRBC: 0 % (ref 0.0–0.2)

## 2022-12-13 LAB — MAGNESIUM: Magnesium: 1.6 mg/dL — ABNORMAL LOW (ref 1.7–2.4)

## 2022-12-13 MED ORDER — MAGNESIUM SULFATE 2 GM/50ML IV SOLN
2.0000 g | Freq: Once | INTRAVENOUS | Status: AC
  Administered 2022-12-13: 2 g via INTRAVENOUS
  Filled 2022-12-13: qty 50

## 2022-12-13 MED ORDER — POTASSIUM CHLORIDE 10 MEQ/100ML IV SOLN
10.0000 meq | INTRAVENOUS | Status: AC
  Administered 2022-12-13 (×3): 10 meq via INTRAVENOUS
  Filled 2022-12-13 (×3): qty 100

## 2022-12-13 NOTE — NC FL2 (Signed)
[place] MEDICAID FL2 LEVEL OF CARE FORM     IDENTIFICATION  Patient Name: [name] Birthdate: Sep 13, 1918 Sex: female Admission Date (Current Location): 12/11/2022  [facility] and [place] Number:  Aaron Edelman per son, patient was approved for Medicaid on Tuesday Facility and Address:         Provider Number: [id number]  Attending Physician Name and Address:  Arnetha Courser, MD  Relative Name and Phone Number:  [place], Riesgo)  [phone]    Current Level of Care: Hospital Recommended Level of Care: Skilled Nursing Facility Prior Approval Number:    Date Approved/Denied:   PASRR Number: [id number]  Discharge Plan: SNF    Current Diagnoses: Patient Active Problem List   Diagnosis Date Noted   GI bleeding 12/11/2022   Protein-calorie malnutrition, severe 11/08/2022   Functional quadriplegia (HCC) 11/05/2022   Iron deficiency anemia 11/03/2022   Hypokalemia 11/02/2022   Hyponatremia 11/02/2022   Acute kidney injury superimposed on CKD (HCC) 11/02/2022   Abnormal blood electrolyte level 11/02/2022   Decubitus ulcer of buttock, stage 3 (HCC) 11/02/2022   Hypomagnesemia 11/02/2022   C. difficile colitis 10/24/2022   Sacral wound 10/23/2022   Generalized weakness 10/23/2022   CKD (chronic kidney disease) stage 4, GFR 15-29 ml/min (HCC) 10/21/2022   Chronic diarrhea 10/21/2022   Right rotator cuff tear 01/04/2013   Pain in joint, shoulder region 01/04/2013   Muscle weakness (generalized) 01/04/2013   Abdominal pain 12/14/2012   Blood in the urine 12/14/2012   Buzzing in ear 12/14/2012   Calculus of kidney 12/14/2012   Essential (primary) hypertension 12/14/2012   PNA (pneumonia) 12/14/2012    Orientation RESPIRATION BLADDER Height & Weight     Self  Normal Incontinent Weight: 99 lb 13.9 oz (45.3 kg) Height:  5\' 2"  (157.5 cm)  BEHAVIORAL SYMPTOMS/MOOD NEUROLOGICAL BOWEL NUTRITION STATUS      Incontinent Diet (see d/c summary)  AMBULATORY STATUS  COMMUNICATION OF NEEDS Skin   Extensive Assist Verbally PU Stage and Appropriate Care (8/14: buttock, unstagable; 6/24 sacrum, left, upper, unstagable)                       Personal Care Assistance Level of Assistance  Bathing, Feeding, Dressing Bathing Assistance: Maximum assistance Feeding assistance: Maximum assistance Dressing Assistance: Maximum assistance     Functional Limitations Info  Sight, Hearing, Speech Sight Info: Adequate Hearing Info: Adequate Speech Info: Adequate    SPECIAL CARE FACTORS FREQUENCY                       Contractures Contractures Info: Not present    Additional Factors Info  Code Status, Allergies Code Status Info: DNR Allergies Info: NKA           Current Medications (12/13/2022):  This is the current hospital active medication list Current Facility-Administered Medications  Medication Dose Route Frequency Provider Last Rate Last Admin   0.9 %  sodium chloride infusion (Manually program via Guardrails IV Fluids)   Intravenous Once Nolberto Hanlon, MD       0.9 %  sodium chloride infusion   Intravenous Continuous Johnson, Clanford L, MD 35 mL/hr at 12/12/22 1800 Infusion Verify at 12/12/22 1800   acetaminophen (TYLENOL) tablet 650 mg  650 mg Oral Q6H PRN Nolberto Hanlon, MD   650 mg at 12/13/22 1035   Or   acetaminophen (TYLENOL) suppository 650 mg  650 mg Rectal Q6H PRN Nolberto Hanlon, MD       ascorbic  acid (VITAMIN C) tablet 500 mg  500 mg Oral Daily Johnson, Clanford L, MD   500 mg at 12/13/22 0845   calcitRIOL (ROCALTROL) capsule 0.25 mcg  0.25 mcg Oral Daily Nolberto Hanlon, MD   0.25 mcg at 12/13/22 0839   cefTRIAXone (ROCEPHIN) 2 g in sodium chloride 0.9 % 100 mL IVPB  2 g Intravenous Q24H Johnson, Clanford L, MD 200 mL/hr at 12/13/22 1446 2 g at 12/13/22 1446   Chlorhexidine Gluconate Cloth 2 % PADS 6 each  6 each Topical Daily Nolberto Hanlon, MD   6 each at 12/13/22 0938   diltiazem (CARDIZEM CD) 24 hr capsule 240 mg  240 mg Oral Daily  Johnson, Clanford L, MD   240 mg at 12/13/22 6295   ferrous sulfate tablet 325 mg  325 mg Oral Q breakfast Johnson, Clanford L, MD   325 mg at 12/13/22 2841   Gerhardt's butt cream 1 Application  1 Application Topical TID PRN Cleora Fleet, MD   1 Application at 12/13/22 3244   hydrALAZINE (APRESOLINE) tablet 25 mg  25 mg Oral BID Johnson, Clanford L, MD   25 mg at 12/13/22 0839   HYDROmorphone (DILAUDID) injection 0.25 mg  0.25 mg Intravenous Q2H PRN Nolberto Hanlon, MD   0.25 mg at 12/13/22 1602   leptospermum manuka honey (MEDIHONEY) paste 1 Application  1 Application Topical Daily Laural Benes, Clanford L, MD   1 Application at 12/13/22 0938   linezolid (ZYVOX) IVPB 600 mg  600 mg Intravenous Q12H Johnson, Clanford L, MD 300 mL/hr at 12/13/22 1207 600 mg at 12/13/22 1207   metroNIDAZOLE (FLAGYL) IVPB 500 mg  500 mg Intravenous Q12H Johnson, Clanford L, MD 100 mL/hr at 12/13/22 1051 500 mg at 12/13/22 1051   multivitamin with minerals tablet 1 tablet  1 tablet Oral Daily Laural Benes, Clanford L, MD   1 tablet at 12/13/22 0102   nystatin (MYCOSTATIN/NYSTOP) topical powder   Topical TID Nolberto Hanlon, MD   Given at 12/13/22 1554   pantoprazole (PROTONIX) injection 40 mg  40 mg Intravenous Conchita Paris, MD   40 mg at 12/13/22 0838   polyethylene glycol (MIRALAX / GLYCOLAX) packet 17 g  17 g Oral Daily PRN Nolberto Hanlon, MD       sodium chloride flush (NS) 0.9 % injection 3 mL  3 mL Intravenous Q12H Nolberto Hanlon, MD   3 mL at 12/13/22 0842   sucralfate (CARAFATE) tablet 1 g  1 g Oral TID WC & HS Johnson, Clanford L, MD   1 g at 12/13/22 1551   zinc sulfate capsule 220 mg  220 mg Oral Daily Nolberto Hanlon, MD   220 mg at 12/13/22 7253     Discharge Medications: Please see discharge summary for a list of discharge medications.  Relevant Imaging Results:  Relevant Lab Results:   Additional Information SSN [id number] Juleen China, LCSW

## 2022-12-13 NOTE — TOC Initial Note (Signed)
Transition of Care [facility]) - Initial/Assessment Note    Patient Details  Name: [name] MRN: [id number] Date of Birth: 09-08-1918  [facility] CM/SW Contact:    Annice Needy, LCSW Phone Number: 12/13/2022, 4:00 PM  Clinical Narrative:                 Patient from home with caregivers 21 hours per day. Patient's son, [name], discussed that he was approved for Medicaid on Tuesday and that [facility] can take patient. He states that he spoke with palliative services and was advised that patient was appropriate for residential hospice. [name] wants to speak with his brother and sisters before making the decision about residential hospice. He states that he knows that patient will not d/c home. She will either go to residential hospice or LTC.   Expected Discharge Plan: Hospice Medical Facility     Patient Goals and CMS Choice            Expected Discharge Plan and Services                                              Prior Living Arrangements/Services                       Activities of Daily Living      Permission Sought/Granted Permission sought to share information with : Family Supports    Share Information with NAME: son, Larry/POA           Emotional Assessment              Admission diagnosis:  GI bleeding [K92.2] Anemia, unspecified type [D64.9] Pressure injury of sacral region, stage 3 (HCC) [L89.153] Patient Active Problem List   Diagnosis Date Noted   GI bleeding 12/11/2022   Protein-calorie malnutrition, severe 11/08/2022   Functional quadriplegia (HCC) 11/05/2022   Iron deficiency anemia 11/03/2022   Hypokalemia 11/02/2022   Hyponatremia 11/02/2022   Acute kidney injury superimposed on CKD (HCC) 11/02/2022   Abnormal blood electrolyte level 11/02/2022   Decubitus ulcer of buttock, stage 3 (HCC) 11/02/2022   Hypomagnesemia 11/02/2022   C. difficile colitis 10/24/2022   Sacral wound 10/23/2022    Generalized weakness 10/23/2022   CKD (chronic kidney disease) stage 4, GFR 15-29 ml/min (HCC) 10/21/2022   Chronic diarrhea 10/21/2022   Right rotator cuff tear 01/04/2013   Pain in joint, shoulder region 01/04/2013   Muscle weakness (generalized) 01/04/2013   Abdominal pain 12/14/2012   Blood in the urine 12/14/2012   Buzzing in ear 12/14/2012   Calculus of kidney 12/14/2012   Essential (primary) hypertension 12/14/2012   PNA (pneumonia) 12/14/2012   PCP:  Carylon Perches, MD Pharmacy:   [facility] [phone] - [place], [place] - [address] AT [address] OF [address] [place] [address] Phone: [phone] Fax: [phone]  [facility] [phone] - [place], [place] - [address] AT [facility] [address] [place] [address] Phone: [phone] Fax: [phone]     Social Determinants of Health (SDOH) Social History: SDOH Screenings   Food Insecurity: No Food Insecurity (11/02/2022)  Housing: Low Risk  (11/02/2022)  Transportation Needs: No Transportation Needs (11/02/2022)  Utilities: Not At Risk (11/02/2022)  Alcohol Screen: Low Risk  (09/17/2022)  Depression (PHQ2-9): Low Risk  (09/17/2022)  Financial Resource Strain: Low Risk  (09/17/2022)  Physical Activity: Insufficiently Active (09/17/2022)  Social Connections: Moderately Integrated (09/17/2022)  Stress: No Stress Concern Present (09/17/2022)  Tobacco Use: Low Risk  (12/12/2022)   SDOH Interventions:     Readmission Risk Interventions     No data to display

## 2022-12-13 NOTE — Progress Notes (Signed)
Alert to self and able to state name.  Drank all of broth for breakfast and half of an ensure.  Had large dark gooey stool. Dressing changed to sacral wound and medihoney, dry gauze and foam applied.  Garhart's Butt cream applied to red areas.  Chg wipes and foley care complete. Turned to left side. Neighbor of patient visited and daughter now at bedside.

## 2022-12-13 NOTE — TOC Progression Note (Signed)
Transition of Care [facility]) - Progression Note    Patient Details  Name: [name] MRN: [id number] Date of Birth: [birth year]  [facility]) CM/SW Contact  Annice Needy, LCSW Phone Number: 12/13/2022, 5:40 PM  Clinical Narrative:    Per nurse, son has spoken to siblings. Referral made to [facility].    Expected Discharge Plan: Hospice Medical Facility    Expected Discharge Plan and Services                                               Social Determinants of Health (SDOH) Interventions SDOH Screenings   Food Insecurity: No Food Insecurity (11/02/2022)  Housing: Low Risk  (11/02/2022)  Transportation Needs: No Transportation Needs (11/02/2022)  Utilities: Not At Risk (11/02/2022)  Alcohol Screen: Low Risk  (09/17/2022)  Depression (PHQ2-9): Low Risk  (09/17/2022)  Financial Resource Strain: Low Risk  (09/17/2022)  Physical Activity: Insufficiently Active (09/17/2022)  Social Connections: Moderately Integrated (09/17/2022)  Stress: No Stress Concern Present (09/17/2022)  Tobacco Use: Low Risk  (12/12/2022)    Readmission Risk Interventions     No data to display

## 2022-12-13 NOTE — Care Management Important Message (Signed)
Important Message  Patient Details  Name: [name] MRN: [id number] Date of Birth: 1918-07-26   Medicare Important Message Given:  Yes     Corey Harold 12/13/2022, 11:47 AM
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
## 2022-12-13 NOTE — Progress Notes (Signed)
   12/13/22 1332  Spiritual Encounters  Type of Visit Initial  Care provided to: Pt and family;Pt not available  Conversation partners present during encounter Nurse  Referral source Clinical staff  Reason for visit End-of-life  OnCall Visit No  Spiritual Framework  Presenting Themes Meaning/purpose/sources of inspiration;Significant life change;Coping tools  Community/Connection Family  Patient Stress Factors Exhausted;Family relationships  Family Stress Factors Exhausted;Family relationships   Chaplain Levon Hedger responded to a consult to visit Pt. Pt was lying on bed, quietly. Pt is not able to listen or see clearly, Chaplain interacted briefly with Pt, who shared with Chaplain that she was feeling fine. Chaplain met Pt's daughter, June, at Pt's bedside. Pt's daughter shared with Chaplain about her feelings about the situation and the ways she is coping with Pt's situation. Pt's daughter feels exhausted and experiencing the loss of her dog Kara Mead, who accompanied June for almost 18 years. Pt's daughter shared with Chaplain about feeling overwhelmed by these events in the life of the entire family. Pt's daughter shared with Chaplain her goal and the goal of the family is to provide Pt with quality of life. Pt's daughter recalled the words of advice of her mother and was very appreciative of an opportunity to verbalize and share all these feelings during Chaplain's visit. Pt asked Chaplain to keep Pt in his prayers for a peacful tranistion, as well as praying June's children and family.

## 2022-12-13 NOTE — Progress Notes (Signed)
PROGRESS NOTE   [name]  OZH:[id number] DOB: 1918-09-28 DOA: 12/11/2022 PCP: Carylon Perches, MD   Chief Complaint  Patient presents with   Placement   Level of care: Med-Surg  Brief Admission History:  89 y.o. female with medical history significant of blindness, hard of hearing, chronic disorientation, such functional weakness of all 4 extremities that she is referred to as functional quadriplegia.  She has known sacral decubitus ulcer, chronic diarrhea after bowel resection, prior pulmonary embolism.   Patient has a sacral decubitus ulcer that has been increasingly painful over the last couple of weeks and seems to have increased in size per family.  Patient has also had black liquid diarrhea for the last 1 week.  There is no report of nosebleed or gum bleed.  Patient was evaluated by Adult Protective Services and it was found that the patient does not have overnight care and home, and she needs 24 x 7 care.  Therefore patient was brought to the ER by Adult Protective Services.   Patient has no history of fever vomiting abdominal pain rash on skin.  ER workup shows a progressive sacral decubitus ulcer, marked anemia, medical evaluation was requested.   8/16; patient remained very lethargic.  Palliative care is on board, family would like to see response of antibiotics for the next couple of days before deciding about hospice. Currently on IV antibiotics for infected decubitus ulcers.  GI was consulted for concern of GI bleeding with melanotic stool, not a candidate for any invasive procedure.  Hemoglobin at 7.1, hypokalemia and hypomagnesemia which is being repleted.   Assessment and Plan:  GI bleeding/Melanotic stool Pt also with significant iron deficiency anemia Pt not a candidate for invasive procedures or diagnostics given advanced age and frailty Try to maintain Hg>7  Continue IV pantoprazole as ordered  Await palliative discussion regarding further transfusion for Hg<7    Hypokalemia (severe).  Potassium of 2.7 and magnesium of 1.6. This is being repleted IV Replete electrolyte and monitor  Unstageable chronic Sacral wound Seems to be a pressure injury, seems to be infected with undermining, blackening.  We will treat with turning position, vancomycin and Zosyn.  Will request wound care help.  This is known since at least June 2024, where an MRI in the system shows no osteomyelitis IV hydromorphone ordered for pain management   Essential (primary) hypertension Initially  held BP meds due to hypotension BP has rebounded after IV fluids Family requesting to resume home meds   Adult Failure to Thrive Consult to [facility] regarding APS findings and recommendations Palliative medicine consultation for goals of care discussions Family would like to see her response with IV antibiotics for the next couple of more days and then make further decision  DNR present on admission Continue DNR order while in hospital   DVT prophylaxis: SCDs Code Status: DNR  Family Communication: Discussed with daughter at bedside Disposition: TBD   Consultants:  TOC Palliative care  Procedures:   Antimicrobials:  Linezolid Metronidazole   Subjective: Patient appears lethargic, stating no when asked about pain.  Very hard of hearing.  Objective: Vitals:   12/12/22 2210 12/13/22 0316 12/13/22 0616 12/13/22 1333  BP: (!) 150/49 (!) 135/55 (!) 123/45 (!) 116/41  Pulse: 91 86 75 74  Resp: 17 16 16 15   Temp: 98.3 F (36.8 C) 98 F (36.7 C) 98 F (36.7 C) 97.8 F (36.6 C)  TempSrc: Oral     SpO2: 95% 97% 95% 96%  Weight:  Height:        Intake/Output Summary (Last 24 hours) at 12/13/2022 1623 Last data filed at 12/13/2022 0900 Gross per 24 hour  Intake 906.84 ml  Output 625 ml  Net 281.84 ml   Filed Weights   12/11/22 1308  Weight: 45.3 kg   Examination:  General.  Frail, malnourished and lethargic elderly lady, in no acute distress.  Very hard of  hearing Pulmonary.  Lungs clear bilaterally, normal respiratory effort. CV.  Regular rate and rhythm, no JVD, rub or murmur. Abdomen.  Soft, nontender, nondistended, BS positive. CNS.  Lethargic, able to answer few questions, no apparent focal deficit Extremities.  No edema, no cyanosis, pulses intact and symmetrical.   Data Reviewed: I have personally reviewed following labs and imaging studies  CBC: Recent Labs  Lab 12/11/22 1509 12/11/22 2254 12/12/22 0515 12/13/22 0503  WBC 17.4* 20.3* 21.1* 18.3*  NEUTROABS 15.0*  --   --  15.6*  HGB 6.1* 7.3* 7.3* 7.1*  HCT 19.2* 21.9* 22.6* 21.4*  MCV 75.9* 79.1* 80.4 80.5  PLT 723* 609* 649* 675*    Basic Metabolic Panel: Recent Labs  Lab 12/11/22 1509 12/12/22 0515 12/13/22 0503  NA 133* 132* 132*  K 2.0* 3.3* 2.7*  CL 90* 95* 98  CO2 31 28 28   GLUCOSE 98 80 101*  BUN 57* 50* 43*  CREATININE 1.80* 1.63* 1.52*  CALCIUM 8.6* 7.9* 7.6*  MG 1.8 1.7 1.6*  PHOS 3.0  --   --     CBG: Recent Labs  Lab 12/12/22 1144 12/12/22 1550  GLUCAP 70 100*    Recent Results (from the past 240 hour(s))  Urine Culture     Status: Abnormal   Collection Time: 12/11/22  3:00 PM   Specimen: Urine, Clean Catch  Result Value Ref Range Status   Specimen Description   Final    URINE, CLEAN CATCH Performed at [facility], [address], [place], [place] [address]    Special Requests   Final    NONE Performed at [facility], [address], [place], [place] [address]    Culture MULTIPLE SPECIES PRESENT, SUGGEST RECOLLECTION (A)  Final   Report Status 12/13/2022 FINAL  Final  Blood culture (routine x 2)     Status: None (Preliminary result)   Collection Time: 12/11/22  4:47 PM   Specimen: Right Antecubital; Blood  Result Value Ref Range Status   Specimen Description RIGHT ANTECUBITAL  Final   Special Requests   Final    BOTTLES DRAWN AEROBIC AND ANAEROBIC Blood Culture results may not be optimal due to an inadequate volume of blood  received in culture bottles   Culture   Final    NO GROWTH 2 DAYS Performed at [facility], [address], [place], [place] [address]    Report Status PENDING  Incomplete  Blood culture (routine x 2)     Status: None (Preliminary result)   Collection Time: 12/11/22  5:26 PM   Specimen: BLOOD  Result Value Ref Range Status   Specimen Description BLOOD LEFT ANTECUBITAL  Final   Special Requests   Final    BOTTLES DRAWN AEROBIC AND ANAEROBIC Blood Culture adequate volume   Culture   Final    NO GROWTH 2 DAYS Performed at [facility], [address], [place], [place] [address]    Report Status PENDING  Incomplete  MRSA Next Gen by PCR, Nasal     Status: None   Collection Time: 12/11/22  9:43 PM  Specimen: Nasal Mucosa; Nasal Swab  Result Value Ref Range Status   MRSA by PCR Next Gen NOT DETECTED NOT DETECTED Final    Comment: (NOTE) The GeneXpert MRSA Assay (FDA approved for NASAL specimens only), is one component of a comprehensive MRSA colonization surveillance program. It is not intended to diagnose MRSA infection nor to guide or monitor treatment for MRSA infections. Test performance is not FDA approved in patients less than 87 years old. Performed at [facility], [address] [place], [place] [address]      Radiology Studies: No results found.  Scheduled Meds:  sodium chloride   Intravenous Once   ascorbic acid  500 mg Oral Daily   calcitRIOL  0.25 mcg Oral Daily   Chlorhexidine Gluconate Cloth  6 each Topical Daily   diltiazem  240 mg Oral Daily   ferrous sulfate  325 mg Oral Q breakfast   hydrALAZINE  25 mg Oral BID   leptospermum manuka honey  1 Application Topical Daily   multivitamin with minerals  1 tablet Oral Daily   nystatin   Topical TID   pantoprazole (PROTONIX) IV  40 mg Intravenous Q12H   sodium chloride flush  3 mL Intravenous Q12H   sucralfate  1 g Oral TID WC & HS   zinc sulfate  220 mg Oral Daily   Continuous Infusions:  sodium chloride 35  mL/hr at 12/12/22 1800   cefTRIAXone (ROCEPHIN)  IV 2 g (12/13/22 1446)   linezolid (ZYVOX) IV 600 mg (12/13/22 1207)   metronidazole 500 mg (12/13/22 1051)     LOS: 2 days   Time spent: 45 mins  Arnetha Courser, MD How to contact the [facility] Attending or Consulting provider 7A - 7P or covering provider during after hours 7P -7A, for this patient?  Check the care team in [facility] and look for a) attending/consulting TRH provider listed and b) the [facility] team listed Log into [email or web address] and use [facility]'s universal password to access. If you do not have the password, please contact the hospital operator. Locate the [facility] provider you are looking for under [facility] and page to a number that you can be directly reached. If you still have difficulty reaching the provider, please page the [facility] (Director on Call) for the Hospitalists listed on amion for assistance.  12/13/2022, 4:23 PM

## 2022-12-14 DIAGNOSIS — S31000A Unspecified open wound of lower back and pelvis without penetration into retroperitoneum, initial encounter: Secondary | ICD-10-CM | POA: Diagnosis not present

## 2022-12-14 DIAGNOSIS — E876 Hypokalemia: Secondary | ICD-10-CM | POA: Diagnosis not present

## 2022-12-14 DIAGNOSIS — I10 Essential (primary) hypertension: Secondary | ICD-10-CM | POA: Diagnosis not present

## 2022-12-14 DIAGNOSIS — K921 Melena: Secondary | ICD-10-CM | POA: Diagnosis not present

## 2022-12-14 LAB — CBC
HCT: 21.1 % — ABNORMAL LOW (ref 36.0–46.0)
Hemoglobin: 6.9 g/dL — CL (ref 12.0–15.0)
MCH: 26.3 pg (ref 26.0–34.0)
MCHC: 32.7 g/dL (ref 30.0–36.0)
MCV: 80.5 fL (ref 80.0–100.0)
Platelets: 701 10*3/uL — ABNORMAL HIGH (ref 150–400)
RBC: 2.62 MIL/uL — ABNORMAL LOW (ref 3.87–5.11)
RDW: 18.7 % — ABNORMAL HIGH (ref 11.5–15.5)
WBC: 19.5 10*3/uL — ABNORMAL HIGH (ref 4.0–10.5)
nRBC: 0 % (ref 0.0–0.2)

## 2022-12-14 LAB — BASIC METABOLIC PANEL
Anion gap: 9 (ref 5–15)
BUN: 36 mg/dL — ABNORMAL HIGH (ref 8–23)
CO2: 22 mmol/L (ref 22–32)
Calcium: 7.6 mg/dL — ABNORMAL LOW (ref 8.9–10.3)
Chloride: 101 mmol/L (ref 98–111)
Creatinine, Ser: 1.43 mg/dL — ABNORMAL HIGH (ref 0.44–1.00)
GFR, Estimated: 32 mL/min — ABNORMAL LOW (ref >60)
Glucose, Bld: 115 mg/dL — ABNORMAL HIGH (ref 70–99)
Potassium: 2.6 mmol/L — CL (ref 3.5–5.1)
Sodium: 132 mmol/L — ABNORMAL LOW (ref 135–145)

## 2022-12-14 LAB — PREPARE RBC (CROSSMATCH)

## 2022-12-14 MED ORDER — SODIUM CHLORIDE 0.9% IV SOLUTION: Freq: Once | INTRAVENOUS | Status: DC

## 2022-12-14 MED ORDER — POLYVINYL ALCOHOL 1.4 % OP SOLN: 1.0000 [drp] | Freq: Four times a day (QID) | OPHTHALMIC | Status: DC | PRN

## 2022-12-14 MED ORDER — HYDROMORPHONE HCL 1 MG/ML IJ SOLN
0.5000 mg | INTRAMUSCULAR | Status: DC | PRN
  Administered 2022-12-14: 0.5 mg via INTRAVENOUS
  Filled 2022-12-14: qty 0.5

## 2022-12-14 MED ORDER — GLYCOPYRROLATE 0.2 MG/ML IJ SOLN: 0.2000 mg | INTRAMUSCULAR | Status: DC | PRN

## 2022-12-14 MED ORDER — HYDROMORPHONE HCL-NACL 50-0.9 MG/50ML-% IV SOLN
0.0000 mg/h | INTRAVENOUS | Status: DC
  Administered 2022-12-14: 1 mg/h via INTRAVENOUS
  Filled 2022-12-14: qty 50

## 2022-12-14 MED ORDER — SODIUM CHLORIDE 0.9 % IV SOLN: INTRAVENOUS | Status: DC

## 2022-12-14 MED ORDER — HYDROMORPHONE BOLUS VIA INFUSION: 1.0000 mg | INTRAVENOUS | Status: DC | PRN

## 2022-12-14 MED ORDER — ACETAMINOPHEN 325 MG PO TABS: 650.0000 mg | ORAL_TABLET | Freq: Four times a day (QID) | ORAL | Status: DC | PRN

## 2022-12-14 MED ORDER — ACETAMINOPHEN 650 MG RE SUPP: 650.0000 mg | Freq: Four times a day (QID) | RECTAL | Status: DC | PRN

## 2022-12-14 MED ORDER — POTASSIUM CHLORIDE 20 MEQ PO PACK
40.0000 meq | PACK | Freq: Two times a day (BID) | ORAL | Status: DC
  Administered 2022-12-14: 40 meq via ORAL
  Filled 2022-12-14: qty 2

## 2022-12-14 MED ORDER — LORAZEPAM 2 MG/ML IJ SOLN
2.0000 mg | INTRAMUSCULAR | Status: DC | PRN
  Administered 2022-12-15 – 2022-12-16 (×4): 2 mg via INTRAVENOUS
  Filled 2022-12-14 (×4): qty 1

## 2022-12-14 MED ORDER — GLYCOPYRROLATE 1 MG PO TABS: 1.0000 mg | ORAL_TABLET | ORAL | Status: DC | PRN

## 2022-12-14 NOTE — Progress Notes (Signed)
Hospice in to speak to pt's family. POA here for meeting. Decision made by [name] and June to make [email or web address] care at this time. Family requests that pt.not have wound care or any painful interventions without appropriate pain medication.. RN in room while Hospice spoke to family.   Family aware and agree with stopping antibiotics, no blood or any interventions causing undue stress or pain for pt.   Dr.Alexander notified of above.

## 2022-12-14 NOTE — Progress Notes (Signed)
PROGRESS NOTE    [name]   ZOX:[id number] DOB: 11/27/1918  DOA: 12/11/2022 Date of Service: 12/14/22 PCP: Carylon Perches, MD     Brief Narrative / Hospital Course:  89 y.o. female with medical history significant of blindness, hard of hearing, chronic disorientation, weakness of all 4 extremities that she is functional quadriplegia.  She has known sacral decubitus ulcer, chronic diarrhea after bowel resection, prior pulmonary embolism.   Patient has a sacral decubitus ulcer that has been increasingly painful over the last couple of weeks and seems to have increased in size per family.  Patient has also had black liquid diarrhea for the last 1 week.  There is no report of nosebleed or gum bleed.  Patient was evaluated by Adult Protective Services and it was found that the patient does not have overnight care and home, and she needs 24 x 7 care.  Therefore patient was brought to the ER by Adult Protective Services.   Hospital course:  08/14: ER workup shows a progressive sacral decubitus ulcer, marked anemia, admitted to medicine service. GI bleeding with melanotic stool, not a candidate for any invasive procedure per GI.  Hemoglobin at 7.1, hypokalemia and hypomagnesemia which is being repleted. 08/15: continue on abx, pain control, palliative med consult placed.  08/16; patient remained very lethargic.  Palliative care is on board, family would like to see response of antibiotics for the next couple of days before deciding about hospice but plan probably for hospice placement  08/17: spoke w/ son [name] and daughter in law - confirmed plan for d/c aggressive measures w/ abx, transfusions, labs, vitals etc and transition to comfort measures / hospice    Consultants:  Palliative Care  Procedures: none      ASSESSMENT & PLAN:   Principal Problem:   GI bleeding Active Problems:   Essential (primary) hypertension   Sacral wound   Hypokalemia  GI bleeding/Melanotic  stool Significant iron deficiency anemia Pt not a candidate for invasive procedures or diagnostics given advanced age and frailty Comfort measures, d/c labs, no PRBC   Hypokalemia (severe) On admission, Potassium of 2.7 and magnesium of 1.6. Comfort measures, d/c labs   Unstageable chronic Sacral wound Seems to be a pressure injury, seems to be infected with undermining, blackening.   Wound care Reposition as toelrated for comfort  IV hydromorphone ordered for pain management    Essential (primary) hypertension Initially held BP meds due to hypotension D/c meds on comfort measures    Adult Failure to Thrive Consult to [facility] regarding APS findings and recommendations Palliative medicine consultation for goals of care discussions Comfort measures, d/c labs, abx, VS    DNR present on admission Continue DNR order     DVT prophylaxis: SCD Pertinent IV fluids/nutrition: diet as desired / as tolerated  Central lines / invasive devices: Foley cath given incontinence, sacral wound    Code Status: DNR ACP documentation reviewed: 12/14/22 none   Current Admission Status: inpatient  TOC needs / Dispo plan: hospice facility  Barriers to discharge / significant pending items: hospice placement               Subjective / Brief ROS:  Patient reports no problems Per RN, significant pain w/ repositioning and pericare    Family Communication: spoke at bedside w/ son [name] and his wife Corrie Dandy see above     Objective Findings:  Vitals:   12/13/22 1333 12/13/22 2112 12/14/22 0552 12/14/22 0859  BP: (!) 116/41 (!) 134/49 Marland Kitchen)  119/42 (!) 107/45  Pulse: 74 84 81 82  Resp: 15 16 16 15   Temp: 97.8 F (36.6 C) 97.8 F (36.6 C) (!) 97.5 F (36.4 C) 97.9 F (36.6 C)  TempSrc:  Oral Oral Oral  SpO2: 96% 99% 96% 94%  Weight:      Height:        Intake/Output Summary (Last 24 hours) at 12/14/2022 1859 Last data filed at 12/14/2022 1700 Gross per 24 hour  Intake 1360 ml   Output 600 ml  Net 760 ml   Filed Weights   12/11/22 1308  Weight: 45.3 kg    Examination:  Physical Exam Constitutional:      General: She is not in acute distress.    Appearance: She is ill-appearing.  Cardiovascular:     Rate and Rhythm: Normal rate and regular rhythm.  Pulmonary:     Effort: Pulmonary effort is normal. No respiratory distress.  Abdominal:     General: Abdomen is flat.     Palpations: Abdomen is soft.  Skin:    General: Skin is warm and dry.  Neurological:     Mental Status: She is alert. Mental status is at baseline.  Psychiatric:        Behavior: Behavior normal.          Scheduled Medications:   ascorbic acid  500 mg Oral Daily   Chlorhexidine Gluconate Cloth  6 each Topical Daily   diltiazem  240 mg Oral Daily   ferrous sulfate  325 mg Oral Q breakfast   hydrALAZINE  25 mg Oral BID   leptospermum manuka honey  1 Application Topical Daily   multivitamin with minerals  1 tablet Oral Daily   nystatin   Topical TID   sodium chloride flush  3 mL Intravenous Q12H   sucralfate  1 g Oral TID WC & HS   zinc sulfate  220 mg Oral Daily    Continuous Infusions:  HYDROmorphone      PRN Medications:  acetaminophen **OR** acetaminophen, Gerhardt's butt cream, glycopyrrolate **OR** glycopyrrolate **OR** glycopyrrolate, LORazepam, polyethylene glycol, polyvinyl alcohol  Antimicrobials from admission:  Anti-infectives (From admission, onward)    Start     Dose/Rate Route Frequency Ordered Stop   12/13/22 1700  vancomycin (VANCOREADY) IVPB 500 mg/100 mL  Status:  Discontinued        500 mg 100 mL/hr over 60 Minutes Intravenous Every 48 hours 12/11/22 1615 12/12/22 0849   12/12/22 2200  linezolid (ZYVOX) IVPB 600 mg  Status:  Discontinued        600 mg 300 mL/hr over 60 Minutes Intravenous Every 12 hours 12/12/22 0849 12/14/22 1856   12/12/22 1400  cefTRIAXone (ROCEPHIN) 2 g in sodium chloride 0.9 % 100 mL IVPB  Status:  Discontinued        2  g 200 mL/hr over 30 Minutes Intravenous Every 24 hours 12/12/22 0849 12/14/22 1856   12/12/22 1000  metroNIDAZOLE (FLAGYL) IVPB 500 mg  Status:  Discontinued        500 mg 100 mL/hr over 60 Minutes Intravenous Every 12 hours 12/12/22 0849 12/14/22 1856   12/11/22 2100  piperacillin-tazobactam (ZOSYN) IVPB 2.25 g  Status:  Discontinued        2.25 g 100 mL/hr over 30 Minutes Intravenous Every 8 hours 12/11/22 1937 12/12/22 0849   12/11/22 1615  cefTRIAXone (ROCEPHIN) 1 g in sodium chloride 0.9 % 100 mL IVPB        1 g 200 mL/hr over  30 Minutes Intravenous  Once 12/11/22 1605 12/11/22 1913   12/11/22 1545  vancomycin (VANCOCIN) IVPB 1000 mg/200 mL premix        1,000 mg 200 mL/hr over 60 Minutes Intravenous  Once 12/11/22 1536 12/11/22 1841           Data Reviewed:  I have personally reviewed the following...  CBC: Recent Labs  Lab 12/11/22 1509 12/11/22 2254 12/12/22 0515 12/13/22 0503 12/14/22 0438  WBC 17.4* 20.3* 21.1* 18.3* 19.5*  NEUTROABS 15.0*  --   --  15.6*  --   HGB 6.1* 7.3* 7.3* 7.1* 6.9*  HCT 19.2* 21.9* 22.6* 21.4* 21.1*  MCV 75.9* 79.1* 80.4 80.5 80.5  PLT 723* 609* 649* 675* 701*   Basic Metabolic Panel: Recent Labs  Lab 12/11/22 1509 12/12/22 0515 12/13/22 0503 12/14/22 0438  NA 133* 132* 132* 132*  K 2.0* 3.3* 2.7* 2.6*  CL 90* 95* 98 101  CO2 31 28 28 22   GLUCOSE 98 80 101* 115*  BUN 57* 50* 43* 36*  CREATININE 1.80* 1.63* 1.52* 1.43*  CALCIUM 8.6* 7.9* 7.6* 7.6*  MG 1.8 1.7 1.6*  --   PHOS 3.0  --   --   --    GFR: Estimated Creatinine Clearance: 14.2 mL/min (A) (by C-G formula based on SCr of 1.43 mg/dL (H)). Liver Function Tests: Recent Labs  Lab 12/11/22 1509  AST 13*  ALT 11  ALKPHOS 66  BILITOT 0.4  PROT 5.1*  ALBUMIN 1.7*   No results for input(s): "LIPASE", "AMYLASE" in the last 168 hours. No results for input(s): "AMMONIA" in the last 168 hours. Coagulation Profile: Recent Labs  Lab 12/12/22 0515  INR 1.4*    Cardiac Enzymes: No results for input(s): "CKTOTAL", "CKMB", "CKMBINDEX", "TROPONINI" in the last 168 hours. BNP (last 3 results) No results for input(s): "PROBNP" in the last 8760 hours. HbA1C: No results for input(s): "HGBA1C" in the last 72 hours. CBG: Recent Labs  Lab 12/12/22 1144 12/12/22 1550  GLUCAP 70 100*   Lipid Profile: No results for input(s): "CHOL", "HDL", "LDLCALC", "TRIG", "CHOLHDL", "LDLDIRECT" in the last 72 hours. Thyroid Function Tests: No results for input(s): "TSH", "T4TOTAL", "FREET4", "T3FREE", "THYROIDAB" in the last 72 hours. Anemia Panel: No results for input(s): "VITAMINB12", "FOLATE", "FERRITIN", "TIBC", "IRON", "RETICCTPCT" in the last 72 hours. Most Recent Urinalysis On File:     Component Value Date/Time   COLORURINE YELLOW 12/11/2022 1500   APPEARANCEUR TURBID (A) 12/11/2022 1500   LABSPEC RESULTS UNAVAILABLE DUE TO INTERFERING SUBSTANCE 12/11/2022 1500   PHURINE RESULTS UNAVAILABLE DUE TO INTERFERING SUBSTANCE 12/11/2022 1500   GLUCOSEU RESULTS UNAVAILABLE DUE TO INTERFERING SUBSTANCE (A) 12/11/2022 1500   HGBUR RESULTS UNAVAILABLE DUE TO INTERFERING SUBSTANCE (A) 12/11/2022 1500   BILIRUBINUR RESULTS UNAVAILABLE DUE TO INTERFERING SUBSTANCE (A) 12/11/2022 1500   KETONESUR RESULTS UNAVAILABLE DUE TO INTERFERING SUBSTANCE (A) 12/11/2022 1500   PROTEINUR RESULTS UNAVAILABLE DUE TO INTERFERING SUBSTANCE (A) 12/11/2022 1500   UROBILINOGEN 0.2 08/16/2012 0843   NITRITE RESULTS UNAVAILABLE DUE TO INTERFERING SUBSTANCE (A) 12/11/2022 1500   LEUKOCYTESUR RESULTS UNAVAILABLE DUE TO INTERFERING SUBSTANCE (A) 12/11/2022 1500   Sepsis Labs: @LABRCNTIP (procalcitonin:4,lacticidven:4) Microbiology: Recent Results (from the past 240 hour(s))  Urine Culture     Status: Abnormal   Collection Time: 12/11/22  3:00 PM   Specimen: Urine, Clean Catch  Result Value Ref Range Status   Specimen Description   Final    URINE, CLEAN CATCH Performed at [facility], [address],  [place], [place] [address]    Special Requests   Final    NONE Performed at [facility], [address] [place], [place] [address]    Culture MULTIPLE SPECIES PRESENT, SUGGEST RECOLLECTION (A)  Final   Report Status 12/13/2022 FINAL  Final  Blood culture (routine x 2)     Status: None (Preliminary result)   Collection Time: 12/11/22  4:47 PM   Specimen: Right Antecubital; Blood  Result Value Ref Range Status   Specimen Description RIGHT ANTECUBITAL  Final   Special Requests   Final    BOTTLES DRAWN AEROBIC AND ANAEROBIC Blood Culture results may not be optimal due to an inadequate volume of blood received in culture bottles   Culture   Final    NO GROWTH 3 DAYS Performed at [facility], [address], [place], [place] [address]    Report Status PENDING  Incomplete  Blood culture (routine x 2)     Status: None (Preliminary result)   Collection Time: 12/11/22  5:26 PM   Specimen: BLOOD  Result Value Ref Range Status   Specimen Description BLOOD LEFT ANTECUBITAL  Final   Special Requests   Final    BOTTLES DRAWN AEROBIC AND ANAEROBIC Blood Culture adequate volume   Culture   Final    NO GROWTH 3 DAYS Performed at [facility], [address] [place], [place] [address]    Report Status PENDING  Incomplete  MRSA Next Gen by PCR, Nasal     Status: None   Collection Time: 12/11/22  9:43 PM   Specimen: Nasal Mucosa; Nasal Swab  Result Value Ref Range Status   MRSA by PCR Next Gen NOT DETECTED NOT DETECTED Final    Comment: (NOTE) The GeneXpert MRSA Assay (FDA approved for NASAL specimens only), is one component of a comprehensive MRSA colonization surveillance program. It is not intended to diagnose MRSA infection nor to guide or monitor treatment for MRSA infections. Test performance is not FDA approved in patients less than 65 years old. Performed at [facility], [address], [place], [place] [address]       Radiology Studies last 3 days: No results  found.           LOS: 3 days       Sunnie Nielsen, DO [facility] 12/14/2022, 6:59 PM    Dictation software may have been used to generate the above note. Typos may occur and escape review in typed/dictated notes. Please contact Dr Lyn Hollingshead directly for clarity if needed.  Staff may message me via secure chat in Epic  but this may not receive an immediate response,  please page me for urgent matters!  If 7PM-7AM, please contact night coverage [email or web address]

## 2022-12-15 DIAGNOSIS — S31000A Unspecified open wound of lower back and pelvis without penetration into retroperitoneum, initial encounter: Secondary | ICD-10-CM | POA: Diagnosis not present

## 2022-12-15 DIAGNOSIS — E876 Hypokalemia: Secondary | ICD-10-CM | POA: Diagnosis not present

## 2022-12-15 DIAGNOSIS — I10 Essential (primary) hypertension: Secondary | ICD-10-CM | POA: Diagnosis not present

## 2022-12-15 DIAGNOSIS — K921 Melena: Secondary | ICD-10-CM | POA: Diagnosis not present

## 2022-12-15 LAB — TYPE AND SCREEN
ABO/RH(D): AB POS
Antibody Screen: NEGATIVE
Unit division: 0
Unit division: 0

## 2022-12-15 LAB — BPAM RBC
Blood Product Expiration Date: 202409142359
Blood Product Expiration Date: 202409172359
ISSUE DATE / TIME: 202408141850
Unit Type and Rh: 6200
Unit Type and Rh: 6200

## 2022-12-15 MED ORDER — MORPHINE SULFATE (CONCENTRATE) 10 MG/0.5ML PO SOLN
5.0000 mg | ORAL | Status: DC | PRN
  Administered 2022-12-16 (×2): 10 mg via ORAL
  Filled 2022-12-15 (×4): qty 0.5

## 2022-12-15 MED ORDER — MORPHINE SULFATE 10 MG/5ML PO SOLN: 5.0000 mg | ORAL | Status: DC | PRN

## 2022-12-15 NOTE — Progress Notes (Signed)
PROGRESS NOTE    EULIA CHAPELL   AOZ:[id number] DOB: [birth year]  DOA: 12/11/2022 Date of Service: 12/15/22 PCP: Carylon Perches, MD     Brief Narrative / Hospital Course:  89 y.o. female with medical history significant of blindness, hard of hearing, chronic disorientation, weakness of all 4 extremities that she is functional quadriplegia.  She has known sacral decubitus ulcer, chronic diarrhea after bowel resection, prior pulmonary embolism.   Patient has a sacral decubitus ulcer that has been increasingly painful over the last couple of weeks and seems to have increased in size per family.  Patient has also had black liquid diarrhea for the last 1 week.  There is no report of nosebleed or gum bleed.  Patient was evaluated by Adult Protective Services and it was found that the patient does not have overnight care and home, and she needs 24 x 7 care.  Therefore patient was brought to the ER by Adult Protective Services.   Hospital course:  08/14: ER workup shows a progressive sacral decubitus ulcer, marked anemia, admitted to medicine service. GI bleeding with melanotic stool, not a candidate for any invasive procedure per GI.  Hemoglobin at 7.1, hypokalemia and hypomagnesemia which is being repleted. 08/15: continue on abx, pain control, palliative med consult placed.  08/16; patient remained very lethargic.  Palliative care is on board, family would like to see response of antibiotics for the next couple of days before deciding about hospice but plan probably for hospice placement  08/17: spoke w/ son [name] and daughter in law - confirmed plan for d/c aggressive measures w/ abx, transfusions, labs, vitals etc and transition to comfort measures / hospice  08/18: hospice placement pending availability/confirmation from admin at facility re: APS concerns on their end. Confirmed w/ son, [name], as I cannot reach other siblings, that all the patient's children are in consensus for comfort measures  only and no aggressive treatment / all the patient's children are in agreement for hospice transfer. [name] says he has some POA paperwork that names him and his brother as her decision makers, I advised him to have a copy ready in case TOC/hospice needs that documentation / confirm is it HCPOA not financial POA.    Consultants:  Palliative Care  Procedures: none      ASSESSMENT & PLAN:   Principal Problem:   GI bleeding Active Problems:   Essential (primary) hypertension   Sacral wound   Hypokalemia  GI bleeding/Melanotic stool Significant iron deficiency anemia Pt not a candidate for invasive procedures or diagnostics given advanced age and frailty Comfort measures, d/c labs, no PRBC   Hypokalemia (severe) On admission, Potassium of 2.7 and magnesium of 1.6. Comfort measures, d/c labs   Unstageable chronic Sacral wound Seems to be a pressure injury, seems to be infected with undermining, blackening.   Wound care Reposition as toelrated for comfort  IV hydromorphone ordered for pain management --> po liquid morphine anticipating hospice facility cannot do drips    Essential (primary) hypertension Initially held BP meds due to hypotension D/c meds on comfort measures    Adult Failure to Thrive Consult to [facility] regarding APS findings and recommendations Palliative medicine consultation for goals of care discussions Comfort measures, d/c labs, abx, VS    DNR present on admission Continue DNR order     DVT prophylaxis: SCD Pertinent IV fluids/nutrition: diet as desired / as tolerated  Central lines / invasive devices: Foley cath given incontinence, sacral wound    Code Status: DNR  ACP documentation reviewed: 12/14/22 none   Current Admission Status: inpatient  TOC needs / Dispo plan: hospice facility  Barriers to discharge / significant pending items: hospice placement               Subjective / Brief ROS:  Patient resting comfortably    Family  Communication: spoke over the phone w/ son [name] 12/15/22 1:23 PM     Objective Findings:  Vitals:   12/13/22 2112 12/14/22 0552 12/14/22 0859 12/14/22 2129  BP: (!) 134/49 (!) 119/42 (!) 107/45 (!) 152/58  Pulse: 84 81 82 84  Resp: 16 16 15 16   Temp: 97.8 F (36.6 C) (!) 97.5 F (36.4 C) 97.9 F (36.6 C) 97.7 F (36.5 C)  TempSrc: Oral Oral Oral Oral  SpO2: 99% 96% 94% 95%  Weight:      Height:        Intake/Output Summary (Last 24 hours) at 12/15/2022 1323 Last data filed at 12/14/2022 2131 Gross per 24 hour  Intake 480 ml  Output 700 ml  Net -220 ml   Filed Weights   12/11/22 1308  Weight: 45.3 kg    Examination:  Physical Exam Constitutional:      General: She is not in acute distress.    Appearance: She is ill-appearing.  Cardiovascular:     Rate and Rhythm: Normal rate and regular rhythm.  Pulmonary:     Effort: Pulmonary effort is normal. No respiratory distress.  Abdominal:     General: Abdomen is flat.     Palpations: Abdomen is soft.  Skin:    General: Skin is warm and dry.  Neurological:     Mental Status: She is alert. Mental status is at baseline.  Psychiatric:        Behavior: Behavior normal.          Scheduled Medications:   ascorbic acid  500 mg Oral Daily   Chlorhexidine Gluconate Cloth  6 each Topical Daily   diltiazem  240 mg Oral Daily   ferrous sulfate  325 mg Oral Q breakfast   hydrALAZINE  25 mg Oral BID   leptospermum manuka honey  1 Application Topical Daily   multivitamin with minerals  1 tablet Oral Daily   nystatin   Topical TID   sodium chloride flush  3 mL Intravenous Q12H   sucralfate  1 g Oral TID WC & HS   zinc sulfate  220 mg Oral Daily    Continuous Infusions:    PRN Medications:  acetaminophen **OR** acetaminophen, Gerhardt's butt cream, glycopyrrolate **OR** glycopyrrolate **OR** glycopyrrolate, LORazepam, morphine, polyethylene glycol, polyvinyl alcohol  Antimicrobials from admission:   Anti-infectives (From admission, onward)    Start     Dose/Rate Route Frequency Ordered Stop   12/13/22 1700  vancomycin (VANCOREADY) IVPB 500 mg/100 mL  Status:  Discontinued        500 mg 100 mL/hr over 60 Minutes Intravenous Every 48 hours 12/11/22 1615 12/12/22 0849   12/12/22 2200  linezolid (ZYVOX) IVPB 600 mg  Status:  Discontinued        600 mg 300 mL/hr over 60 Minutes Intravenous Every 12 hours 12/12/22 0849 12/14/22 1856   12/12/22 1400  cefTRIAXone (ROCEPHIN) 2 g in sodium chloride 0.9 % 100 mL IVPB  Status:  Discontinued        2 g 200 mL/hr over 30 Minutes Intravenous Every 24 hours 12/12/22 0849 12/14/22 1856   12/12/22 1000  metroNIDAZOLE (FLAGYL) IVPB 500 mg  Status:  Discontinued        500 mg 100 mL/hr over 60 Minutes Intravenous Every 12 hours 12/12/22 0849 12/14/22 1856   12/11/22 2100  piperacillin-tazobactam (ZOSYN) IVPB 2.25 g  Status:  Discontinued        2.25 g 100 mL/hr over 30 Minutes Intravenous Every 8 hours 12/11/22 1937 12/12/22 0849   12/11/22 1615  cefTRIAXone (ROCEPHIN) 1 g in sodium chloride 0.9 % 100 mL IVPB        1 g 200 mL/hr over 30 Minutes Intravenous  Once 12/11/22 1605 12/11/22 1913   12/11/22 1545  vancomycin (VANCOCIN) IVPB 1000 mg/200 mL premix        1,000 mg 200 mL/hr over 60 Minutes Intravenous  Once 12/11/22 1536 12/11/22 1841           Data Reviewed:  I have personally reviewed the following...  CBC: Recent Labs  Lab 12/11/22 1509 12/11/22 2254 12/12/22 0515 12/13/22 0503 12/14/22 0438  WBC 17.4* 20.3* 21.1* 18.3* 19.5*  NEUTROABS 15.0*  --   --  15.6*  --   HGB 6.1* 7.3* 7.3* 7.1* 6.9*  HCT 19.2* 21.9* 22.6* 21.4* 21.1*  MCV 75.9* 79.1* 80.4 80.5 80.5  PLT 723* 609* 649* 675* 701*   Basic Metabolic Panel: Recent Labs  Lab 12/11/22 1509 12/12/22 0515 12/13/22 0503 12/14/22 0438  NA 133* 132* 132* 132*  K 2.0* 3.3* 2.7* 2.6*  CL 90* 95* 98 101  CO2 31 28 28 22   GLUCOSE 98 80 101* 115*  BUN 57* 50* 43*  36*  CREATININE 1.80* 1.63* 1.52* 1.43*  CALCIUM 8.6* 7.9* 7.6* 7.6*  MG 1.8 1.7 1.6*  --   PHOS 3.0  --   --   --    GFR: Estimated Creatinine Clearance: 14.2 mL/min (A) (by C-G formula based on SCr of 1.43 mg/dL (H)). Liver Function Tests: Recent Labs  Lab 12/11/22 1509  AST 13*  ALT 11  ALKPHOS 66  BILITOT 0.4  PROT 5.1*  ALBUMIN 1.7*   No results for input(s): "LIPASE", "AMYLASE" in the last 168 hours. No results for input(s): "AMMONIA" in the last 168 hours. Coagulation Profile: Recent Labs  Lab 12/12/22 0515  INR 1.4*   Cardiac Enzymes: No results for input(s): "CKTOTAL", "CKMB", "CKMBINDEX", "TROPONINI" in the last 168 hours. BNP (last 3 results) No results for input(s): "PROBNP" in the last 8760 hours. HbA1C: No results for input(s): "HGBA1C" in the last 72 hours. CBG: Recent Labs  Lab 12/12/22 1144 12/12/22 1550  GLUCAP 70 100*   Lipid Profile: No results for input(s): "CHOL", "HDL", "LDLCALC", "TRIG", "CHOLHDL", "LDLDIRECT" in the last 72 hours. Thyroid Function Tests: No results for input(s): "TSH", "T4TOTAL", "FREET4", "T3FREE", "THYROIDAB" in the last 72 hours. Anemia Panel: No results for input(s): "VITAMINB12", "FOLATE", "FERRITIN", "TIBC", "IRON", "RETICCTPCT" in the last 72 hours. Most Recent Urinalysis On File:     Component Value Date/Time   COLORURINE YELLOW 12/11/2022 1500   APPEARANCEUR TURBID (A) 12/11/2022 1500   LABSPEC RESULTS UNAVAILABLE DUE TO INTERFERING SUBSTANCE 12/11/2022 1500   PHURINE RESULTS UNAVAILABLE DUE TO INTERFERING SUBSTANCE 12/11/2022 1500   GLUCOSEU RESULTS UNAVAILABLE DUE TO INTERFERING SUBSTANCE (A) 12/11/2022 1500   HGBUR RESULTS UNAVAILABLE DUE TO INTERFERING SUBSTANCE (A) 12/11/2022 1500   BILIRUBINUR RESULTS UNAVAILABLE DUE TO INTERFERING SUBSTANCE (A) 12/11/2022 1500   KETONESUR RESULTS UNAVAILABLE DUE TO INTERFERING SUBSTANCE (A) 12/11/2022 1500   PROTEINUR RESULTS UNAVAILABLE DUE TO INTERFERING SUBSTANCE (A)  12/11/2022 1500   UROBILINOGEN 0.2 08/16/2012  4782   NITRITE RESULTS UNAVAILABLE DUE TO INTERFERING SUBSTANCE (A) 12/11/2022 1500   LEUKOCYTESUR RESULTS UNAVAILABLE DUE TO INTERFERING SUBSTANCE (A) 12/11/2022 1500   Sepsis Labs: @LABRCNTIP (procalcitonin:4,lacticidven:4) Microbiology: Recent Results (from the past 240 hour(s))  Urine Culture     Status: Abnormal   Collection Time: 12/11/22  3:00 PM   Specimen: Urine, Clean Catch  Result Value Ref Range Status   Specimen Description   Final    URINE, CLEAN CATCH Performed at [facility], [address], [place], [place] [address]    Special Requests   Final    NONE Performed at [facility], [address] [place], [place] [address]    Culture MULTIPLE SPECIES PRESENT, SUGGEST RECOLLECTION (A)  Final   Report Status 12/13/2022 FINAL  Final  Blood culture (routine x 2)     Status: None (Preliminary result)   Collection Time: 12/11/22  4:47 PM   Specimen: Right Antecubital; Blood  Result Value Ref Range Status   Specimen Description RIGHT ANTECUBITAL  Final   Special Requests   Final    BOTTLES DRAWN AEROBIC AND ANAEROBIC Blood Culture results may not be optimal due to an inadequate volume of blood received in culture bottles   Culture   Final    NO GROWTH 4 DAYS Performed at [facility], [address] [place], [place] [address]    Report Status PENDING  Incomplete  Blood culture (routine x 2)     Status: None (Preliminary result)   Collection Time: 12/11/22  5:26 PM   Specimen: BLOOD  Result Value Ref Range Status   Specimen Description BLOOD LEFT ANTECUBITAL  Final   Special Requests   Final    BOTTLES DRAWN AEROBIC AND ANAEROBIC Blood Culture adequate volume   Culture   Final    NO GROWTH 4 DAYS Performed at [facility], [address] [place], [place] [address]    Report Status PENDING  Incomplete  MRSA Next Gen by PCR, Nasal     Status: None   Collection Time: 12/11/22  9:43 PM   Specimen: Nasal Mucosa; Nasal Swab   Result Value Ref Range Status   MRSA by PCR Next Gen NOT DETECTED NOT DETECTED Final    Comment: (NOTE) The GeneXpert MRSA Assay (FDA approved for NASAL specimens only), is one component of a comprehensive MRSA colonization surveillance program. It is not intended to diagnose MRSA infection nor to guide or monitor treatment for MRSA infections. Test performance is not FDA approved in patients less than 26 years old. Performed at [facility], [address], [place], [place] [address]       Radiology Studies last 3 days: No results found.           LOS: 4 days       Sunnie Nielsen, DO [facility] 12/15/2022, 1:23 PM    Dictation software may have been used to generate the above note. Typos may occur and escape review in typed/dictated notes. Please contact Dr Lyn Hollingshead directly for clarity if needed.  Staff may message me via secure chat in Epic  but this may not receive an immediate response,  please page me for urgent matters!  If 7PM-7AM, please contact night coverage [email or web address]

## 2022-12-15 NOTE — Plan of Care (Signed)
  Problem: Education: Goal: Knowledge of General Education information will improve Description Including pain rating scale, medication(s)/side effects and non-pharmacologic comfort measures Outcome: Progressing   Problem: Health Behavior/Discharge Planning: Goal: Ability to manage health-related needs will improve Outcome: Progressing   

## 2022-12-16 DIAGNOSIS — K921 Melena: Secondary | ICD-10-CM | POA: Diagnosis not present

## 2022-12-16 LAB — CULTURE, BLOOD (ROUTINE X 2)
Culture: NO GROWTH
Culture: NO GROWTH
Special Requests: ADEQUATE

## 2022-12-16 NOTE — Progress Notes (Signed)
Palliative: Chart review completed.  [name] family has elected comfort and dignity at end-of-life, residential hospice at [facility]. No symptom management needs at this time. Conference with transition of care team related to patient condition, needs, goals of care, disposition. DNR/goldenrod form on chart.  No charge Miranda Carmel, NP Palliative medicine team Team phone [phone] Greater than percent of this time was spent counseling and coordinating care related to the above assessment and plan.

## 2022-12-16 NOTE — Discharge Summary (Signed)
Physician Discharge Summary   Patient: [name] MRN: [id number] DOB: 1918/11/13  Admit date:     12/11/2022  Discharge date: 12/16/22  Discharge Physician: Kendell Bane   PCP: Carylon Perches, MD   Recommendations at discharge:    Follow-up with hospice  Discharge Diagnoses: Principal Problem:   GI bleeding Active Problems:   Essential (primary) hypertension   Sacral wound   Hypokalemia  Resolved Problems:   * No resolved hospital problems. *  Hospital Course: [name] is a 89 y.o. female with medical history significant of blindness, hard of hearing, chronic disorientation, weakness of all 4 extremities that she is functional quadriplegia.  She has known sacral decubitus ulcer, chronic diarrhea after bowel resection, prior pulmonary embolism.   Patient has a sacral decubitus ulcer that has been increasingly painful over the last couple of weeks and seems to have increased in size per family.  Patient has also had black liquid diarrhea for the last 1 week.  There is no report of nosebleed or gum bleed.  Patient was evaluated by Adult Protective Services and it was found that the patient does not have overnight care and home, and she needs 24 x 7 care.  Therefore patient was brought to the ER by Adult Protective Services.   Hospital course:  08/14: ER workup shows a progressive sacral decubitus ulcer, marked anemia, admitted to medicine service. GI bleeding with melanotic stool, not a candidate for any invasive procedure per GI.  Hemoglobin at 7.1, hypokalemia and hypomagnesemia which is being repleted. 08/15: continue on abx, pain control, palliative med consult placed.  08/16; patient remained very lethargic.  Palliative care is on board, family would like to see response of antibiotics for the next couple of days before deciding about hospice but plan probably for hospice placement  08/17: spoke w/ son [name] and daughter in law - confirmed plan for d/c aggressive  measures w/ abx, transfusions, labs, vitals etc and transition to comfort measures / hospice  08/18: hospice placement pending availability/confirmation from admin at facility re: APS concerns on their end. Confirmed w/ son, [name], as I cannot reach other siblings, that all the patient's children are in consensus for comfort measures only and no aggressive treatment / all the patient's children are in agreement for hospice transfer. [name] says he has some POA paperwork that names him and his brother as her decision makers, was advised him to have a copy ready in case TOC/hospice needs that documentation / confirm is it HCPOA not financial POA.  12/16/2022-family accepted hospice-patient to be discharged to hospice home      Consultants:  Palliative Care      GI bleeding/Melanotic stool Significant iron deficiency anemia Pt not a candidate for invasive procedures or diagnostics given advanced age and frailty Comfort measures, d/c labs, no PRBC   Hypokalemia (severe) On admission, Potassium of 2.7 and magnesium of 1.6. Comfort measures, d/c labs   Unstageable chronic Sacral wound Seems to be a pressure injury, seems to be infected with undermining, blackening.   Wound care Reposition as toelrated for comfort  IV hydromorphone ordered for pain management --> po liquid morphine anticipating hospice facility cannot do drips    Essential (primary) hypertension Initially held BP meds due to hypotension D/c meds on comfort measures    Adult Failure to Thrive Consult to [facility] regarding APS findings and recommendations Palliative medicine consultation for goals of care discussions Comfort measures, d/c labs, abx, VS    DNR present  on admission Continue DNR order      Pertinent IV fluids/nutrition: diet as desired / as tolerated  Central lines / invasive devices: Foley cath given incontinence, sacral wound    Code Status: DNR ACP documentation reviewed: 12/14/22 none   Current  Admission Status: inpatient  TOC needs / Dispo plan: hospice facility  Barriers to discharge / significant pending items: hospice placement           Consultants: Palliative care/hospice  Disposition: Hospice care Diet recommendation:  Discharge Diet Orders (From admission, onward)     Start     Ordered   12/16/22 0000  Diet - low sodium heart healthy        12/16/22 1021           Regular diet DISCHARGE MEDICATION: Allergies as of 12/16/2022   No Known Allergies      Medication List     STOP taking these medications    ascorbic acid 500 MG tablet Commonly known as: VITAMIN C   cyanocobalamin 1000 MCG/ML injection Commonly known as: VITAMIN B12   famotidine 10 MG tablet Commonly known as: PEPCID   ferrous sulfate 325 (65 FE) MG tablet   furosemide 20 MG tablet Commonly known as: LASIX   multivitamin with minerals Tabs tablet   sodium bicarbonate 650 MG tablet   zinc sulfate 220 (50 Zn) MG capsule       TAKE these medications    calcitRIOL 0.25 MCG capsule Commonly known as: ROCALTROL Take 0.25 mcg by mouth daily.   diltiazem 240 MG 24 hr capsule Commonly known as: CARDIZEM CD Take 240 mg by mouth daily.   Gerhardt's butt cream Crea Apply 1 Application topically 3 (three) times daily as needed for irritation (apply to buttocks, groin, inner thighs, vaginal area with nystatin powder).   glycopyrrolate 1 MG tablet Commonly known as: ROBINUL Take 1 tablet (1 mg total) by mouth every 4 (four) hours as needed (excessive secretions).   hydrALAZINE 25 MG tablet Commonly known as: APRESOLINE Take 25 mg by mouth 2 (two) times daily.   nystatin powder Commonly known as: MYCOSTATIN/NYSTOP Apply topically 3 (three) times daily.   sucralfate 1 g tablet Commonly known as: CARAFATE Take 1 tablet (1 g total) by mouth 4 (four) times daily -  with meals and at bedtime.               Discharge Care Instructions  (From admission, onward)            Start     Ordered   12/16/22 0000  Discharge wound care:       Comments: Per nursing care   12/16/22 1021            Discharge Exam: Filed Weights   12/11/22 1308  Weight: 45.3 kg         General:  Lethargic  HEENT:  Normocephalic, PERRL, otherwise with in Normal limits   Neuro:  Manage exam patient is lethargic  Lungs:   Clear to auscultation BL, Respirations unlabored,  No wheezes / crackles  Cardio:    S1/S2, RRR, No murmure, No Rubs or Gallops   Abdomen:  Soft, non-tender, bowel sounds active all four quadrants, no guarding or peritoneal signs.  Muscular  skeletal:  Limited exam -global generalized weaknesses 2+ pulses,  symmetric,  Skin:  Dry, warm to touch, negative for any Rashes,  Wounds: Please see nursing documentation  Pressure Injury 10/21/22 Sacrum Left;Upper Unstageable - Full thickness tissue loss  in which the base of the injury is covered by slough (yellow, tan, gray, green or brown) and/or eschar (tan, brown or black) in the wound bed. nonbleeding    1 cm in dept (Active)  10/21/22 2025  Location: Sacrum  Location Orientation: Left;Upper  Staging: Unstageable - Full thickness tissue loss in which the base of the injury is covered by slough (yellow, tan, gray, green or brown) and/or eschar (tan, brown or black) in the wound bed.  Wound Description (Comments): nonbleeding    1 cm in depth  3.5 cmx 5 cm  Present on Admission: Yes  Dressing Type Foam - Lift dressing to assess site every shift 12/13/22 0830     Pressure Injury 12/11/22 Buttocks Unstageable - Full thickness tissue loss in which the base of the injury is covered by slough (yellow, tan, gray, green or brown) and/or eschar (tan, brown or black) in the wound bed. (Active)  12/11/22 1900  Location: Buttocks  Location Orientation:   Staging: Unstageable - Full thickness tissue loss in which the base of the injury is covered by slough (yellow, tan, gray, green or brown) and/or eschar  (tan, brown or black) in the wound bed.  Wound Description (Comments):   Present on Admission: Yes  Dressing Type Other (Comment) (not assessed at this time due to severe pain with interventions involving wound per family) 12/14/22 0800             Condition at discharge: poor  The results of significant diagnostics from this hospitalization (including imaging, microbiology, ancillary and laboratory) are listed below for reference.   Imaging Studies: No results found.  Microbiology: Results for orders placed or performed during the hospital encounter of 12/11/22  Urine Culture     Status: Abnormal   Collection Time: 12/11/22  3:00 PM   Specimen: Urine, Clean Catch  Result Value Ref Range Status   Specimen Description   Final    URINE, CLEAN CATCH Performed at [facility], [address] [place], [place] [address]    Special Requests   Final    NONE Performed at [facility], [address], [place], [place] [address]    Culture MULTIPLE SPECIES PRESENT, SUGGEST RECOLLECTION (A)  Final   Report Status 12/13/2022 FINAL  Final  Blood culture (routine x 2)     Status: None   Collection Time: 12/11/22  4:47 PM   Specimen: Right Antecubital; Blood  Result Value Ref Range Status   Specimen Description RIGHT ANTECUBITAL  Final   Special Requests   Final    BOTTLES DRAWN AEROBIC AND ANAEROBIC Blood Culture results may not be optimal due to an inadequate volume of blood received in culture bottles   Culture   Final    NO GROWTH 5 DAYS Performed at [facility], [address], [place], [place] [address]    Report Status 12/16/2022 FINAL  Final  Blood culture (routine x 2)     Status: None   Collection Time: 12/11/22  5:26 PM   Specimen: BLOOD  Result Value Ref Range Status   Specimen Description BLOOD LEFT ANTECUBITAL  Final   Special Requests   Final    BOTTLES DRAWN AEROBIC AND ANAEROBIC Blood Culture adequate volume   Culture   Final    NO GROWTH 5 DAYS Performed at  [facility], [address], [place], [place] [address]    Report Status 12/16/2022 FINAL  Final  MRSA Next Gen by PCR, Nasal     Status:  None   Collection Time: 12/11/22  9:43 PM   Specimen: Nasal Mucosa; Nasal Swab  Result Value Ref Range Status   MRSA by PCR Next Gen NOT DETECTED NOT DETECTED Final    Comment: (NOTE) The GeneXpert MRSA Assay (FDA approved for NASAL specimens only), is one component of a comprehensive MRSA colonization surveillance program. It is not intended to diagnose MRSA infection nor to guide or monitor treatment for MRSA infections. Test performance is not FDA approved in patients less than 29 years old. Performed at [facility], [address] [place], [place] [address]     Labs: CBC: Recent Labs  Lab 12/11/22 1509 12/11/22 2254 12/12/22 0515 12/13/22 0503 12/14/22 0438  WBC 17.4* 20.3* 21.1* 18.3* 19.5*  NEUTROABS 15.0*  --   --  15.6*  --   HGB 6.1* 7.3* 7.3* 7.1* 6.9*  HCT 19.2* 21.9* 22.6* 21.4* 21.1*  MCV 75.9* 79.1* 80.4 80.5 80.5  PLT 723* 609* 649* 675* 701*   Basic Metabolic Panel: Recent Labs  Lab 12/11/22 1509 12/12/22 0515 12/13/22 0503 12/14/22 0438  NA 133* 132* 132* 132*  K 2.0* 3.3* 2.7* 2.6*  CL 90* 95* 98 101  CO2 31 28 28 22   GLUCOSE 98 80 101* 115*  BUN 57* 50* 43* 36*  CREATININE 1.80* 1.63* 1.52* 1.43*  CALCIUM 8.6* 7.9* 7.6* 7.6*  MG 1.8 1.7 1.6*  --   PHOS 3.0  --   --   --    Liver Function Tests: Recent Labs  Lab 12/11/22 1509  AST 13*  ALT 11  ALKPHOS 66  BILITOT 0.4  PROT 5.1*  ALBUMIN 1.7*   CBG: Recent Labs  Lab 12/12/22 1144 12/12/22 1550  GLUCAP 70 100*    Discharge time spent: greater than 40 minutes.  Signed: Kendell Bane, MD [facility] 12/16/2022

## 2022-12-16 NOTE — Progress Notes (Signed)
Given ativan twice during night and morphine when needing changing this morning for bm. Gown, pads and dressing to sacrum changed with clean pads under arms for weeping.  Rested comfortably during night. Mouth care

## 2022-12-16 NOTE — Consult Note (Signed)
   [facility] Inpatient Consult   12/16/2022  [name] [birth year] [id number]  Primary Care Provider:  Carylon Perches  Patient is currently active with Care Management for chronic disease management services.  Patient has been engaged by a  Child psychotherapist.  Our community based plan of care has focused on disease management and community resource support.   Based upon the discharged disposition Liaison will collaborate with the involved social worker of the following plan for this pt.  Plan: Pt will discharged to the [facility] with Hospice services  Of note, Care Management services does not replace or interfere with any services that are needed or arranged by inpatient [facility] care management team.   For additional questions or referrals please contact:  Elliot Cousin, RN, [facility] Liaison [place]   Population Health Office Hours MTWF  8:00 am-6:00 pm Off on Thursday [phone] mobile [phone] [Office toll free line] Office Hours are M-F 8:30 - 5 pm [email or web address]

## 2022-12-16 NOTE — TOC Transition Note (Signed)
Transition of Care [facility]) - CM/SW Discharge Note   Patient Details  Name: [name] MRN: [id number] Date of Birth: 07/18/1918  Transition [facility] CM/SW Contact:  Elliot Gault, LCSW Phone Number: 12/16/2022, 10:58 AM   Clinical Narrative:     TOC updated by Hospice that pt's son has signed consents for [facility] and they can accept pt in transfer today.   DC clinical sent electronically. RN to call report. Hospice team arranging EMS.  No other TOC needs for dc.  Final next level of care: Hospice Medical Facility Barriers to Discharge: Barriers Resolved   Patient Goals and CMS Choice CMS Medicare.gov Compare Post Acute Care list provided to:: Patient Represenative (must comment) Choice offered to / list presented to : Adult Children  Discharge Placement                         Discharge Plan and Services Additional resources added to the After Visit Summary for                                       Social Determinants of Health (SDOH) Interventions SDOH Screenings   Food Insecurity: No Food Insecurity (11/02/2022)  Housing: Low Risk  (11/02/2022)  Transportation Needs: No Transportation Needs (11/02/2022)  Utilities: Not At Risk (11/02/2022)  Alcohol Screen: Low Risk  (09/17/2022)  Depression (PHQ2-9): Low Risk  (09/17/2022)  Financial Resource Strain: Low Risk  (09/17/2022)  Physical Activity: Insufficiently Active (09/17/2022)  Social Connections: Moderately Integrated (09/17/2022)  Stress: No Stress Concern Present (09/17/2022)  Tobacco Use: Low Risk  (12/12/2022)     Readmission Risk Interventions     No data to display

## 2022-12-16 NOTE — Care Management Important Message (Signed)
Important Message  Patient Details  Name: [name] MRN: [id number] Date of Birth: [birth year]   Medicare Important Message Given:  Yes     Corey Harold 12/16/2022, 11:42 AM

## 2023-01-01 ENCOUNTER — Ambulatory Visit: Payer: Self-pay | Admitting: *Deleted

## 2023-01-01 ENCOUNTER — Encounter: Payer: Self-pay | Admitting: *Deleted

## 2023-01-01 NOTE — Patient Outreach (Signed)
  Care Coordination   Initial Visit Note   01/01/2023  Name: [name] MRN: [id number] DOB: 12-16-1918  [name] is a 89 y.o. year old female who sees Miranda Perches, MD for primary care. I spoke with patient's son, [name] by phone today.  What matters to the patients health and wellness today?  Patient died on January 30, 2023.  SDOH assessments and interventions completed:  No.  Care Coordination Interventions:  No, not indicated.   Follow up plan: No further intervention required.   Encounter Outcome:  Patient Visit Completed.   [name], BSW, MSW, Printmaker Social Work Case Set designer Health  [facility], Population Health Direct Dial: [phone]  Fax: [phone] Email: [name]

## 2023-01-28 DEATH — deceased
